# Patient Record
Sex: Male | Born: 1970 | Race: White | Hispanic: No | Smoking: Current every day smoker
Health system: Southern US, Community
[De-identification: ages and names within clinical notes are randomized; demographics above are authoritative.]

## PROBLEM LIST (undated history)

## (undated) DIAGNOSIS — F419 Anxiety disorder, unspecified: Secondary | ICD-10-CM

## (undated) DIAGNOSIS — I2510 Atherosclerotic heart disease of native coronary artery without angina pectoris: Secondary | ICD-10-CM

## (undated) DIAGNOSIS — E785 Hyperlipidemia, unspecified: Secondary | ICD-10-CM

## (undated) DIAGNOSIS — I10 Essential (primary) hypertension: Secondary | ICD-10-CM

## (undated) DIAGNOSIS — K219 Gastro-esophageal reflux disease without esophagitis: Secondary | ICD-10-CM

## (undated) DIAGNOSIS — I219 Acute myocardial infarction, unspecified: Secondary | ICD-10-CM

## (undated) HISTORY — DX: Essential (primary) hypertension: I10

## (undated) HISTORY — DX: Hyperlipidemia, unspecified: E78.5

## (undated) HISTORY — DX: Acute myocardial infarction, unspecified: I21.9

## (undated) HISTORY — DX: Atherosclerotic heart disease of native coronary artery without angina pectoris: I25.10

## (undated) HISTORY — PX: CARDIAC CATHETERIZATION: SHX172

## (undated) HISTORY — PX: HEMORRHOID SURGERY: SHX153

---

## 1997-12-23 ENCOUNTER — Ambulatory Visit (HOSPITAL_COMMUNITY): Admission: RE | Admit: 1997-12-23 | Discharge: 1997-12-23 | Payer: Self-pay | Admitting: Surgery

## 2001-07-21 ENCOUNTER — Ambulatory Visit (HOSPITAL_COMMUNITY): Admission: RE | Admit: 2001-07-21 | Discharge: 2001-07-21 | Payer: Self-pay | Admitting: Family Medicine

## 2004-06-22 ENCOUNTER — Ambulatory Visit: Payer: Self-pay | Admitting: Family Medicine

## 2004-06-23 ENCOUNTER — Ambulatory Visit: Payer: Self-pay | Admitting: Family Medicine

## 2004-11-06 ENCOUNTER — Ambulatory Visit: Payer: Self-pay | Admitting: Family Medicine

## 2004-12-14 ENCOUNTER — Ambulatory Visit: Payer: Self-pay | Admitting: Family Medicine

## 2004-12-29 ENCOUNTER — Ambulatory Visit: Payer: Self-pay

## 2005-04-16 ENCOUNTER — Ambulatory Visit: Payer: Self-pay | Admitting: Internal Medicine

## 2005-05-06 ENCOUNTER — Ambulatory Visit: Payer: Self-pay | Admitting: Family Medicine

## 2005-05-31 ENCOUNTER — Ambulatory Visit: Payer: Self-pay | Admitting: Family Medicine

## 2005-06-07 ENCOUNTER — Ambulatory Visit: Payer: Self-pay | Admitting: Gastroenterology

## 2005-06-28 ENCOUNTER — Ambulatory Visit: Payer: Self-pay | Admitting: Gastroenterology

## 2005-06-28 ENCOUNTER — Ambulatory Visit (HOSPITAL_COMMUNITY): Admission: RE | Admit: 2005-06-28 | Discharge: 2005-06-28 | Payer: Self-pay | Admitting: Gastroenterology

## 2005-08-18 ENCOUNTER — Ambulatory Visit: Payer: Self-pay | Admitting: Gastroenterology

## 2005-10-07 ENCOUNTER — Ambulatory Visit: Payer: Self-pay | Admitting: Family Medicine

## 2005-10-21 ENCOUNTER — Ambulatory Visit: Payer: Self-pay | Admitting: Family Medicine

## 2005-11-11 ENCOUNTER — Ambulatory Visit: Payer: Self-pay | Admitting: Family Medicine

## 2005-12-21 ENCOUNTER — Ambulatory Visit: Payer: Self-pay | Admitting: Family Medicine

## 2006-01-11 ENCOUNTER — Ambulatory Visit: Payer: Self-pay | Admitting: Family Medicine

## 2006-02-01 ENCOUNTER — Ambulatory Visit: Payer: Self-pay | Admitting: Family Medicine

## 2006-05-23 ENCOUNTER — Ambulatory Visit: Payer: Self-pay | Admitting: Family Medicine

## 2006-05-23 LAB — CONVERTED CEMR LAB
Alkaline Phosphatase: 70 units/L (ref 39–117)
BUN: 11 mg/dL (ref 6–23)
CO2: 29 meq/L (ref 19–32)
Calcium: 9.6 mg/dL (ref 8.4–10.5)
Cholesterol: 217 mg/dL (ref 0–200)
Direct LDL: 138.2 mg/dL
GFR calc non Af Amer: 73 mL/min
HDL: 44.1 mg/dL (ref >39.0)
Total Protein: 6.5 g/dL (ref 6.0–8.3)
Triglycerides: 264 mg/dL (ref 0–149)

## 2006-12-05 ENCOUNTER — Encounter: Payer: Self-pay | Admitting: Family Medicine

## 2007-08-29 ENCOUNTER — Telehealth (INDEPENDENT_AMBULATORY_CARE_PROVIDER_SITE_OTHER): Payer: Self-pay | Admitting: *Deleted

## 2008-01-16 ENCOUNTER — Telehealth (INDEPENDENT_AMBULATORY_CARE_PROVIDER_SITE_OTHER): Payer: Self-pay | Admitting: *Deleted

## 2008-01-31 ENCOUNTER — Telehealth (INDEPENDENT_AMBULATORY_CARE_PROVIDER_SITE_OTHER): Payer: Self-pay | Admitting: *Deleted

## 2008-02-13 ENCOUNTER — Encounter: Payer: Self-pay | Admitting: Family Medicine

## 2008-02-14 ENCOUNTER — Telehealth (INDEPENDENT_AMBULATORY_CARE_PROVIDER_SITE_OTHER): Payer: Self-pay | Admitting: *Deleted

## 2009-08-28 ENCOUNTER — Encounter: Payer: Self-pay | Admitting: Gastroenterology

## 2010-06-02 NOTE — Procedures (Signed)
Summary: Endoscopic Ultrasound  Patient: [name] Note: All result statuses are Final unless otherwise noted.  Tests: (1) Endoscopic Ultrasound (EUS)  EUS Endoscopic Ultrasound                             DONE     [facility]     [address] [place], [place]  [address]           ENDOSCOPIC ULTRASOUND PROCEDURE REPORT           PATIENT:  Zachary [name], Zachary [name]  MR#:  [id number]     BIRTHDATE:  07/17/1952  GENDER:  male     ENDOSCOPIST:  Rachael Fee, MD     REFERRED BY:  Juline Patch, M.D.           PROCEDURE DATE:  08/28/2009     PROCEDURE:  Upper EUS     ASA CLASS:  Class II     INDICATIONS:  RUQ pain, dilated CBD on recent US, single small     stone in GB, normal LFTs           MEDICATIONS:   Fentanyl 75 mcg IV, Versed 10 mg IV           DESCRIPTION OF PROCEDURE:   After the risks, benefits, and     alternatives of the procedure were thoroughly explained, informed     consent was obtained.  The  endoscope was introduced through the     mouth  and advanced to the duodenum.     <<PROCEDUREIMAGES>>           Endoscopic findings (with radial echoendoscope and side viewing     duodenoscope):     1. Normal esophagus     2. Normal stomach     3. 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           EUS findings:     1. Dilated but otherwise normal appearing CBD that tapered     smoothly into the head of pancreas.  Maximal dilation 9.58mm on     this exam. There were no stones in bile duct.     2. Normal, non-dilated main pancreatic duct.     3. Pancreatic parenchyma was normal in head, neck, body and tail     without masses or signs of chronic pancreatitis.     4. No peripancreatic or celiac adenopathy.     5. Normal gallbladder.     6. Limited views of liver, spleen, portal and splenic vessels were     all  normal.           Impression:     Dilated, but otherwise normal CBD without stones.  No masses in     pancreas. 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 These could perhaps be causing the     extrahepatic biliary dilation.  Given normal liver tests and lack     of CBD stones, I did not proceed with ERCP.  She does have     intermittent RUQ pains and has a stone in gallbladder on  recent     abdominal ultrasound and so I will arrange for general surgery     evaluation to consider cholecystectomy.           ______________________________     Rachael Fee, MD           n.     eSIGNED:   Rachael Fee at 08/28/2009 11:55 AM           Bud Face, [id number]  Note: An exclamation mark (!) indicates a result that was not dispersed into the flowsheet. Document Creation Date: 08/28/2009 2:58 PM _______________________________________________________________________  (1) Order result status: Final Collection or observation date-time: 08/28/2009 11:43 Requested date-time:  Receipt date-time:  Reported date-time:  Referring Physician:   Ordering Physician: Rob Bunting [phone]) Specimen Source:  Source: Launa Grill Order Number: [id number] Lab site:

## 2013-04-27 ENCOUNTER — Emergency Department: Payer: Self-pay | Admitting: Emergency Medicine

## 2014-09-06 ENCOUNTER — Other Ambulatory Visit: Payer: Self-pay

## 2014-09-06 ENCOUNTER — Encounter: Payer: Self-pay | Admitting: *Deleted

## 2014-09-09 ENCOUNTER — Encounter: Payer: Self-pay | Admitting: *Deleted

## 2014-09-09 DIAGNOSIS — Z79899 Other long term (current) drug therapy: Secondary | ICD-10-CM | POA: Diagnosis not present

## 2014-09-09 DIAGNOSIS — K219 Gastro-esophageal reflux disease without esophagitis: Secondary | ICD-10-CM | POA: Diagnosis not present

## 2014-09-09 DIAGNOSIS — K6289 Other specified diseases of anus and rectum: Secondary | ICD-10-CM | POA: Diagnosis not present

## 2014-09-09 DIAGNOSIS — K644 Residual hemorrhoidal skin tags: Secondary | ICD-10-CM | POA: Diagnosis present

## 2014-09-09 DIAGNOSIS — K648 Other hemorrhoids: Secondary | ICD-10-CM | POA: Diagnosis present

## 2014-09-09 DIAGNOSIS — F329 Major depressive disorder, single episode, unspecified: Secondary | ICD-10-CM | POA: Diagnosis not present

## 2014-09-09 DIAGNOSIS — K625 Hemorrhage of anus and rectum: Secondary | ICD-10-CM | POA: Diagnosis not present

## 2014-09-10 ENCOUNTER — Encounter
Admission: RE | Admit: 2014-09-10 | Discharge: 2014-09-10 | Disposition: A | Discharge status: Home / Self Care | Payer: BLUE CROSS/BLUE SHIELD | Source: Ambulatory Visit | Attending: Surgery | Admitting: Surgery

## 2014-09-10 DIAGNOSIS — K644 Residual hemorrhoidal skin tags: Secondary | ICD-10-CM | POA: Diagnosis not present

## 2014-09-10 LAB — DIFFERENTIAL
Basophils Absolute: 0.1 10*3/uL (ref 0–0.1)
Basophils Relative: 1 %
EOS ABS: 0.1 10*3/uL (ref 0–0.7)
Eosinophils Relative: 1 %
LYMPHS ABS: 2.2 10*3/uL (ref 1.0–3.6)
LYMPHS PCT: 27 %
MONOS PCT: 8 %
Monocytes Absolute: 0.6 10*3/uL (ref 0.2–1.0)
NEUTROS ABS: 5.3 10*3/uL (ref 1.4–6.5)
NEUTROS PCT: 63 %

## 2014-09-10 LAB — BASIC METABOLIC PANEL
ANION GAP: 8 (ref 5–15)
BUN: 16 mg/dL (ref 6–20)
CHLORIDE: 105 mmol/L (ref 101–111)
CO2: 28 mmol/L (ref 22–32)
CREATININE: 1.12 mg/dL (ref 0.61–1.24)
Calcium: 9.5 mg/dL (ref 8.9–10.3)
GFR calc Af Amer: >60 mL/min (ref >60)
GFR calc non Af Amer: >60 mL/min (ref >60)
GLUCOSE: 109 mg/dL — AB (ref 65–99)
Potassium: 4.2 mmol/L (ref 3.5–5.1)
Sodium: 141 mmol/L (ref 135–145)

## 2014-09-10 LAB — CBC
HEMATOCRIT: 39.8 % — AB (ref 40.0–52.0)
Hemoglobin: 12.9 g/dL — ABNORMAL LOW (ref 13.0–18.0)
MCH: 26.7 pg (ref 26.0–34.0)
MCHC: 32.5 g/dL (ref 32.0–36.0)
MCV: 82 fL (ref 80.0–100.0)
Platelets: 278 10*3/uL (ref 150–440)
RBC: 4.85 MIL/uL (ref 4.40–5.90)
RDW: 16.1 % — ABNORMAL HIGH (ref 11.5–14.5)
WBC: 8.3 10*3/uL (ref 3.8–10.6)

## 2014-09-13 ENCOUNTER — Ambulatory Visit: Payer: BLUE CROSS/BLUE SHIELD | Admitting: Anesthesiology

## 2014-09-13 ENCOUNTER — Encounter
Admission: RE | Disposition: A | Discharge status: Home / Self Care | Payer: Self-pay | Source: Ambulatory Visit | Attending: Surgery

## 2014-09-13 ENCOUNTER — Ambulatory Visit
Admission: RE | Admit: 2014-09-13 | Discharge: 2014-09-13 | Disposition: A | Discharge status: Home / Self Care | Payer: BLUE CROSS/BLUE SHIELD | Source: Ambulatory Visit | Attending: Surgery | Admitting: Surgery

## 2014-09-13 DIAGNOSIS — K644 Residual hemorrhoidal skin tags: Secondary | ICD-10-CM | POA: Diagnosis not present

## 2014-09-13 DIAGNOSIS — K219 Gastro-esophageal reflux disease without esophagitis: Secondary | ICD-10-CM | POA: Insufficient documentation

## 2014-09-13 DIAGNOSIS — K6289 Other specified diseases of anus and rectum: Secondary | ICD-10-CM | POA: Insufficient documentation

## 2014-09-13 DIAGNOSIS — K625 Hemorrhage of anus and rectum: Secondary | ICD-10-CM | POA: Insufficient documentation

## 2014-09-13 DIAGNOSIS — Z79899 Other long term (current) drug therapy: Secondary | ICD-10-CM | POA: Insufficient documentation

## 2014-09-13 DIAGNOSIS — F329 Major depressive disorder, single episode, unspecified: Secondary | ICD-10-CM | POA: Insufficient documentation

## 2014-09-13 DIAGNOSIS — K648 Other hemorrhoids: Secondary | ICD-10-CM | POA: Insufficient documentation

## 2014-09-13 HISTORY — PX: HEMORRHOID SURGERY: SHX153

## 2014-09-13 HISTORY — DX: Gastro-esophageal reflux disease without esophagitis: K21.9

## 2014-09-13 HISTORY — DX: Anxiety disorder, unspecified: F41.9

## 2014-09-13 SURGERY — HEMORRHOIDECTOMY
Anesthesia: General | Wound class: Contaminated

## 2014-09-13 MED ORDER — FENTANYL CITRATE (PF) 100 MCG/2ML IJ SOLN
INTRAMUSCULAR | Status: AC
  Administered 2014-09-13: 25 ug via INTRAVENOUS
  Filled 2014-09-13: qty 2

## 2014-09-13 MED ORDER — BUPIVACAINE-EPINEPHRINE (PF) 0.5% -1:200000 IJ SOLN
INTRAMUSCULAR | Status: DC | PRN
  Administered 2014-09-13: 30 mL

## 2014-09-13 MED ORDER — PROPOFOL 10 MG/ML IV BOLUS
INTRAVENOUS | Status: DC | PRN
  Administered 2014-09-13: 200 mg via INTRAVENOUS

## 2014-09-13 MED ORDER — FENTANYL CITRATE (PF) 100 MCG/2ML IJ SOLN
25.0000 ug | INTRAMUSCULAR | Status: AC | PRN
  Administered 2014-09-13 (×4): 25 ug via INTRAVENOUS

## 2014-09-13 MED ORDER — SODIUM CHLORIDE 0.9 % IJ SOLN
INTRAMUSCULAR | Status: AC
  Filled 2014-09-13: qty 10

## 2014-09-13 MED ORDER — HYDROMORPHONE HCL 1 MG/ML IJ SOLN
INTRAMUSCULAR | Status: AC
  Administered 2014-09-13: 0.5 mg via INTRAVENOUS
  Filled 2014-09-13: qty 1

## 2014-09-13 MED ORDER — BUPIVACAINE LIPOSOME 1.3 % IJ SUSP
INTRAMUSCULAR | Status: DC | PRN
  Administered 2014-09-13: 20 mL

## 2014-09-13 MED ORDER — LACTATED RINGERS IV SOLN
INTRAVENOUS | Status: DC
  Administered 2014-09-13 (×2): via INTRAVENOUS

## 2014-09-13 MED ORDER — PROMETHAZINE HCL 25 MG/ML IJ SOLN
INTRAMUSCULAR | Status: AC
Start: 2014-09-13 — End: 2014-09-13
  Administered 2014-09-13: 6.25 mg via INTRAVENOUS
  Filled 2014-09-13: qty 1

## 2014-09-13 MED ORDER — HYDROMORPHONE HCL 1 MG/ML IJ SOLN
0.2500 mg | INTRAMUSCULAR | Status: DC | PRN
  Administered 2014-09-13 (×4): 0.5 mg via INTRAVENOUS

## 2014-09-13 MED ORDER — ONDANSETRON HCL 4 MG/2ML IJ SOLN: 4.0000 mg | Freq: Once | INTRAMUSCULAR | Status: DC | PRN

## 2014-09-13 MED ORDER — PROMETHAZINE HCL 25 MG/ML IJ SOLN
INTRAMUSCULAR | Status: AC
  Filled 2014-09-13: qty 1

## 2014-09-13 MED ORDER — PROMETHAZINE HCL 25 MG/ML IJ SOLN
6.2500 mg | Freq: Once | INTRAMUSCULAR | Status: AC
  Administered 2014-09-13 (×2): 6.25 mg via INTRAVENOUS

## 2014-09-13 MED ORDER — GLYCOPYRROLATE 0.2 MG/ML IJ SOLN
INTRAMUSCULAR | Status: DC | PRN
  Administered 2014-09-13: 0.2 mg via INTRAVENOUS

## 2014-09-13 MED ORDER — LIDOCAINE HCL (CARDIAC) 20 MG/ML IV SOLN
INTRAVENOUS | Status: DC | PRN
  Administered 2014-09-13: 60 mg via INTRAVENOUS

## 2014-09-13 MED ORDER — OXYCODONE-ACETAMINOPHEN 5-325 MG PO TABS: 1.0000 | ORAL_TABLET | ORAL | Status: DC | PRN

## 2014-09-13 MED ORDER — ONDANSETRON HCL 4 MG/2ML IJ SOLN
INTRAMUSCULAR | Status: DC | PRN
  Administered 2014-09-13: 4 mg via INTRAVENOUS

## 2014-09-13 MED ORDER — ONDANSETRON HCL 4 MG/2ML IJ SOLN
INTRAMUSCULAR | Status: AC
  Filled 2014-09-13: qty 2

## 2014-09-13 MED ORDER — FENTANYL CITRATE (PF) 100 MCG/2ML IJ SOLN
INTRAMUSCULAR | Status: DC | PRN
  Administered 2014-09-13: 50 ug via INTRAVENOUS
  Administered 2014-09-13 (×2): 25 ug via INTRAVENOUS

## 2014-09-13 MED ORDER — BUPIVACAINE LIPOSOME 1.3 % IJ SUSP
INTRAMUSCULAR | Status: AC
  Filled 2014-09-13: qty 20

## 2014-09-13 MED ORDER — BUPIVACAINE-EPINEPHRINE (PF) 0.5% -1:200000 IJ SOLN
INTRAMUSCULAR | Status: AC
  Filled 2014-09-13: qty 30

## 2014-09-13 MED ORDER — MIDAZOLAM HCL 2 MG/2ML IJ SOLN
INTRAMUSCULAR | Status: DC | PRN
Start: 2014-09-13 — End: 2014-09-13
  Administered 2014-09-13: 2 mg via INTRAVENOUS

## 2014-09-13 SURGICAL SUPPLY — 30 items
BLADE SURG 15 STRL LF DISP TIS (BLADE) ×1 IMPLANT
BLADE SURG 15 STRL SS (BLADE) ×2
CANISTER SUCT 1200ML W/VALVE (MISCELLANEOUS) ×3 IMPLANT
CNTNR SPEC 2.5X3XGRAD LEK (MISCELLANEOUS) ×1
CONT SPEC 4OZ STER OR WHT (MISCELLANEOUS) ×2
CONTAINER SPEC 2.5X3XGRAD LEK (MISCELLANEOUS) ×1 IMPLANT
DRAPE LAPAROTOMY 100X77 ABD (DRAPES) ×3 IMPLANT
DRAPE LEGGINS SURG 28X43 STRL (DRAPES) ×3 IMPLANT
DRAPE UNDER BUTTOCK W/FLU (DRAPES) ×3 IMPLANT
GAUZE SPONGE 4X4 12PLY STRL (GAUZE/BANDAGES/DRESSINGS) ×3 IMPLANT
GLOVE BIO SURGEON STRL SZ7.5 (GLOVE) ×9 IMPLANT
GOWN STRL REUS W/ TWL LRG LVL3 (GOWN DISPOSABLE) ×2 IMPLANT
GOWN STRL REUS W/TWL LRG LVL3 (GOWN DISPOSABLE) ×4
HARMONIC SCALPEL FOCUS (MISCELLANEOUS) ×3 IMPLANT
JELLY LUB 2OZ STRL (MISCELLANEOUS) ×2
JELLY LUBE 2OZ STRL (MISCELLANEOUS) ×1 IMPLANT
LABEL OR SOLS (LABEL) ×3 IMPLANT
NDL SAFETY 25GX1.5 (NEEDLE) ×3 IMPLANT
NS IRRIG 500ML POUR BTL (IV SOLUTION) ×3 IMPLANT
PACK BASIN MINOR ARMC (MISCELLANEOUS) ×3 IMPLANT
PAD GROUND ADULT SPLIT (MISCELLANEOUS) ×3 IMPLANT
PAD PREP 24X41 OB/GYN DISP (PERSONAL CARE ITEMS) ×3 IMPLANT
SOL PREP PVP 2OZ (MISCELLANEOUS) ×3
SOLUTION PREP PVP 2OZ (MISCELLANEOUS) ×1 IMPLANT
STAPLER PROXIMATE HCS (STAPLE) IMPLANT
STRAP SAFETY BODY (MISCELLANEOUS) ×3 IMPLANT
SUT CHROMIC 2 0 SH (SUTURE) ×9 IMPLANT
SUT CHROMIC 3 0 SH 27 (SUTURE) ×3 IMPLANT
SYRINGE 10CC LL (SYRINGE) ×3 IMPLANT
TAPE CLOTH SOFT 2X10 (GAUZE/BANDAGES/DRESSINGS) ×3 IMPLANT

## 2014-09-13 NOTE — Anesthesia Preprocedure Evaluation (Signed)
Anesthesia Evaluation  Patient identified by MRN, date of birth, ID band Patient awake    Reviewed: Allergy & Precautions, H&P , NPO status   Airway Mallampati: III  TM Distance: >3 FB     Dental  (+) Chipped   Pulmonary          Cardiovascular     Neuro/Psych    GI/Hepatic GERD-  ,  Endo/Other  Morbid obesity  Renal/GU      Musculoskeletal   Abdominal   Peds  Hematology   Anesthesia Other Findings   Reproductive/Obstetrics                             Anesthesia Physical Anesthesia Plan  ASA: III  Anesthesia Plan: General   Post-op Pain Management:    Induction: Intravenous  Airway Management Planned: LMA  Additional Equipment:   Intra-op Plan:   Post-operative Plan:   Informed Consent: I have reviewed the patients History and Physical, chart, labs and discussed the procedure including the risks, benefits and alternatives for the proposed anesthesia with the patient or authorized representative who has indicated his/her understanding and acceptance.     Plan Discussed with: CRNA  Anesthesia Plan Comments:         Anesthesia Quick Evaluation

## 2014-09-13 NOTE — Transfer of Care (Signed)
Immediate Anesthesia Transfer of Care Note  Patient: Zachary [name]  Procedure(s) Performed: Procedure(s): HEMORRHOIDECTOMY (N/A)  Patient Location: PACU  Anesthesia Type:General  Level of Consciousness: awake, alert  and oriented  Airway & Oxygen Therapy: Patient Spontanous Breathing  Post-op Assessment: Report given to RN  Post vital signs: stable  Last Vitals:  Filed Vitals:   09/13/14 1146  BP: 121/86  Pulse: 85  Temp: 36.4 C  Resp: 11    Complications: No apparent anesthesia complications

## 2014-09-13 NOTE — Anesthesia Procedure Notes (Signed)
Procedure Name: LMA Insertion Performed by: Breckyn Troyer Pre-anesthesia Checklist: Patient identified Oxygen Delivery Method: Circle system utilized Preoxygenation: Pre-oxygenation with 100% oxygen Intubation Type: IV induction Ventilation: Mask ventilation without difficulty LMA Size: 4.5 Number of attempts: 1 Placement Confirmation: positive ETCO2 and breath sounds checked- equal and bilateral Tube secured with: Tape

## 2014-09-13 NOTE — Op Note (Signed)
Operative report  Preoperative diagnosis internal and external hemorrhoids  Postoperative diagnosis internal and external hemorrhoids  Procedure internal and external hemorrhoidectomy  Anesthesia General  Surgeon Renda RollsWilton Kayra Crowell M.D.  Indications: This 44 year old male has a history of anal pain and rectal bleeding. Large internal and external hemorrhoids were demonstrated on physical exam and surgery was recommended for definitive treatment  The patient was placed on the operating table in the supine position under general anesthesia. The legs were elevated into the lithotomy position using ankle straps. The scrotum was retracted with paper tape. Anal area was prepared with Betadine solution and draped in a sterile manner  Large external hemorrhoids were identified at the 4:00 and 8:00 and 11:00 positions. The anoderm was infiltrated with half percent Sensorcaine with epinephrine. Deeper tissues surrounding the sphincter were also infiltrated. The anal canal was dilated large enough to admit 4 fingers. The bivalve anal retractor was introduced. There was also findings of very large internal hemorrhoids at the 4:00 and 8:00 and 11:00 positions.  First at the 4:00 position a high ligation of the internal component was done with a 2-0 chromic suture ligature. A V-shaped incision was made externally and dissection was begun with electrocautery. Further dissection was carried out with the Harmonic scalpel dissecting up over the internal anal sphincter up to the previously placed suture ligature. The hemorrhoid was further ligated with the same suture ligature and excised. The wound was inspected and several small bleeding points cauterized. Wound closure was carried out with a running 2-0 chromic locked and tied  stitch leaving a small opening externally for drainage.  Next a similar procedure was carried out at the 8:00 position with a similar 2-0 chromic suture ligature a similar excision with the  scalpel cautery and Harmonic scalpel. The hemorrhoid was further ligated and excised. The wound was closed in a similar manner leaving a small opening externally for drainage.  A similar procedure was carried out at the 11:00 position with a similar dissection and ligation and closure.  Following this it appeared that hemostasis was intact. The anal retractor was removed and the anal area was prepared with Betadine solution. The skin and subcutaneous tissues were infiltrated with 20 cc of Exparel. Deeper tissues surrounding the sphincter were also infiltrated.Dressings were applied with paper tape  The patient appeared to be in satisfactory condition and was prepared for transfer to the recovery room    Renda RollsWilton Kaydee Magel M.D.

## 2014-09-13 NOTE — Discharge Instructions (Addendum)
Tylenol 325 mg 1-2 each 4 hours as needed for minor pain.  Take Percocet 1-2 each 4 hours as needed for more pain.  Remove dressing tomorrow. May then shower and/or sitting in warm water.  Tuck gauze or pad in underwear as needed for drainage  Gradually increase activities. Should not drive when taking Percocet.  Take MiraLAX daily

## 2014-09-13 NOTE — Transfer of Care (Signed)
Immediate Anesthesia Transfer of Care Note  Patient: Zachary [name]  Procedure(s) Performed: Procedure(s): HEMORRHOIDECTOMY (N/A)  Patient Location: PACU  Anesthesia Type:General  Level of Consciousness: awake, alert  and oriented  Airway & Oxygen Therapy: Patient Spontanous Breathing  Post-op Assessment: Report given to RN  Post vital signs: stable  Last Vitals:  Filed Vitals:   09/13/14 0918  BP: 147/75  Pulse: 110  Temp: 36.4 C  Resp:     Complications: No apparent anesthesia complications

## 2014-09-13 NOTE — H&P (Signed)
  09/13/2014  Since the office visit he has taken lisinopril. He did develop a cough and switched to another blood pressure medicine 2 days ago.  His blood pressure today is 147/75.  He reports no other changes in condition since the office visit  Plan is for hemorrhoidectomy today.

## 2014-09-13 NOTE — Anesthesia Postprocedure Evaluation (Signed)
  Anesthesia Post-op Note  Patient: Zachary [name]  Procedure(s) Performed: Procedure(s): HEMORRHOIDECTOMY (N/A)  Anesthesia type:General  Patient location: PACU  Post pain: Pain level controlled  Post assessment: Post-op Vital signs reviewed, Patient's Cardiovascular Status Stable, Respiratory Function Stable, Patent Airway and No signs of Nausea or vomiting  Post vital signs: Reviewed and stable  Last Vitals:  Filed Vitals:   09/13/14 1248  BP: 132/87  Pulse: 61  Temp: 36.7 C  Resp: 13    Level of consciousness: awake, alert  and patient cooperative  Complications: No apparent anesthesia complications

## 2014-09-15 ENCOUNTER — Encounter: Payer: Self-pay | Admitting: Surgery

## 2014-09-16 LAB — SURGICAL PATHOLOGY

## 2015-04-20 IMAGING — CR RIGHT FOOT COMPLETE - 3+ VIEW
1 series · 3 of 3 positions shown · non-contrast
Comparison: None.

CLINICAL DATA: No known injury.  Right foot pain.

EXAM:
RIGHT FOOT COMPLETE - 3+ VIEW

[Series 1: x foot ap right · 0.14mm/px · 3 of 3 slices shown]
[im 1/3]
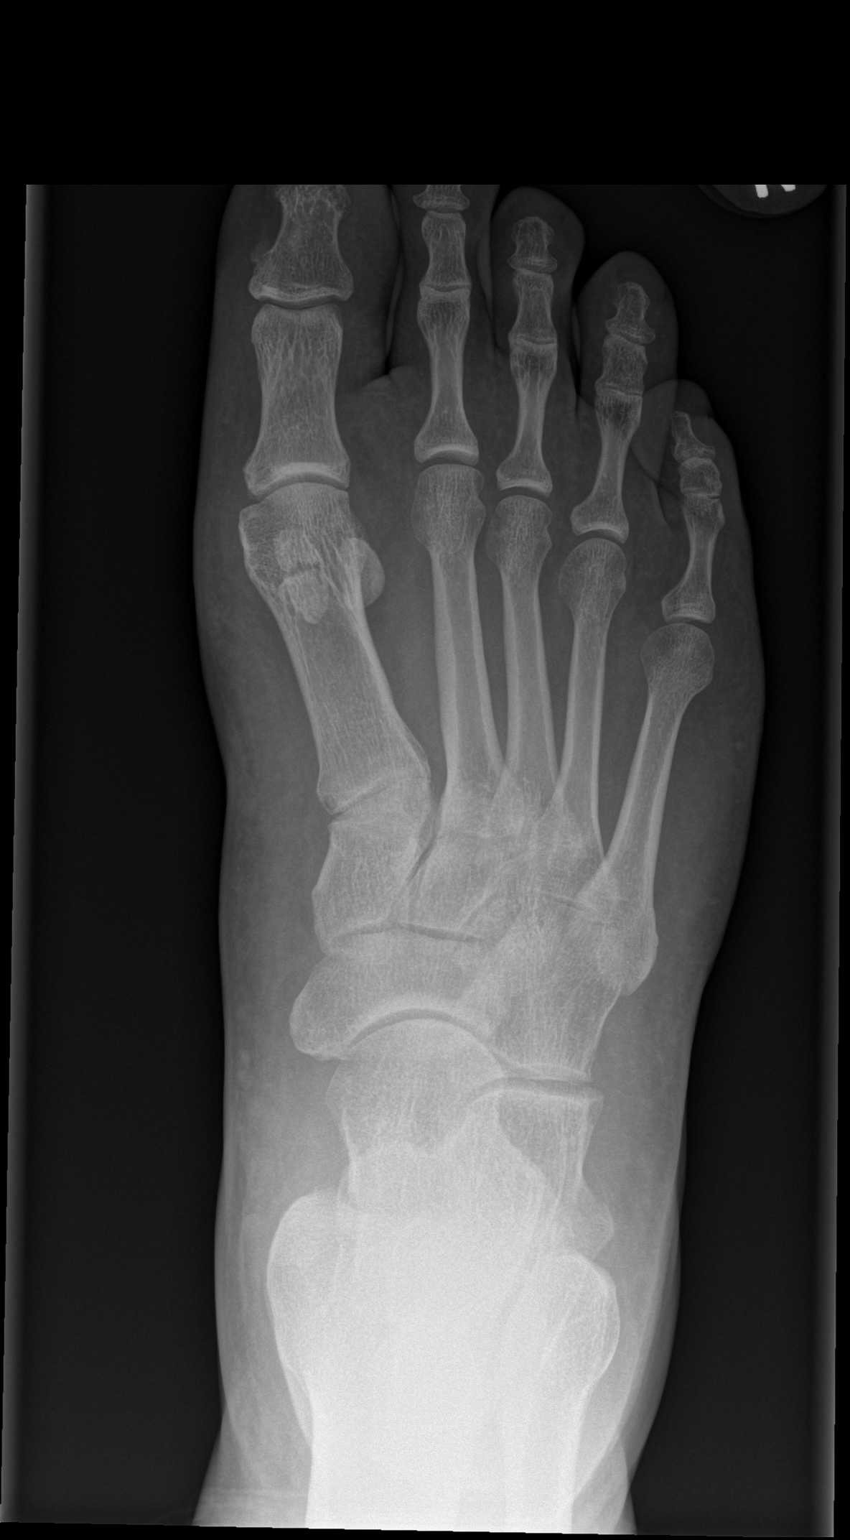
[im 2/3]
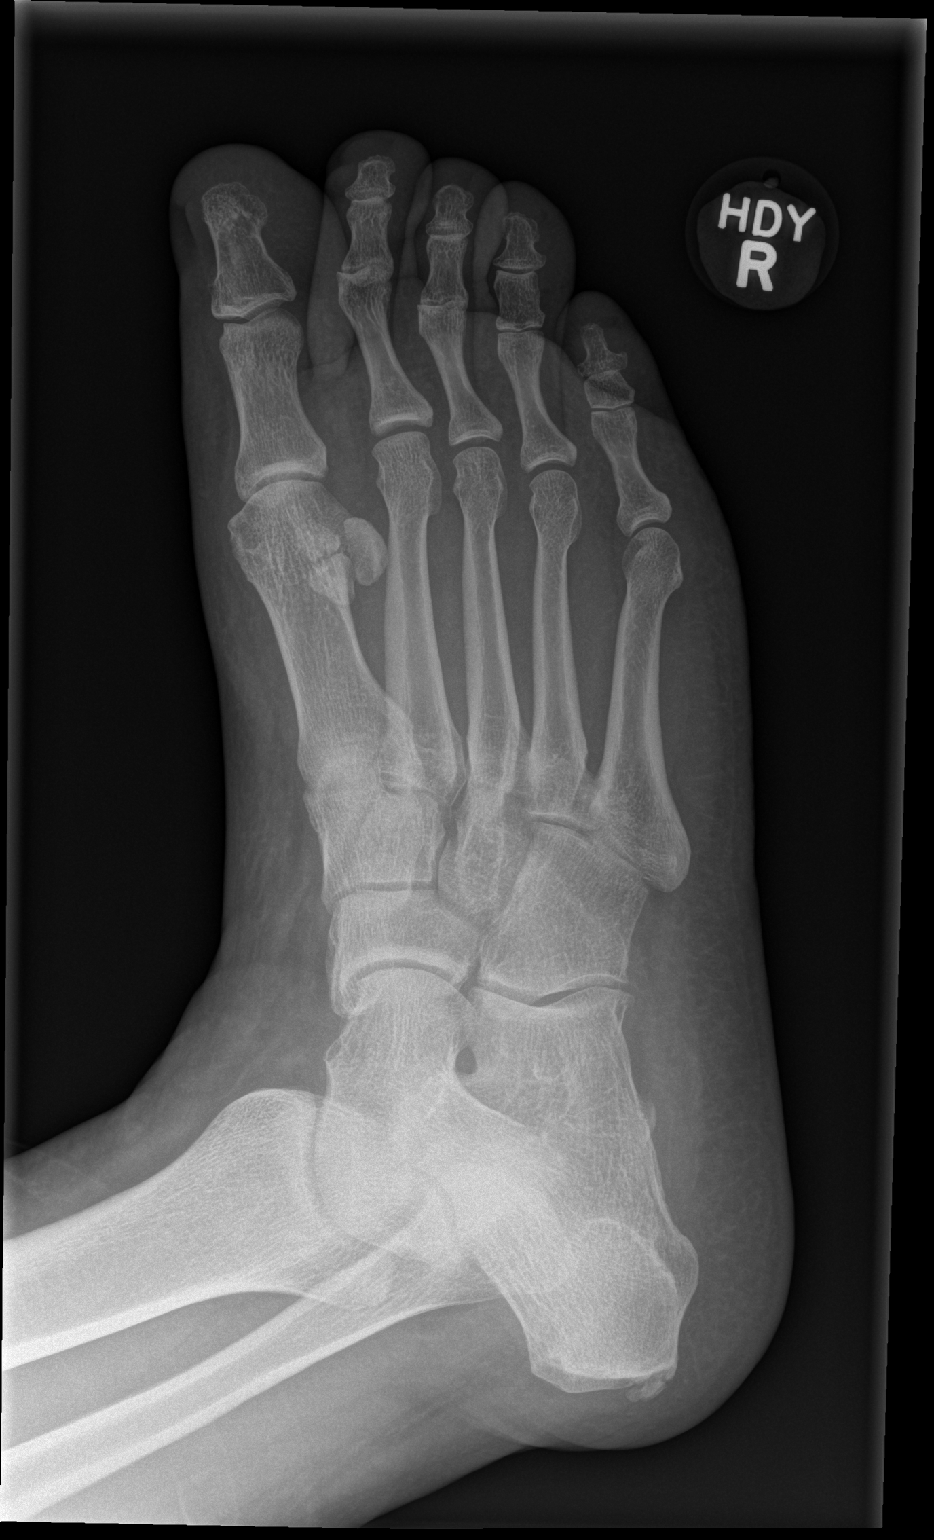
[im 3/3]
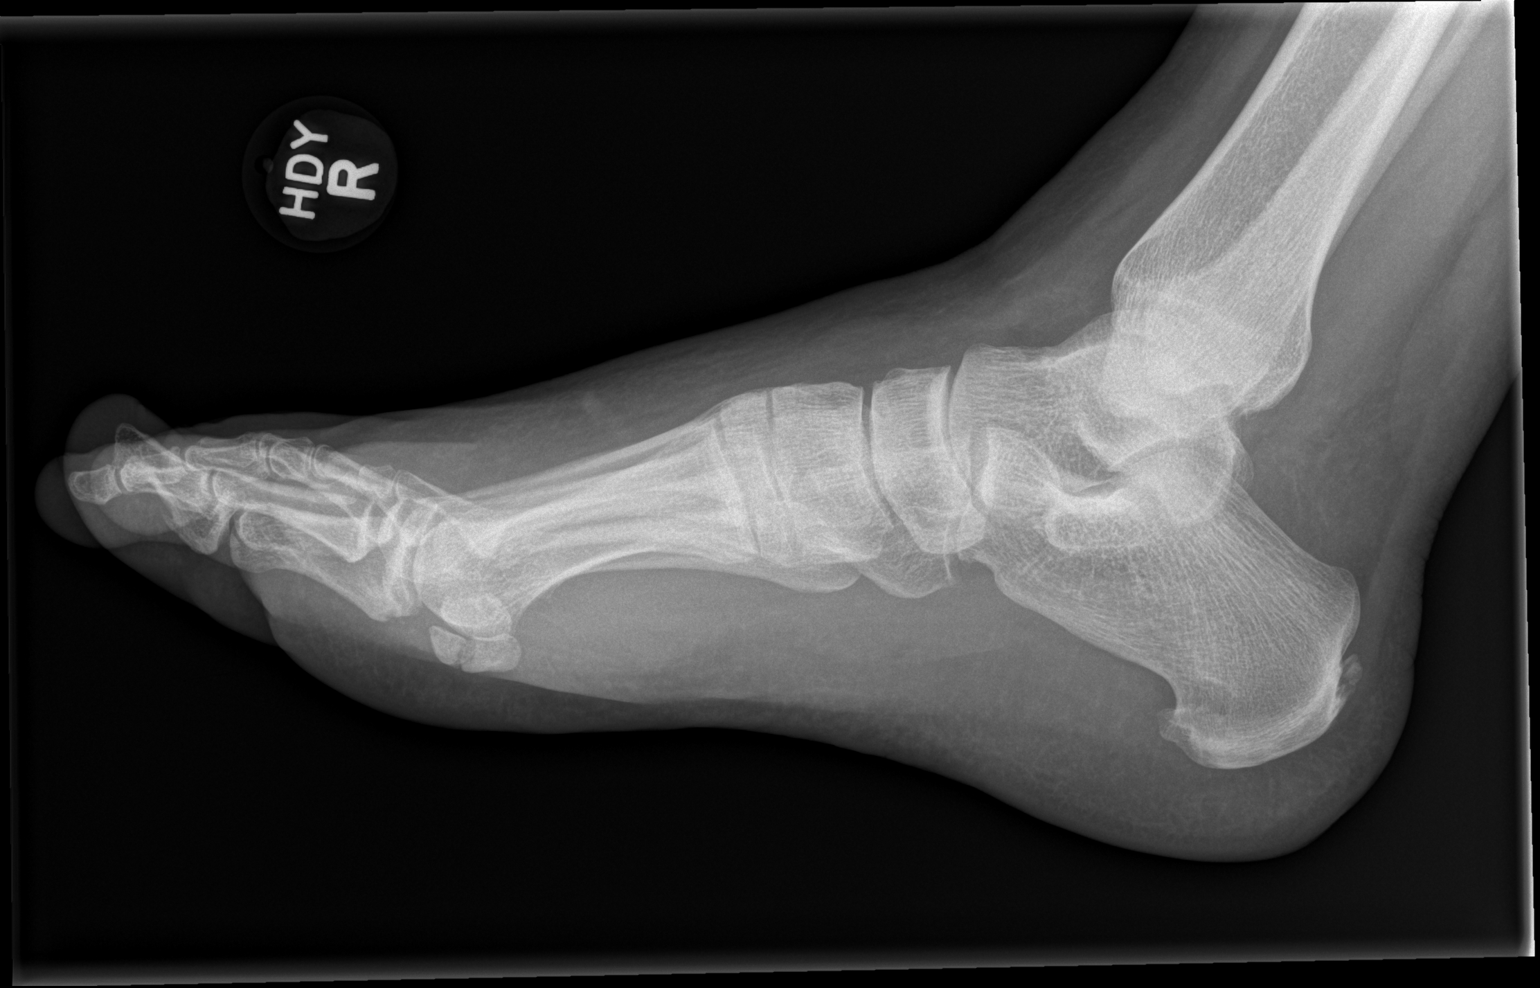

[3 of 3 positions shown; findings below may reference images not displayed]

FINDINGS: Mild dorsal soft tissue swelling also along the 1st MTP joint. No
worrisome osseous lesion. No fracture or dislocation. No erosive
arthropathy. No calcifications. No foreign body.
IMPRESSION: Soft tissue swelling.  No fracture or erosive lesion is identified.

## 2018-09-15 ENCOUNTER — Encounter: Payer: Self-pay | Admitting: Podiatry

## 2018-09-15 ENCOUNTER — Other Ambulatory Visit: Payer: Self-pay

## 2018-09-15 ENCOUNTER — Ambulatory Visit: Payer: BLUE CROSS/BLUE SHIELD | Admitting: Podiatry

## 2018-09-15 VITALS — BP 146/88 | HR 74 | Temp 97.5°F

## 2018-09-15 DIAGNOSIS — M79676 Pain in unspecified toe(s): Secondary | ICD-10-CM

## 2018-09-15 DIAGNOSIS — K649 Unspecified hemorrhoids: Secondary | ICD-10-CM | POA: Insufficient documentation

## 2018-09-15 DIAGNOSIS — L600 Ingrowing nail: Secondary | ICD-10-CM | POA: Diagnosis not present

## 2018-09-15 DIAGNOSIS — E669 Obesity, unspecified: Secondary | ICD-10-CM | POA: Insufficient documentation

## 2018-09-15 DIAGNOSIS — K219 Gastro-esophageal reflux disease without esophagitis: Secondary | ICD-10-CM | POA: Insufficient documentation

## 2018-09-15 MED ORDER — NEOMYCIN-POLYMYXIN-HC 3.5-10000-1 OT SOLN: OTIC | 0 refills | Status: DC

## 2018-09-15 NOTE — Progress Notes (Signed)
  Subjective:  Patient ID: Zachary [name], male    DOB: Feb 10, 1971,  MRN: [id number]  Chief Complaint  Patient presents with  . Nail Problem    Right 1st lateral border ingrown nail. Pt states 1wk duration, some pus drainage. Pt denies fever/nausea/vomiting/chills.   48 y.o. male presents with the above complaint. Hx confirmed with patient.  Review of Systems: Negative except as noted in the HPI. Denies N/V/F/Ch.  Past Medical History:  Diagnosis Date  . Anxiety   . GERD (gastroesophageal reflux disease)     Current Outpatient Medications:  .  colchicine 0.6 MG tablet, , Disp: , Rfl:  .  escitalopram (LEXAPRO) 20 MG tablet, Take 20 mg by mouth at bedtime., Disp: , Rfl:  .  esomeprazole (NEXIUM) 20 MG capsule, Take 20 mg by mouth 2 (two) times daily., Disp: , Rfl:  .  indomethacin (INDOCIN) 25 MG capsule, , Disp: , Rfl:  .  lisinopril (ZESTRIL) 10 MG tablet, , Disp: , Rfl:  .  losartan (COZAAR) 50 MG tablet, Take 50 mg by mouth daily., Disp: , Rfl:  .  Melatonin 5 MG CAPS, Take 1 capsule by mouth at bedtime., Disp: , Rfl:  .  predniSONE (DELTASONE) 10 MG tablet, , Disp: , Rfl:   Social History   Tobacco Use  Smoking Status Never Smoker    No Known Allergies Objective:   Vitals:   09/15/18 1304  BP: (!) 146/88   There is no height or weight on file to calculate BMI. Constitutional Well developed. Well nourished.  Vascular Dorsalis pedis pulses palpable bilaterally. Posterior tibial pulses palpable bilaterally. Capillary refill normal to all digits.  No cyanosis or clubbing noted. Pedal hair growth normal.  Neurologic Normal speech. Oriented to person, place, and time. Epicritic sensation to light touch grossly present bilaterally.  Dermatologic Painful ingrowing nail at lateral nail borders of the hallux nail right. No other open wounds. No skin lesions.  Orthopedic: Normal joint ROM without pain or crepitus bilaterally. No visible deformities. No bony  tenderness.   Radiographs: None Assessment:   1. Ingrown nail   2. Pain around toenail    Plan:  Patient was evaluated and treated and all questions answered.  Ingrown Nail, right -Patient elects to proceed with minor surgery to remove ingrown toenail removal today. Consent reviewed and signed by patient. -Ingrown nail excised. See procedure note. -Educated on post-procedure care including soaking. Written instructions provided and reviewed. -Patient to follow up in 2 weeks for nail check.  Procedure: Excision of Ingrown Toenail Location: Right 1st lateral nail borders. Anesthesia: Lidocaine 1% plain; 1.5 mL and Marcaine 0.5% plain; 1.5 mL, digital block. Skin Prep: Betadine. Dressing: Silvadene; telfa; dry, sterile, compression dressing. Technique: Following skin prep, the toe was exsanguinated and a tourniquet was secured at the base of the toe. The affected nail border was freed, split with a nail splitter, and excised. Chemical matrixectomy was then performed with phenol and irrigated out with alcohol. The tourniquet was then removed and sterile dressing applied. Disposition: Patient tolerated procedure well. Patient to return in 2 weeks for follow-up.   No follow-ups on file.

## 2018-09-15 NOTE — Patient Instructions (Signed)

## 2018-09-28 ENCOUNTER — Telehealth (INDEPENDENT_AMBULATORY_CARE_PROVIDER_SITE_OTHER): Payer: BLUE CROSS/BLUE SHIELD | Admitting: Podiatry

## 2018-09-28 DIAGNOSIS — L600 Ingrowing nail: Secondary | ICD-10-CM

## 2018-09-28 NOTE — Progress Notes (Signed)
Virtual Visit via Video Note  I connected with Zachary [name] on 09/28/18 at 11:45 AM EDT by a video enabled telemedicine application and verified that I am speaking with the correct person using two identifiers.  Location: Patient: Home Provider: [facility]    I discussed the limitations of evaluation and management by telemedicine and the availability of in person appointments. The patient expressed understanding and agreed to proceed.  History of Present Illness: States the right great toe is doing well denies soreness pus drainage.  Not still present.  Pain rated 0-10 today   Observations/Objective: Right great toe healing well without drainage.  Slight redness but patient states it does not feel warm to touch.  Assessment and Plan: Right ingrown toenail site healing well.  Follow Up Instructions: Follow-up as needed.  Advised to call if redness does not resolve within 3 weeks.  Advised to call if drainage occurs   I discussed the assessment and treatment plan with the patient. The patient was provided an opportunity to ask questions and all were answered. The patient agreed with the plan and demonstrated an understanding of the instructions.   The patient was advised to call back or seek an in-person evaluation if the symptoms worsen or if the condition fails to improve as anticipated.  I provided 5 minutes of non-face-to-face time during this encounter.   Park Liter, DPM

## 2019-07-20 ENCOUNTER — Encounter (HOSPITAL_BASED_OUTPATIENT_CLINIC_OR_DEPARTMENT_OTHER): Payer: Self-pay | Admitting: *Deleted

## 2019-07-20 ENCOUNTER — Other Ambulatory Visit: Payer: Self-pay

## 2019-07-20 ENCOUNTER — Emergency Department (HOSPITAL_BASED_OUTPATIENT_CLINIC_OR_DEPARTMENT_OTHER)
Admission: EM | Admit: 2019-07-20 | Discharge: 2019-07-21 | Disposition: A | Discharge status: Home / Self Care | Payer: BC Managed Care – PPO | Attending: Emergency Medicine | Admitting: Emergency Medicine

## 2019-07-20 ENCOUNTER — Ambulatory Visit: Payer: Self-pay

## 2019-07-20 ENCOUNTER — Ambulatory Visit: Payer: BC Managed Care – PPO | Attending: Internal Medicine

## 2019-07-20 DIAGNOSIS — Z79899 Other long term (current) drug therapy: Secondary | ICD-10-CM | POA: Insufficient documentation

## 2019-07-20 DIAGNOSIS — Z20822 Contact with and (suspected) exposure to covid-19: Secondary | ICD-10-CM | POA: Diagnosis not present

## 2019-07-20 DIAGNOSIS — R1013 Epigastric pain: Secondary | ICD-10-CM | POA: Diagnosis not present

## 2019-07-20 LAB — CBC WITH DIFFERENTIAL/PLATELET
Abs Immature Granulocytes: 0.11 10*3/uL — ABNORMAL HIGH (ref 0.00–0.07)
Basophils Absolute: 0.1 10*3/uL (ref 0.0–0.1)
Basophils Relative: 0 %
Eosinophils Absolute: 0.1 10*3/uL (ref 0.0–0.5)
Eosinophils Relative: 1 %
HCT: 50.3 % (ref 39.0–52.0)
Hemoglobin: 16.9 g/dL (ref 13.0–17.0)
Immature Granulocytes: 1 %
Lymphocytes Relative: 18 %
Lymphs Abs: 2.8 10*3/uL (ref 0.7–4.0)
MCH: 30 pg (ref 26.0–34.0)
MCHC: 33.6 g/dL (ref 30.0–36.0)
MCV: 89.3 fL (ref 80.0–100.0)
Monocytes Absolute: 1 10*3/uL (ref 0.1–1.0)
Monocytes Relative: 6 %
Neutro Abs: 11.7 10*3/uL — ABNORMAL HIGH (ref 1.7–7.7)
Neutrophils Relative %: 74 %
Platelets: 342 10*3/uL (ref 150–400)
RBC: 5.63 MIL/uL (ref 4.22–5.81)
RDW: 13.4 % (ref 11.5–15.5)
WBC: 15.8 10*3/uL — ABNORMAL HIGH (ref 4.0–10.5)
nRBC: 0 % (ref 0.0–0.2)

## 2019-07-20 LAB — COMPREHENSIVE METABOLIC PANEL
ALT: 30 U/L (ref 0–44)
AST: 27 U/L (ref 15–41)
Albumin: 4.3 g/dL (ref 3.5–5.0)
Alkaline Phosphatase: 88 U/L (ref 38–126)
Anion gap: 10 (ref 5–15)
BUN: 16 mg/dL (ref 6–20)
CO2: 27 mmol/L (ref 22–32)
Calcium: 11.1 mg/dL — ABNORMAL HIGH (ref 8.9–10.3)
Chloride: 99 mmol/L (ref 98–111)
Creatinine, Ser: 1.03 mg/dL (ref 0.61–1.24)
GFR calc Af Amer: >60 mL/min (ref >60)
GFR calc non Af Amer: >60 mL/min (ref >60)
Glucose, Bld: 109 mg/dL — ABNORMAL HIGH (ref 70–99)
Potassium: 4.1 mmol/L (ref 3.5–5.1)
Sodium: 136 mmol/L (ref 135–145)
Total Bilirubin: 0.7 mg/dL (ref 0.3–1.2)
Total Protein: 7.7 g/dL (ref 6.5–8.1)

## 2019-07-20 LAB — LIPASE, BLOOD: Lipase: 31 U/L (ref 11–51)

## 2019-07-20 MED ORDER — ALUM & MAG HYDROXIDE-SIMETH 200-200-20 MG/5ML PO SUSP
30.0000 mL | Freq: Once | ORAL | Status: AC
  Administered 2019-07-20: 30 mL via ORAL
  Filled 2019-07-20: qty 30

## 2019-07-20 MED ORDER — LIDOCAINE VISCOUS HCL 2 % MT SOLN
15.0000 mL | Freq: Once | OROMUCOSAL | Status: AC
  Administered 2019-07-20: 15 mL via ORAL
  Filled 2019-07-20: qty 15

## 2019-07-20 MED ORDER — SODIUM CHLORIDE 0.9 % IV BOLUS
1000.0000 mL | Freq: Once | INTRAVENOUS | Status: AC
  Administered 2019-07-20: 1000 mL via INTRAVENOUS

## 2019-07-20 NOTE — ED Triage Notes (Signed)
3 days of GERD. Abdominal pain today. He has been taking Nexium, Omeprazole with no improvement. Worse after eating.

## 2019-07-20 NOTE — ED Provider Notes (Signed)
[facility] EMERGENCY DEPARTMENT Provider Note  CSN: [id number] Arrival date & time: 07/20/19 2006  Chief Complaint(s) Abdominal Pain  HPI Zachary [name] is a 49 y.o. male with a history of GERD on Nexium who presents to the emergency department with recurrent epigastric abdominal discomfort for the past 4 days.  This typically occurs 30 minutes postprandial.  He endorses some mild nausea without emesis.  However he did have one bout of emesis just prior to my evaluation.  This was nonbloody nonbilious.  He denies any recent fevers or infections.  No chest pain or shortness of breath.  No diarrhea.  Pain is currently improved following emesis.  No other alleviating or aggravating factors. No known sick contacts.  No suspicious food intake HPI  Past Medical History Past Medical History:  Diagnosis Date  . Anxiety   . GERD (gastroesophageal reflux disease)    Patient Active Problem List   Diagnosis Date Noted  . GERD (gastroesophageal reflux disease) 09/15/2018  . Hemorrhoids 09/15/2018  . Obesity 09/15/2018   Home Medication(s) Prior to Admission medications   Medication Sig Start Date End Date Taking? Authorizing Provider  escitalopram (LEXAPRO) 20 MG tablet Take 20 mg by mouth at bedtime.   Yes [provider]  esomeprazole (NEXIUM) 20 MG capsule Take 20 mg by mouth 2 (two) times daily.   Yes [provider]  colchicine 0.6 MG tablet  08/18/18   [provider]  indomethacin (INDOCIN) 25 MG capsule  08/18/18   [provider]  lisinopril (ZESTRIL) 10 MG tablet  05/18/18   [provider]  losartan (COZAAR) 50 MG tablet Take 50 mg by mouth daily.    [provider]  Melatonin 5 MG CAPS Take 1 capsule by mouth at bedtime.    [provider]  neomycin-polymyxin-hydrocortisone (CORTISPORIN) OTIC solution Apply 2 drops to the ingrown toenail site twice daily. Cover with band-aid. 09/15/18   Park Liter, DPM    predniSONE (DELTASONE) 10 MG tablet  08/21/18   [provider]  sucralfate (CARAFATE) 1 GM/10ML suspension Take 10 mLs (1 g total) by mouth 4 (four) times daily -  with meals and at bedtime. 07/21/19   Nira Conn, MD                                                                                                                                    Past Surgical History Past Surgical History:  Procedure Laterality Date  . HEMORRHOID SURGERY    . HEMORRHOID SURGERY N/A 09/13/2014   Procedure: HEMORRHOIDECTOMY;  Surgeon: Renda Rolls, MD;  Location: [facility] ORS;  Service: General;  Laterality: N/A;   Family History No family history on file.  Social History Social History   Tobacco Use  . Smoking status: Never Smoker  . Smokeless tobacco: Never Used  Substance Use Topics  . Alcohol use: No  . Drug use: No  Allergies Patient has no known allergies.  Review of Systems Review of Systems All other systems are reviewed and are negative for acute change except as noted in the HPI  Physical Exam Vital Signs  I have reviewed the triage vital signs BP (!) 143/97 (BP Location: Right Arm)   Pulse 80   Temp 97.6 F (36.4 C) (Oral)   Resp (!) 22   Ht 5\' 9"  (1.753 m)   Wt 131.5 kg   SpO2 98%   BMI 42.83 kg/m   Physical Exam Vitals reviewed.  Constitutional:      General: He is not in acute distress.    Appearance: He is well-developed. He is obese. He is not diaphoretic.  HENT:     Head: Normocephalic and atraumatic.     Jaw: No trismus.     Right Ear: External ear normal.     Left Ear: External ear normal.     Nose: Nose normal.  Eyes:     General: No scleral icterus.    Conjunctiva/sclera: Conjunctivae normal.  Neck:     Trachea: Phonation normal.  Cardiovascular:     Rate and Rhythm: Normal rate and regular rhythm.  Pulmonary:     Effort: Pulmonary effort is normal. No respiratory distress.     Breath sounds: No stridor.  Abdominal:     General:  There is no distension.  Musculoskeletal:        General: Normal range of motion.     Cervical back: Normal range of motion.  Neurological:     Mental Status: He is alert and oriented to person, place, and time.  Psychiatric:        Behavior: Behavior normal.     ED Results and Treatments Labs (all labs ordered are listed, but only abnormal results are displayed) Labs Reviewed  CBC WITH DIFFERENTIAL/PLATELET - Abnormal; Notable for the following components:      Result Value   WBC 15.8 (*)    Neutro Abs 11.7 (*)    Abs Immature Granulocytes 0.11 (*)    All other components within normal limits  COMPREHENSIVE METABOLIC PANEL - Abnormal; Notable for the following components:   Glucose, Bld 109 (*)    Calcium 11.1 (*)    All other components within normal limits  LIPASE, BLOOD                                                                                                                         EKG  EKG Interpretation  Date/Time:  Friday July 20 2019 20:18:24 EDT Ventricular Rate:  78 PR Interval:  174 QRS Duration: 110 QT Interval:  374 QTC Calculation: 426 R Axis:   9 Text Interpretation: Normal sinus rhythm Incomplete right bundle branch block Cannot rule out Anterior infarct , age undetermined Abnormal ECG No old tracing to compare Confirmed by [id number] [phone]) on 07/20/2019 8:32:18 PM      Radiology No results found.  Pertinent labs & imaging results  that were available during my care of the patient were reviewed by me and considered in my medical decision making (see chart for details).  Medications Ordered in ED Medications  sodium chloride 0.9 % bolus 1,000 mL (0 mLs Intravenous Stopped 07/21/19 0101)  alum & mag hydroxide-simeth (MAALOX/MYLANTA) 200-200-20 MG/5ML suspension 30 mL (30 mLs Oral Given 07/20/19 2333)    And  lidocaine (XYLOCAINE) 2 % viscous mouth solution 15 mL (15 mLs Oral Given 07/20/19 2333)                                                                                                                                     Procedures Ultrasound ED Abd  Date/Time: 07/21/2019 4:43 AM Performed by: Nira Conn, MD Authorized by: Nira Conn, MD   Procedure details:    Indications: abdominal pain     Assessment for:  Gallstones   Hepatobiliary:  Visualized   Images: archived    Hepatobiliary findings:    Common bile duct:  Normal   Gallbladder wall:  Normal   Gallbladder stones: not identified     Intra-abdominal fluid: not identified     Sonographic Murphy's sign: negative      (including critical care time)  Medical Decision Making / ED Course I have reviewed the nursing notes for this encounter and the patient's prior records (if available in EHR or on provided paperwork).   FARZAD TIBBETTS was evaluated in Emergency Department on 07/21/2019 for the symptoms described in the history of present illness. He was evaluated in the context of the global COVID-19 pandemic, which necessitated consideration that the patient might be at risk for infection with the SARS-CoV-2 virus that causes COVID-19. Institutional protocols and algorithms that pertain to the evaluation of patients at risk for COVID-19 are in a state of rapid change based on information released by regulatory bodies including the CDC and federal and state organizations. These policies and algorithms were followed during the patient's care in the ED.  Several days of postprandial abdominal pain with one episode of nonbloody nonbilious emesis here.  Labs notable for leukocytosis.  Otherwise reassuring without electrolyte derangements or renal insufficiency.  Bedside ultrasound without evidence of acute cholecystitis or cholelithiasis.  No evidence of biliary obstruction or pancreatitis.  Treated symptomatically with IV fluids and GI cocktail.  Able to tolerate oral intake.  Abdomen remains benign.  Low suspicion for serious intra-abdominal  inflammatory/infectious process requiring additional imaging at this time.      Final Clinical Impression(s) / ED Diagnoses Final diagnoses:  Epigastric pain   The patient appears reasonably screened and/or stabilized for discharge and I doubt any other medical condition or other [facility] requiring further screening, evaluation, or treatment in the ED at this time prior to discharge. Safe for discharge with strict return precautions.  Disposition: Discharge  Condition: Good  I have discussed the results, Dx and Tx plan with the  patient/family who expressed understanding and agree(s) with the plan. Discharge instructions discussed at length. The patient/family was given strict return precautions who verbalized understanding of the instructions. No further questions at time of discharge.    ED Discharge Orders         Ordered    sucralfate (CARAFATE) 1 GM/10ML suspension  3 times daily with meals & bedtime     07/21/19 0058            Follow Up: Primary care provider  Schedule an appointment as soon as possible for a visit  If you do not have a primary care physician, contact [facility] at [phone] for referral      This chart was dictated using voice recognition software.  Despite best efforts to proofread,  errors can occur which can change the documentation meaning.   Fatima Blank, MD 07/21/19 [phone]

## 2019-07-21 LAB — NOVEL CORONAVIRUS, NAA: SARS-CoV-2, NAA: NOT DETECTED

## 2019-07-21 MED ORDER — SUCRALFATE 1 GM/10ML PO SUSP: 1.0000 g | Freq: Three times a day (TID) | ORAL | 0 refills | Status: DC

## 2019-08-06 DIAGNOSIS — R1013 Epigastric pain: Secondary | ICD-10-CM | POA: Diagnosis not present

## 2019-11-13 DIAGNOSIS — K219 Gastro-esophageal reflux disease without esophagitis: Secondary | ICD-10-CM | POA: Diagnosis not present

## 2019-11-27 DIAGNOSIS — Z23 Encounter for immunization: Secondary | ICD-10-CM | POA: Diagnosis not present

## 2019-12-26 DIAGNOSIS — Z23 Encounter for immunization: Secondary | ICD-10-CM | POA: Diagnosis not present

## 2020-02-11 DIAGNOSIS — Z0189 Encounter for other specified special examinations: Secondary | ICD-10-CM | POA: Diagnosis not present

## 2020-02-11 DIAGNOSIS — Z23 Encounter for immunization: Secondary | ICD-10-CM | POA: Diagnosis not present

## 2020-02-13 DIAGNOSIS — Z043 Encounter for examination and observation following other accident: Secondary | ICD-10-CM | POA: Diagnosis not present

## 2020-02-13 DIAGNOSIS — I10 Essential (primary) hypertension: Secondary | ICD-10-CM | POA: Diagnosis not present

## 2020-02-13 DIAGNOSIS — Z713 Dietary counseling and surveillance: Secondary | ICD-10-CM | POA: Diagnosis not present

## 2020-04-08 DIAGNOSIS — D23122 Other benign neoplasm of skin of left lower eyelid, including canthus: Secondary | ICD-10-CM | POA: Diagnosis not present

## 2020-04-08 DIAGNOSIS — D485 Neoplasm of uncertain behavior of skin: Secondary | ICD-10-CM | POA: Diagnosis not present

## 2020-04-08 DIAGNOSIS — D2339 Other benign neoplasm of skin of other parts of face: Secondary | ICD-10-CM | POA: Diagnosis not present

## 2020-04-23 DIAGNOSIS — Z23 Encounter for immunization: Secondary | ICD-10-CM | POA: Diagnosis not present

## 2020-05-08 DIAGNOSIS — I10 Essential (primary) hypertension: Secondary | ICD-10-CM | POA: Diagnosis not present

## 2020-05-08 DIAGNOSIS — M2550 Pain in unspecified joint: Secondary | ICD-10-CM | POA: Diagnosis not present

## 2020-05-13 DIAGNOSIS — M2550 Pain in unspecified joint: Secondary | ICD-10-CM | POA: Diagnosis not present

## 2020-06-11 DIAGNOSIS — D239 Other benign neoplasm of skin, unspecified: Secondary | ICD-10-CM | POA: Diagnosis not present

## 2020-06-11 DIAGNOSIS — D23112 Other benign neoplasm of skin of right lower eyelid, including canthus: Secondary | ICD-10-CM | POA: Diagnosis not present

## 2020-08-21 DIAGNOSIS — E669 Obesity, unspecified: Secondary | ICD-10-CM | POA: Diagnosis not present

## 2020-08-21 DIAGNOSIS — I10 Essential (primary) hypertension: Secondary | ICD-10-CM | POA: Diagnosis not present

## 2021-01-23 DIAGNOSIS — Z20822 Contact with and (suspected) exposure to covid-19: Secondary | ICD-10-CM | POA: Diagnosis not present

## 2021-02-13 DIAGNOSIS — E349 Endocrine disorder, unspecified: Secondary | ICD-10-CM | POA: Diagnosis not present

## 2021-02-13 DIAGNOSIS — N5202 Corporo-venous occlusive erectile dysfunction: Secondary | ICD-10-CM | POA: Diagnosis not present

## 2021-02-16 DIAGNOSIS — I10 Essential (primary) hypertension: Secondary | ICD-10-CM | POA: Diagnosis not present

## 2021-02-16 DIAGNOSIS — Z23 Encounter for immunization: Secondary | ICD-10-CM | POA: Diagnosis not present

## 2021-03-02 ENCOUNTER — Ambulatory Visit: Payer: Self-pay

## 2021-03-19 DIAGNOSIS — Z0189 Encounter for other specified special examinations: Secondary | ICD-10-CM | POA: Diagnosis not present

## 2021-03-19 LAB — BASIC METABOLIC PANEL
BUN: 16 (ref 4–21)
Chloride: 101 (ref 99–108)
Creatinine: 0.9 (ref <=1.3)
Glucose: 74
Potassium: 5.3 (ref 3.4–5.3)
Sodium: 140 (ref 137–147)

## 2021-03-19 LAB — COMPREHENSIVE METABOLIC PANEL
Albumin: 4.1 (ref 3.5–5.0)
Calcium: 9.1 (ref 8.7–10.7)
Globulin: 2.5

## 2021-03-19 LAB — LIPID PANEL
Cholesterol: 216 — AB (ref 0–200)
HDL: 45 (ref 35–70)
LDL Cholesterol: 137
LDl/HDL Ratio: 4.8
Triglycerides: 192 — AB (ref 40–160)

## 2021-03-19 LAB — CBC AND DIFFERENTIAL
HCT: 44 (ref 41–53)
Hemoglobin: 14.6 (ref 13.5–17.5)
Platelets: 328 (ref 150–399)
WBC: 7.1

## 2021-03-19 LAB — HEPATIC FUNCTION PANEL
ALT: 17 (ref 10–40)
AST: 18 (ref 14–40)
Alkaline Phosphatase: 96 (ref 25–125)
Bilirubin, Total: 0.4

## 2021-03-19 LAB — TSH: TSH: 0.81 (ref <=5.90)

## 2021-03-19 LAB — IRON,TIBC AND FERRITIN PANEL: Iron: 76

## 2021-03-19 LAB — CBC: RBC: 4.95 (ref 3.87–5.11)

## 2021-03-19 LAB — VITAMIN D 25 HYDROXY (VIT D DEFICIENCY, FRACTURES): Vit D, 25-Hydroxy: 36.4

## 2021-03-24 DIAGNOSIS — Z043 Encounter for examination and observation following other accident: Secondary | ICD-10-CM | POA: Diagnosis not present

## 2021-03-24 DIAGNOSIS — I10 Essential (primary) hypertension: Secondary | ICD-10-CM | POA: Diagnosis not present

## 2021-03-24 DIAGNOSIS — Z713 Dietary counseling and surveillance: Secondary | ICD-10-CM | POA: Diagnosis not present

## 2021-04-13 DIAGNOSIS — M109 Gout, unspecified: Secondary | ICD-10-CM | POA: Diagnosis not present

## 2021-04-17 DIAGNOSIS — F419 Anxiety disorder, unspecified: Secondary | ICD-10-CM | POA: Diagnosis not present

## 2021-05-30 DIAGNOSIS — R609 Edema, unspecified: Secondary | ICD-10-CM | POA: Diagnosis not present

## 2021-06-12 ENCOUNTER — Encounter: Payer: Self-pay | Admitting: Nurse Practitioner

## 2021-06-12 ENCOUNTER — Other Ambulatory Visit: Payer: Self-pay

## 2021-06-12 ENCOUNTER — Ambulatory Visit: Payer: BC Managed Care – PPO | Admitting: Nurse Practitioner

## 2021-06-12 VITALS — BP 120/84 | HR 78 | Temp 97.6°F | Ht 68.5 in | Wt 295.2 lb

## 2021-06-12 DIAGNOSIS — Z6841 Body Mass Index (BMI) 40.0 and over, adult: Secondary | ICD-10-CM

## 2021-06-12 DIAGNOSIS — I10 Essential (primary) hypertension: Secondary | ICD-10-CM

## 2021-06-12 DIAGNOSIS — F325 Major depressive disorder, single episode, in full remission: Secondary | ICD-10-CM | POA: Diagnosis not present

## 2021-06-12 DIAGNOSIS — K219 Gastro-esophageal reflux disease without esophagitis: Secondary | ICD-10-CM

## 2021-06-12 DIAGNOSIS — F332 Major depressive disorder, recurrent severe without psychotic features: Secondary | ICD-10-CM | POA: Insufficient documentation

## 2021-06-12 DIAGNOSIS — R600 Localized edema: Secondary | ICD-10-CM | POA: Diagnosis not present

## 2021-06-12 MED ORDER — ESOMEPRAZOLE MAGNESIUM 20 MG PO CPDR: 20.0000 mg | DELAYED_RELEASE_CAPSULE | Freq: Two times a day (BID) | ORAL | 0 refills | Status: DC

## 2021-06-12 MED ORDER — ESCITALOPRAM OXALATE 20 MG PO TABS: 20.0000 mg | ORAL_TABLET | Freq: Every day | ORAL | 1 refills | Status: DC

## 2021-06-12 MED ORDER — AMLODIPINE BESYLATE 5 MG PO TABS: 5.0000 mg | ORAL_TABLET | Freq: Every day | ORAL | 1 refills | Status: DC

## 2021-06-12 MED ORDER — HYDROCHLOROTHIAZIDE 12.5 MG PO TABS: 12.5000 mg | ORAL_TABLET | Freq: Every day | ORAL | 0 refills | Status: DC

## 2021-06-12 NOTE — Assessment & Plan Note (Addendum)
BP at goal with amlodipine and HCTZ reports labs were completed within the last 38months by nurse at worksite. No hx of OSA, no tobacco use, no ETOH abuse.  BP Readings from Last 3 Encounters:  06/12/21 120/84  07/21/19 (!) 159/110  09/15/18 (!) 146/88   Maintain med dose Requested lab results F/up in 81months

## 2021-06-12 NOTE — Progress Notes (Signed)
Subjective:  Patient ID: Zachary [name], male    DOB: Jun 03, 1970  Age: 51 y.o. MRN: [id number]  CC: Establish Care (New patient/Pt c/o swollen ankles. )  HPI  Primary hypertension BP at goal with amlodipine and HCTZ reports labs were completed within the last 4months by nurse at worksite. No hx of OSA, no tobacco use, no ETOH abuse.  BP Readings from Last 3 Encounters:  06/12/21 120/84  07/21/19 (!) 159/110  09/15/18 (!) 146/88   Maintain med dose Requested lab results F/up in 81months  Bilateral leg edema Chronic, onset 35yrs ago, waxing and waning. Improves with elevation and use of HCTZ. Swelling did not worsen with use of amlodipine (started this med 46months ago) Reports he maintains a low sodium diet. No exercise regimen. No leg pain, no redness.  Advised to maintain DASH diet, start regular exercise and use compressions stocking.  Obesity He is interested in use of Mounjaro injection. Previous weight loss attempts: Weight watcher diet x 1year, no weight loss noted Minimal exercise due to chronic bilateral knee pain. No FHx of Obesity or DM or premature CAD No personal hx of OSA or DM or CAD  We discussed the importance of incorporating regular exercise. We talked about modified exercise like water aerobic, recumbent bicycle and chair exercises. We talked about the pros and cons of saxenda vs Mounjaro vs phentermine. Decided to review review labs completed by Nurse at worksite prior to sending any rx at this time. Record requested F/up in 16months  Depression, major, single episode, complete remission (HCC) Stable mood with lexapro  GERD (gastroesophageal reflux disease) Stable with nexium Refill sent Reviewed past Medical, Social and Family history today.  Outpatient Medications Prior to Visit  Medication Sig Dispense Refill   caffeine 200 MG TABS tablet Take 200 mg by mouth every 4 (four) hours as needed.     DHEA 25 MG CAPS Take by mouth.     Melatonin  5 MG CAPS Take 1 capsule by mouth at bedtime.     tadalafil (CIALIS) 20 MG tablet Take 20 mg by mouth daily as needed for erectile dysfunction.     Zinc 30 MG CAPS Take by mouth.     escitalopram (LEXAPRO) 20 MG tablet Take 20 mg by mouth at bedtime.     esomeprazole (NEXIUM) 20 MG capsule Take 20 mg by mouth 2 (two) times daily.     hydrochlorothiazide (HYDRODIURIL) 12.5 MG tablet Take 12.5 mg by mouth daily.     amLODipine (NORVASC) 5 MG tablet Take 5 mg by mouth daily.     colchicine 0.6 MG tablet  (Patient not taking: Reported on 06/12/2021)     indomethacin (INDOCIN) 25 MG capsule  (Patient not taking: Reported on 06/12/2021)     lisinopril (ZESTRIL) 10 MG tablet  (Patient not taking: Reported on 06/12/2021)     losartan (COZAAR) 50 MG tablet Take 50 mg by mouth daily. (Patient not taking: Reported on 06/12/2021)     neomycin-polymyxin-hydrocortisone (CORTISPORIN) OTIC solution Apply 2 drops to the ingrown toenail site twice daily. Cover with band-aid. (Patient not taking: Reported on 06/12/2021) 10 mL 0   predniSONE (DELTASONE) 10 MG tablet  (Patient not taking: Reported on 06/12/2021)     sucralfate (CARAFATE) 1 GM/10ML suspension Take 10 mLs (1 g total) by mouth 4 (four) times daily -  with meals and at bedtime. (Patient not taking: Reported on 06/12/2021) 420 mL 0   No facility-administered medications prior to visit.  ROS See HPI  Objective:  BP 120/84 (BP Location: Left Arm, Patient Position: Sitting, Cuff Size: Large)    Pulse 78    Temp 97.6 F (36.4 C) (Temporal)    Ht 5' 8.5" (1.74 m)    Wt 295 lb 3.2 oz (133.9 kg)    SpO2 97%    BMI 44.23 kg/m   Physical Exam Vitals reviewed.  Cardiovascular:     Rate and Rhythm: Normal rate and regular rhythm.     Pulses: Normal pulses.     Heart sounds: Normal heart sounds.  Pulmonary:     Effort: Pulmonary effort is normal.     Breath sounds: Normal breath sounds.  Musculoskeletal:     Right lower leg: Edema present.     Left lower  leg: Edema present.  Skin:    General: Skin is warm and dry.     Findings: No erythema.  Neurological:     Mental Status: He is alert and oriented to person, place, and time.  Psychiatric:        Mood and Affect: Mood normal.        Behavior: Behavior normal.        Thought Content: Thought content normal.    Assessment & Plan:  This visit occurred during the SARS-CoV-2 public health emergency.  Safety protocols were in place, including screening questions prior to the visit, additional usage of staff PPE, and extensive cleaning of exam room while observing appropriate contact time as indicated for disinfecting solutions.   Zachary [name] was seen today for establish care.  Diagnoses and all orders for this visit:  Primary hypertension -     amLODipine (NORVASC) 5 MG tablet; Take 1 tablet (5 mg total) by mouth daily. -     hydrochlorothiazide (HYDRODIURIL) 12.5 MG tablet; Take 1 tablet (12.5 mg total) by mouth daily.  Bilateral leg edema -     hydrochlorothiazide (HYDRODIURIL) 12.5 MG tablet; Take 1 tablet (12.5 mg total) by mouth daily.  Class 3 severe obesity due to excess calories with serious comorbidity and body mass index (BMI) of 40.0 to 44.9 in adult [facility])  Depression, major, single episode, complete remission (HCC) -     escitalopram (LEXAPRO) 20 MG tablet; Take 1 tablet (20 mg total) by mouth at bedtime.  Gastroesophageal reflux disease without esophagitis -     esomeprazole (NEXIUM) 20 MG capsule; Take 1 capsule (20 mg total) by mouth 2 (two) times daily.    Problem List Items Addressed This Visit       Cardiovascular and Mediastinum   Primary hypertension - Primary    BP at goal with amlodipine and HCTZ reports labs were completed within the last 29months by nurse at worksite. No hx of OSA, no tobacco use, no ETOH abuse.  BP Readings from Last 3 Encounters:  06/12/21 120/84  07/21/19 (!) 159/110  09/15/18 (!) 146/88   Maintain med dose Requested lab  results F/up in 11months      Relevant Medications   tadalafil (CIALIS) 20 MG tablet   amLODipine (NORVASC) 5 MG tablet   hydrochlorothiazide (HYDRODIURIL) 12.5 MG tablet     Digestive   GERD (gastroesophageal reflux disease)    Stable with nexium Refill sent      Relevant Medications   esomeprazole (NEXIUM) 20 MG capsule     Other   Bilateral leg edema    Chronic, onset 60yrs ago, waxing and waning. Improves with elevation and use of HCTZ. Swelling did not worsen with  use of amlodipine (started this med 54months ago) Reports he maintains a low sodium diet. No exercise regimen. No leg pain, no redness.  Advised to maintain DASH diet, start regular exercise and use compressions stocking.      Relevant Medications   hydrochlorothiazide (HYDRODIURIL) 12.5 MG tablet   Depression, major, single episode, complete remission (HCC)    Stable mood with lexapro      Relevant Medications   escitalopram (LEXAPRO) 20 MG tablet   Obesity    He is interested in use of Mounjaro injection. Previous weight loss attempts: Weight watcher diet x 1year, no weight loss noted Minimal exercise due to chronic bilateral knee pain. No FHx of Obesity or DM or premature CAD No personal hx of OSA or DM or CAD  We discussed the importance of incorporating regular exercise. We talked about modified exercise like water aerobic, recumbent bicycle and chair exercises. We talked about the pros and cons of saxenda vs Mounjaro vs phentermine. Decided to review review labs completed by Nurse at worksite prior to sending any rx at this time. Record requested F/up in 71months      Relevant Medications   caffeine 200 MG TABS tablet    I have spent with this patient regarding history taking, documentation, review of medications, medical history, formulating plan and discussing treatment options with patient.  Follow-up: Return in about 3 months (around 09/09/2021) for CPE (fasting).  Alysia Penna,  NP

## 2021-06-12 NOTE — Assessment & Plan Note (Signed)
He is interested in use of Mounjaro injection. Previous weight loss attempts: Weight watcher diet x 1year, no weight loss noted Minimal exercise due to chronic bilateral knee pain. No FHx of Obesity or DM or premature CAD No personal hx of OSA or DM or CAD  We discussed the importance of incorporating regular exercise. We talked about modified exercise like water aerobic, recumbent bicycle and chair exercises. We talked about the pros and cons of saxenda vs Mounjaro vs phentermine. Decided to review review labs completed by Nurse at worksite prior to sending any rx at this time. Record requested F/up in 69months

## 2021-06-12 NOTE — Assessment & Plan Note (Signed)
Stable with nexium Refill sent

## 2021-06-12 NOTE — Patient Instructions (Addendum)
Thank you for choosing [facility]  Please fax copy of recent lab results. If these were not done, we will need to schedule lab appt CMP, lipid panel, TSH, HgbA1c.  Saxenda vs Mounjaro vs Phentermine.  Use compression stocking daily and off at night to help with edema Maintaining a low sodium diet and regular exercise to help with edema.  Consider water aerobics or recumbent bicycle or chair exercises.  How to Increase Your Level of Physical Activity Getting regular physical activity is important for your overall health and well-being. Most people do not get enough exercise. There are easy ways to increase your level of physical activity, even if you have not been very active in the past or if you are just starting out. What are the benefits of physical activity? Physical activity has many short-term and long-term benefits. Being active on a regular basis can improve your physical and mental health as well as provide other benefits. Physical health benefits Helping you lose weight or maintain a healthy weight. Strengthening your muscles and bones. Reducing your risk of certain long-term (chronic) diseases, including heart disease, cancer, and diabetes. Being able to move around more easily and for longer periods of time without getting tired (increased endurance or stamina). Improving your ability to fight off illness (enhanced immunity). Being able to sleep better. Helping you stay healthy as you get older, including: Helping you stay mobile, or capable of walking and moving around. Preventing accidents, such as falls. Increasing life expectancy. Mental health benefits Boosting your mood and improving your self-esteem. Lowering your chance of having mental health problems, such as depression or anxiety. Helping you feel good about your body. Other benefits Finding new sources of fun and enjoyment. Meeting new people who share a common interest. Before you begin If you  have a chronic illness or have not been active for a while, check with your health care provider about how to get started. Ask your health care provider what activities are safe for you. Start out slowly. Walking or doing some simple chair exercises is a good place to start, especially if you have not been active before or for a long time. Set goals that you can work toward. Ask your health care provider how much exercise is best for you. In general, most adults should: Do moderate-intensity exercise for at least 150 minutes each week (30 minutes on most days of the week) or vigorous exercise for at least 75 minutes each week, or a combination of these. Moderate-intensity exercise can include walking at a quick pace, biking, yoga, water aerobics, or gardening. Vigorous exercise involves activities that take more effort, such as jogging or running, playing sports, swimming laps, or jumping rope. Do strength exercises on at least 2 days each week. This can include weight lifting, body weight exercises, and resistance-band exercises. How to be more physically active Make a plan  Try to find activities that you enjoy. You are more likely to commit to an exercise routine if it does not feel like a chore. If you have bone or joint problems, choose low-impact exercises, like walking or swimming. Use these tips for being successful with an exercise plan: Find a workout partner for accountability. Join a group or class, such as an aerobics class, cycling class, or sports team. Make family time active. Go for a walk, bike, or swim. Include a variety of exercises each week. Consider using a fitness tracker, such as a mobile phone app or a device worn like  a watch, that will count the number of steps you take each day. Many people strive to reach 10,000 steps a day. Find ways to be active in your daily routines Besides your formal exercise plans, you can find ways to do physical activity during your daily  routines, such as: Walking or biking to work or to the store. Taking the stairs instead of the elevator. Parking farther away from the door at work or at the store. Planning walking meetings. Walking around while you are on the phone. Where to find more information Centers for Disease Control and Prevention: [employer].pt [employer]'s Council on Fitness, Sports & Nutrition: www.fitness.gov ChooseMyPlate: http://www.harvey.com/ Contact a health care provider if: You have headaches, muscle aches, or joint pain that is concerning. You feel dizzy or light-headed while exercising. You faint. You feel your heart skipping, racing, or fluttering. You have chest pain while exercising. Summary Exercise benefits your mind and body at any age, even if you are just starting out. If you have a chronic illness or have not been active for a while, check with your health care provider before increasing your physical activity. Choose activities that are safe and enjoyable for you. Ask your health care provider what activities are safe for you. Start slowly. Tell your health care provider if you have problems as you start to increase your activity level. This information is not intended to replace advice given to you by your health care provider. Make sure you discuss any questions you have with your health care provider. Document Revised: 08/15/2020 Document Reviewed: 08/15/2020 Elsevier Patient Education  2022 [employer].

## 2021-06-12 NOTE — Assessment & Plan Note (Signed)
Stable mood with lexapro 

## 2021-06-12 NOTE — Assessment & Plan Note (Signed)
>>  ASSESSMENT AND PLAN FOR DEPRESSION, MAJOR, SINGLE EPISODE, COMPLETE REMISSION (HCC) WRITTEN ON 06/12/2021  3:07 PM BY Erva Koke LUM, NP  Stable mood with lexapro

## 2021-06-12 NOTE — Assessment & Plan Note (Signed)
Chronic, onset 79yrs ago, waxing and waning. Improves with elevation and use of HCTZ. Swelling did not worsen with use of amlodipine (started this med 66months ago) Reports he maintains a low sodium diet. No exercise regimen. No leg pain, no redness.  Advised to maintain DASH diet, start regular exercise and use compressions stocking.

## 2021-06-29 DIAGNOSIS — M109 Gout, unspecified: Secondary | ICD-10-CM | POA: Diagnosis not present

## 2021-08-12 DIAGNOSIS — M2550 Pain in unspecified joint: Secondary | ICD-10-CM | POA: Diagnosis not present

## 2021-08-19 DIAGNOSIS — M25512 Pain in left shoulder: Secondary | ICD-10-CM | POA: Diagnosis not present

## 2021-08-21 ENCOUNTER — Ambulatory Visit: Payer: BC Managed Care – PPO | Admitting: Nurse Practitioner

## 2021-09-04 DIAGNOSIS — M542 Cervicalgia: Secondary | ICD-10-CM | POA: Diagnosis not present

## 2021-09-04 DIAGNOSIS — M25512 Pain in left shoulder: Secondary | ICD-10-CM | POA: Diagnosis not present

## 2021-09-07 DIAGNOSIS — M109 Gout, unspecified: Secondary | ICD-10-CM | POA: Diagnosis not present

## 2021-09-07 DIAGNOSIS — R252 Cramp and spasm: Secondary | ICD-10-CM | POA: Diagnosis not present

## 2021-09-09 DIAGNOSIS — M2550 Pain in unspecified joint: Secondary | ICD-10-CM | POA: Diagnosis not present

## 2021-09-11 ENCOUNTER — Encounter: Payer: BC Managed Care – PPO | Admitting: Nurse Practitioner

## 2021-10-08 DIAGNOSIS — M542 Cervicalgia: Secondary | ICD-10-CM | POA: Diagnosis not present

## 2021-10-08 DIAGNOSIS — M25512 Pain in left shoulder: Secondary | ICD-10-CM | POA: Diagnosis not present

## 2021-11-24 DIAGNOSIS — Z0189 Encounter for other specified special examinations: Secondary | ICD-10-CM | POA: Diagnosis not present

## 2021-11-25 DIAGNOSIS — M109 Gout, unspecified: Secondary | ICD-10-CM | POA: Diagnosis not present

## 2021-12-08 DIAGNOSIS — E785 Hyperlipidemia, unspecified: Secondary | ICD-10-CM | POA: Diagnosis not present

## 2021-12-08 DIAGNOSIS — E669 Obesity, unspecified: Secondary | ICD-10-CM | POA: Diagnosis not present

## 2021-12-08 DIAGNOSIS — I10 Essential (primary) hypertension: Secondary | ICD-10-CM | POA: Diagnosis not present

## 2022-01-28 DIAGNOSIS — M109 Gout, unspecified: Secondary | ICD-10-CM | POA: Diagnosis not present

## 2022-03-18 DIAGNOSIS — F339 Major depressive disorder, recurrent, unspecified: Secondary | ICD-10-CM | POA: Diagnosis not present

## 2022-03-18 DIAGNOSIS — F419 Anxiety disorder, unspecified: Secondary | ICD-10-CM | POA: Diagnosis not present

## 2022-03-27 ENCOUNTER — Other Ambulatory Visit: Payer: Self-pay | Admitting: Nurse Practitioner

## 2022-03-27 DIAGNOSIS — F325 Major depressive disorder, single episode, in full remission: Secondary | ICD-10-CM

## 2022-03-29 NOTE — Telephone Encounter (Signed)
Chart supports Rx Last OV: 06/2021 Next OV: not scheduled, 30 day supply sent, pt needs f/u for further refills

## 2022-05-07 ENCOUNTER — Other Ambulatory Visit: Payer: Self-pay | Admitting: Nurse Practitioner

## 2022-05-07 DIAGNOSIS — F325 Major depressive disorder, single episode, in full remission: Secondary | ICD-10-CM

## 2022-09-06 DIAGNOSIS — L300 Nummular dermatitis: Secondary | ICD-10-CM | POA: Diagnosis not present

## 2022-10-07 ENCOUNTER — Ambulatory Visit: Payer: BC Managed Care – PPO | Admitting: Nurse Practitioner

## 2022-10-07 ENCOUNTER — Encounter: Payer: Self-pay | Admitting: Nurse Practitioner

## 2022-10-07 VITALS — BP 146/92 | HR 64 | Temp 97.3°F | Ht 68.5 in | Wt 261.8 lb

## 2022-10-07 DIAGNOSIS — F325 Major depressive disorder, single episode, in full remission: Secondary | ICD-10-CM

## 2022-10-07 DIAGNOSIS — I10 Essential (primary) hypertension: Secondary | ICD-10-CM | POA: Diagnosis not present

## 2022-10-07 DIAGNOSIS — M1A9XX0 Chronic gout, unspecified, without tophus (tophi): Secondary | ICD-10-CM

## 2022-10-07 DIAGNOSIS — M109 Gout, unspecified: Secondary | ICD-10-CM | POA: Insufficient documentation

## 2022-10-07 DIAGNOSIS — Z136 Encounter for screening for cardiovascular disorders: Secondary | ICD-10-CM

## 2022-10-07 DIAGNOSIS — Z13220 Encounter for screening for lipoid disorders: Secondary | ICD-10-CM | POA: Diagnosis not present

## 2022-10-07 DIAGNOSIS — Z6839 Body mass index (BMI) 39.0-39.9, adult: Secondary | ICD-10-CM

## 2022-10-07 MED ORDER — ZEPBOUND 7.5 MG/0.5ML ~~LOC~~ SOAJ
7.5000 mg | SUBCUTANEOUS | 0 refills | Status: DC
Start: 2022-10-07 — End: 2022-11-10

## 2022-10-07 MED ORDER — AMLODIPINE BESYLATE 5 MG PO TABS
5.0000 mg | ORAL_TABLET | Freq: Every day | ORAL | 3 refills | Status: DC
Start: 2022-10-07 — End: 2023-01-14

## 2022-10-07 NOTE — Assessment & Plan Note (Signed)
Stable mood with lexapro 20mg 

## 2022-10-07 NOTE — Patient Instructions (Addendum)
Go to lab Bring medications to next appt Sign medical release to get records from wellness center at [facility] BP at home 3x/week in AM. Bring BP reading to next office visit. Schedule appointment with nutritionist Start daily exercise  How to Increase Your Level of Physical Activity Getting regular physical activity is important for your overall health and well-being. Most people do not get enough exercise. There are easy ways to increase your level of physical activity, even if you have not been very active in the past or if you are just starting out. What are the benefits of physical activity? Physical activity has many short-term and long-term benefits. Being active on a regular basis can improve your physical and mental health as well as provide other benefits. Physical health benefits Helping you lose weight or maintain a healthy weight. Strengthening your muscles and bones. Reducing your risk of certain long-term (chronic) diseases, including heart disease, cancer, and diabetes. Being able to move around more easily and for longer periods of time without getting tired (increased endurance or stamina). Improving your ability to fight off illness (enhanced immunity). Being able to sleep better. Helping you stay healthy as you get older, including: Helping you stay mobile, or capable of walking and moving around. Preventing accidents, such as falls. Increasing life expectancy. Mental health benefits Boosting your mood and improving your self-esteem. Lowering your chance of having mental health problems, such as depression or anxiety. Helping you feel good about your body. Other benefits Finding new sources of fun and enjoyment. Meeting new people who share a common interest. Before you begin If you have a chronic illness or have not been active for a while, check with your health care provider about how to get started. Ask your health care provider what activities are  safe for you. Start out slowly. Walking or doing some simple chair exercises is a good place to start, especially if you have not been active before or for a long time. Set goals that you can work toward. Ask your health care provider how much exercise is best for you. In general, most adults should: Do moderate-intensity exercise for at least 150 minutes each week (30 minutes on most days of the week) or vigorous exercise for at least 75 minutes each week, or a combination of these. Moderate-intensity exercise can include walking at a quick pace, biking, yoga, water aerobics, or gardening. Vigorous exercise involves activities that take more effort, such as jogging or running, playing sports, swimming laps, or jumping rope. Do strength exercises on at least 2 days each week. This can include weight lifting, body weight exercises, and resistance-band exercises. How to be more physically active Make a plan  Try to find activities that you enjoy. You are more likely to commit to an exercise routine if it does not feel like a chore. If you have bone or joint problems, choose low-impact exercises, like walking or swimming. Use these tips for being successful with an exercise plan: Find a workout partner for accountability. Join a group or class, such as an aerobics class, cycling class, or sports team. Make family time active. Go for a walk, bike, or swim. Include a variety of exercises each week. Consider using a fitness tracker, such as a mobile phone app or a device worn like a watch, that will count the number of steps you take each day. Many people strive to reach 10,000 steps a day. Find ways to be active in your daily routines  Besides your formal exercise plans, you can find ways to do physical activity during your daily routines, such as: Walking or biking to work or to the store. Taking the stairs instead of the elevator. Parking farther away from the door at work or at the  store. Planning walking meetings. Walking around while you are on the phone. Where to find more information Centers for Disease Control and Prevention: [employer].pt President's Council on Fitness, Sports & Nutrition: www.fitness.gov ChooseMyPlate: http://www.harvey.com/ Contact a health care provider if: You have headaches, muscle aches, or joint pain that is concerning. You feel dizzy or light-headed while exercising. You faint. You feel your heart skipping, racing, or fluttering. You have chest pain while exercising. Summary Exercise benefits your mind and body at any age, even if you are just starting out. If you have a chronic illness or have not been active for a while, check with your health care provider before increasing your physical activity. Choose activities that are safe and enjoyable for you. Ask your health care provider what activities are safe for you. Start slowly. Tell your health care provider if you have problems as you start to increase your activity level. This information is not intended to replace advice given to you by your health care provider. Make sure you discuss any questions you have with your health care provider. Document Revised: 08/15/2020 Document Reviewed: 08/15/2020 Elsevier Patient Education  2024 [employer].

## 2022-10-07 NOTE — Progress Notes (Signed)
Established Patient Visit  Patient: Zachary [name]   DOB: 1970-06-05   52 y.o. Male  MRN: [id number] Visit Date: 10/07/2022  Subjective:    Chief Complaint  Patient presents with   Medication Consultation   HPI Primary hypertension Medication and diet non compliance Previous BP readings at [facility] clinic: 130-136/80-84 BP Readings from Last 3 Encounters:  10/07/22 (!) 146/92  06/12/21 120/84  07/21/19 (!) 159/110    Advised to resume amlodipine 5mg , importance of diet, exercise and medication compliance, monitor BP at home 2-3x/week in AM F/up in 34month Check CMp  Obesity Has been under the care of Jonita Albee, NP with [facility] since 06/2022. Current use of zepbound 5mg  weekly with no adverse effects. No exercise regimen, no diet modification Starting weight 291lbs, lost 30lbs in last 4months with zepbound Wt Readings from Last 3 Encounters:  10/07/22 261 lb 12.8 oz (118.8 kg)  06/12/21 295 lb 3.2 oz (133.9 kg)  07/20/19 290 lb (131.5 kg)    Increased zepbound dose to 7.5mg  Advised about the importance of diet modification and daily exercise. He agreed to schedule appointment with [facility]. Check CMP, THYROID, and Lipid panel F/up in 34month  Gout Reports recurrent gout exacerbation, so he was started on allopurinol and celebrex by corporate NP 8months ago. Last uric acid level at 5. 01/2022  Maintain allupurinol dose Repeat uric acid and cmp  Depression, major, single episode, complete remission (HCC) Stable mood with lexapro 20mg     Reviewed medical, surgical, and social history today  Medications: Outpatient Medications Prior to Visit  Medication Sig   allopurinol (ZYLOPRIM) 100 MG tablet Take 100 mg by mouth daily.   caffeine 200 MG TABS tablet Take 200 mg by mouth every 4 (four) hours as needed.   celecoxib (CELEBREX) 200 MG capsule Take 200 mg by mouth daily.    escitalopram (LEXAPRO) 20 MG tablet Take 20 mg by mouth daily.   esomeprazole (NEXIUM) 20 MG capsule Take 1 capsule (20 mg total) by mouth 2 (two) times daily.   Melatonin 5 MG CAPS Take 1 capsule by mouth at bedtime.   [DISCONTINUED] amLODipine (NORVASC) 5 MG tablet Take 1 tablet (5 mg total) by mouth daily.   [DISCONTINUED] hydrochlorothiazide (HYDRODIURIL) 12.5 MG tablet Take 1 tablet (12.5 mg total) by mouth daily.   [DISCONTINUED] tadalafil (CIALIS) 20 MG tablet Take 20 mg by mouth daily as needed for erectile dysfunction.   [DISCONTINUED] ZEPBOUND 5 MG/0.5ML Pen Inject 5 mg into the skin once a week.   [DISCONTINUED] DHEA 25 MG CAPS Take by mouth.   [DISCONTINUED] escitalopram (LEXAPRO) 20 MG tablet TAKE 1 TABLET(20 MG) BY MOUTH AT BEDTIME   [DISCONTINUED] Zinc 30 MG CAPS Take by mouth.   No facility-administered medications prior to visit.   Reviewed past medical and social history.   ROS per HPI above      Objective:  BP (!) 146/92   Pulse 64   Temp (!) 97.3 F (36.3 C) (Temporal)   Ht 5' 8.5" (1.74 m)   Wt 261 lb 12.8 oz (118.8 kg)   SpO2 98%   BMI 39.23 kg/m      Physical Exam Cardiovascular:     Rate and Rhythm: Normal rate and regular rhythm.     Pulses: Normal pulses.     Heart sounds: Normal heart sounds.  Pulmonary:  Effort: Pulmonary effort is normal.     Breath sounds: Normal breath sounds.  Neurological:     Mental Status: He is alert and oriented to person, place, and time.     No results found for any visits on 10/07/22.    Assessment & Plan:    Problem List Items Addressed This Visit       Cardiovascular and Mediastinum   Primary hypertension    Medication and diet non compliance Previous BP readings at [facility] clinic: 130-136/80-84 BP Readings from Last 3 Encounters:  10/07/22 (!) 146/92  06/12/21 120/84  07/21/19 (!) 159/110    Advised to resume amlodipine 5mg , importance of diet, exercise and medication compliance,  monitor BP at home 2-3x/week in AM F/up in 38month Check CMp      Relevant Medications   tirzepatide (ZEPBOUND) 7.5 MG/0.5ML Pen   amLODipine (NORVASC) 5 MG tablet   Other Relevant Orders   Comprehensive metabolic panel     Other   Depression, major, single episode, complete remission (HCC)    Stable mood with lexapro 20mg       Relevant Medications   escitalopram (LEXAPRO) 20 MG tablet   Gout    Reports recurrent gout exacerbation, so he was started on allopurinol and celebrex by corporate NP 8months ago. Last uric acid level at 5. 01/2022  Maintain allupurinol dose Repeat uric acid and cmp      Relevant Medications   allopurinol (ZYLOPRIM) 100 MG tablet   celecoxib (CELEBREX) 200 MG capsule   Other Relevant Orders   Uric acid   Obesity - Primary    Has been under the care of Jonita Albee, NP with [facility] since 06/2022. Current use of zepbound 5mg  weekly with no adverse effects. No exercise regimen, no diet modification Starting weight 291lbs, lost 30lbs in last 4months with zepbound Wt Readings from Last 3 Encounters:  10/07/22 261 lb 12.8 oz (118.8 kg)  06/12/21 295 lb 3.2 oz (133.9 kg)  07/20/19 290 lb (131.5 kg)    Increased zepbound dose to 7.5mg  Advised about the importance of diet modification and daily exercise. He agreed to schedule appointment with [facility]ist at [facility]. Check CMP, THYROID, and Lipid panel F/up in 38month      Relevant Medications   tirzepatide (ZEPBOUND) 7.5 MG/0.5ML Pen   Other Relevant Orders   Lipid panel   TSH   Comprehensive metabolic panel   Other Visit Diagnoses     Encounter for lipid screening for cardiovascular disease          Return in about 4 weeks (around 11/04/2022) for HTN, Weight management.     Alysia Penna, NP

## 2022-10-07 NOTE — Assessment & Plan Note (Signed)
>>  ASSESSMENT AND PLAN FOR DEPRESSION, MAJOR, SINGLE EPISODE, COMPLETE REMISSION (HCC) WRITTEN ON 10/07/2022 12:19 PM BY Chondra Boyde LUM, NP  Stable mood with lexapro 20mg

## 2022-10-07 NOTE — Assessment & Plan Note (Signed)
Medication and diet non compliance Previous BP readings at [facility] clinic: 130-136/80-84 BP Readings from Last 3 Encounters:  10/07/22 (!) 146/92  06/12/21 120/84  07/21/19 (!) 159/110    Advised to resume amlodipine 5mg , importance of diet, exercise and medication compliance, monitor BP at home 2-3x/week in AM F/up in 47month Check CMp

## 2022-10-07 NOTE — Assessment & Plan Note (Signed)
Has been under the care of Jonita Albee, NP with [facility] since 06/2022. Current use of zepbound 5mg  weekly with no adverse effects. No exercise regimen, no diet modification Starting weight 291lbs, lost 30lbs in last 4months with zepbound Wt Readings from Last 3 Encounters:  10/07/22 261 lb 12.8 oz (118.8 kg)  06/12/21 295 lb 3.2 oz (133.9 kg)  07/20/19 290 lb (131.5 kg)    Increased zepbound dose to 7.5mg  Advised about the importance of diet modification and daily exercise. He agreed to schedule appointment with [facility]ist at [facility]. Check CMP, THYROID, and Lipid panel F/up in 71month

## 2022-10-07 NOTE — Assessment & Plan Note (Addendum)
Reports recurrent gout exacerbation, so he was started on allopurinol and celebrex by corporate NP 8months ago. Last uric acid level at 5. 01/2022  Maintain allupurinol dose Repeat uric acid and cmp

## 2022-10-13 ENCOUNTER — Encounter: Payer: Self-pay | Admitting: Nurse Practitioner

## 2022-10-14 LAB — LAB REPORT - SCANNED
A1c: 5
EGFR: 91

## 2022-11-10 ENCOUNTER — Other Ambulatory Visit: Payer: Self-pay

## 2022-11-10 MED ORDER — ZEPBOUND 10 MG/0.5ML ~~LOC~~ SOAJ: 10.0000 mg | SUBCUTANEOUS | 0 refills | Status: DC

## 2022-12-06 ENCOUNTER — Other Ambulatory Visit: Payer: Self-pay

## 2022-12-06 MED ORDER — ZEPBOUND 10 MG/0.5ML ~~LOC~~ SOAJ: 10.0000 mg | SUBCUTANEOUS | 0 refills | Status: DC

## 2023-01-03 DIAGNOSIS — I110 Hypertensive heart disease with heart failure: Secondary | ICD-10-CM | POA: Diagnosis not present

## 2023-01-03 DIAGNOSIS — I4510 Unspecified right bundle-branch block: Secondary | ICD-10-CM | POA: Diagnosis not present

## 2023-01-03 DIAGNOSIS — E876 Hypokalemia: Secondary | ICD-10-CM | POA: Diagnosis not present

## 2023-01-03 DIAGNOSIS — F17290 Nicotine dependence, other tobacco product, uncomplicated: Secondary | ICD-10-CM | POA: Diagnosis not present

## 2023-01-03 DIAGNOSIS — R569 Unspecified convulsions: Secondary | ICD-10-CM | POA: Diagnosis not present

## 2023-01-03 DIAGNOSIS — I2102 ST elevation (STEMI) myocardial infarction involving left anterior descending coronary artery: Secondary | ICD-10-CM | POA: Insufficient documentation

## 2023-01-03 DIAGNOSIS — R0989 Other specified symptoms and signs involving the circulatory and respiratory systems: Secondary | ICD-10-CM | POA: Diagnosis not present

## 2023-01-03 DIAGNOSIS — I2510 Atherosclerotic heart disease of native coronary artery without angina pectoris: Secondary | ICD-10-CM | POA: Diagnosis not present

## 2023-01-03 DIAGNOSIS — I2109 ST elevation (STEMI) myocardial infarction involving other coronary artery of anterior wall: Secondary | ICD-10-CM | POA: Diagnosis not present

## 2023-01-03 DIAGNOSIS — R079 Chest pain, unspecified: Secondary | ICD-10-CM | POA: Diagnosis not present

## 2023-01-03 DIAGNOSIS — I472 Ventricular tachycardia, unspecified: Secondary | ICD-10-CM | POA: Diagnosis not present

## 2023-01-03 DIAGNOSIS — I959 Hypotension, unspecified: Secondary | ICD-10-CM | POA: Diagnosis not present

## 2023-01-03 DIAGNOSIS — I213 ST elevation (STEMI) myocardial infarction of unspecified site: Secondary | ICD-10-CM | POA: Diagnosis not present

## 2023-01-03 DIAGNOSIS — I499 Cardiac arrhythmia, unspecified: Secondary | ICD-10-CM | POA: Diagnosis not present

## 2023-01-03 DIAGNOSIS — I517 Cardiomegaly: Secondary | ICD-10-CM | POA: Diagnosis not present

## 2023-01-03 DIAGNOSIS — I214 Non-ST elevation (NSTEMI) myocardial infarction: Secondary | ICD-10-CM | POA: Diagnosis not present

## 2023-01-03 DIAGNOSIS — R0789 Other chest pain: Secondary | ICD-10-CM | POA: Diagnosis not present

## 2023-01-03 DIAGNOSIS — R570 Cardiogenic shock: Secondary | ICD-10-CM | POA: Diagnosis not present

## 2023-01-03 DIAGNOSIS — I5021 Acute systolic (congestive) heart failure: Secondary | ICD-10-CM | POA: Diagnosis not present

## 2023-01-03 DIAGNOSIS — I4901 Ventricular fibrillation: Secondary | ICD-10-CM | POA: Diagnosis not present

## 2023-01-03 DIAGNOSIS — R001 Bradycardia, unspecified: Secondary | ICD-10-CM | POA: Diagnosis not present

## 2023-01-03 DIAGNOSIS — I77810 Thoracic aortic ectasia: Secondary | ICD-10-CM | POA: Diagnosis not present

## 2023-01-05 DIAGNOSIS — I5021 Acute systolic (congestive) heart failure: Secondary | ICD-10-CM | POA: Insufficient documentation

## 2023-01-05 DIAGNOSIS — I4901 Ventricular fibrillation: Secondary | ICD-10-CM | POA: Insufficient documentation

## 2023-01-07 ENCOUNTER — Telehealth: Payer: Self-pay

## 2023-01-07 NOTE — Transitions of Care (Post Inpatient/ED Visit) (Signed)
   01/07/2023  Name: Zachary [name] MRN: [id number] DOB: 1970-08-24  Today's TOC FU Call Status: Today's TOC FU Call Status:: Unsuccessful Call (1st Attempt) Unsuccessful Call (1st Attempt) Date: 01/07/23  Attempted to reach the patient regarding the most recent Inpatient/ED visit.  Follow Up Plan: Additional outreach attempts will be made to reach the patient to complete the Transitions of Care (Post Inpatient/ED visit) call.   Jodelle Gross RN, BSN, CCM [facility] RN Care Coordinator/ Transitions of Care Direct Dial: [phone]  Fax: [phone]

## 2023-01-10 ENCOUNTER — Telehealth: Payer: Self-pay

## 2023-01-10 NOTE — Transitions of Care (Post Inpatient/ED Visit) (Signed)
   01/10/2023  Name: Zachary [name] MRN: [id number] DOB: 1970/05/14  Today's TOC FU Call Status: Today's TOC FU Call Status:: Unsuccessful Call (2nd Attempt) Unsuccessful Call (2nd Attempt) Date: 01/10/23  Attempted to reach the patient regarding the most recent Inpatient/ED visit.  Follow Up Plan: Additional outreach attempts will be made to reach the patient to complete the Transitions of Care (Post Inpatient/ED visit) call.   Jodelle Gross RN, BSN, CCM [facility] RN Care Coordinator/ Transitions of Care Direct Dial: [phone]  Fax: [phone]

## 2023-01-11 ENCOUNTER — Telehealth: Payer: Self-pay

## 2023-01-11 ENCOUNTER — Other Ambulatory Visit: Payer: Self-pay | Admitting: Nurse Practitioner

## 2023-01-11 NOTE — Transitions of Care (Post Inpatient/ED Visit) (Signed)
   01/11/2023  Name: KARAM GOGUEN MRN: [id number] DOB: Jan 28, 1971  Today's TOC FU Call Status: Today's TOC FU Call Status:: Unsuccessful Call (3rd Attempt) Unsuccessful Call (3rd Attempt) Date: 01/11/23  Attempted to reach the patient regarding the most recent Inpatient/ED visit.  Follow Up Plan: No further outreach attempts will be made at this time. We have been unable to contact the patient.  Jodelle Gross RN, BSN, CCM [facility] RN Care Coordinator/ Transitions of Care

## 2023-01-13 NOTE — Telephone Encounter (Signed)
Called patient and informed him of provider comment(s). Patient is scheduled to see Alysia Penna, NP on 01/14/23 at 1:20 PM. Patient thanked me for calling.

## 2023-01-14 ENCOUNTER — Ambulatory Visit: Payer: BC Managed Care – PPO | Admitting: Nurse Practitioner

## 2023-01-14 ENCOUNTER — Encounter: Payer: Self-pay | Admitting: Nurse Practitioner

## 2023-01-14 VITALS — BP 120/70 | HR 60 | Temp 97.2°F | Wt 240.0 lb

## 2023-01-14 DIAGNOSIS — I252 Old myocardial infarction: Secondary | ICD-10-CM | POA: Diagnosis not present

## 2023-01-14 DIAGNOSIS — I10 Essential (primary) hypertension: Secondary | ICD-10-CM | POA: Diagnosis not present

## 2023-01-14 DIAGNOSIS — Z6835 Body mass index (BMI) 35.0-35.9, adult: Secondary | ICD-10-CM

## 2023-01-14 DIAGNOSIS — I2510 Atherosclerotic heart disease of native coronary artery without angina pectoris: Secondary | ICD-10-CM | POA: Insufficient documentation

## 2023-01-14 DIAGNOSIS — F17200 Nicotine dependence, unspecified, uncomplicated: Secondary | ICD-10-CM | POA: Insufficient documentation

## 2023-01-14 DIAGNOSIS — D72829 Elevated white blood cell count, unspecified: Secondary | ICD-10-CM | POA: Diagnosis not present

## 2023-01-14 LAB — RENAL FUNCTION PANEL
Albumin: 3.6 g/dL (ref 3.5–5.2)
BUN: 16 mg/dL (ref 6–23)
CO2: 27 meq/L (ref 19–32)
Calcium: 9.7 mg/dL (ref 8.4–10.5)
Chloride: 107 meq/L (ref 96–112)
Creatinine, Ser: 0.96 mg/dL (ref 0.40–1.50)
GFR: 91.27 mL/min (ref >60.00)
Glucose, Bld: 77 mg/dL (ref 70–99)
Phosphorus: 3.6 mg/dL (ref 2.3–4.6)
Potassium: 4.6 meq/L (ref 3.5–5.1)
Sodium: 140 meq/L (ref 135–145)

## 2023-01-14 LAB — CBC WITH DIFFERENTIAL/PLATELET
Basophils Absolute: 0.1 10*3/uL (ref 0.0–0.1)
Basophils Relative: 0.7 % (ref 0.0–3.0)
Eosinophils Absolute: 0.2 10*3/uL (ref 0.0–0.7)
Eosinophils Relative: 2.5 % (ref 0.0–5.0)
HCT: 42.5 % (ref 39.0–52.0)
Hemoglobin: 14.2 g/dL (ref 13.0–17.0)
Lymphocytes Relative: 16.2 % (ref 12.0–46.0)
Lymphs Abs: 1.3 10*3/uL (ref 0.7–4.0)
MCHC: 33.4 g/dL (ref 30.0–36.0)
MCV: 89.4 fl (ref 78.0–100.0)
Monocytes Absolute: 0.5 10*3/uL (ref 0.1–1.0)
Monocytes Relative: 6.9 % (ref 3.0–12.0)
Neutro Abs: 5.9 10*3/uL (ref 1.4–7.7)
Neutrophils Relative %: 73.7 % (ref 43.0–77.0)
Platelets: 357 10*3/uL (ref 150.0–400.0)
RBC: 4.76 Mil/uL (ref 4.22–5.81)
RDW: 14.3 % (ref 11.5–15.5)
WBC: 8 10*3/uL (ref 4.0–10.5)

## 2023-01-14 MED ORDER — TIRZEPATIDE-WEIGHT MANAGEMENT 12.5 MG/0.5ML ~~LOC~~ SOAJ
12.5000 mg | SUBCUTANEOUS | 0 refills | Status: DC
Start: 2023-01-14 — End: 2023-02-16

## 2023-01-14 NOTE — Assessment & Plan Note (Signed)
3attempts to complete TCM call: unsuccessfull (01/07/23, 01/10/23, and 01/11/23) Hospitalization 09/02-09/04/24 Presented with CP at rest with V-fib x2 and hypotension: transported to ED via EMS, required defibrillation and cardioversion, diagnosed with acute STEMI, had heart catherization completed, DES placed in LAD via femoral access. Radial access attempted. mild concentric left ventricular hypertrophy. LV systolic function is mildly reduced. LV EF at 40%. LV filling pattern is prolonged relaxation. There are regional wall motion abnormalities as specified below. There is hypokinesis of the mid to distal anterior-anteroseptal wall and apex LAD territory . The right ventricle is mild to moderately dilated. The right ventricular systolic function is normal. The right atrium is mildly dilated. There is no significant valvular stenosis or regurgitation. The aortic sinus is normal size. Mildly dilated ascending aorta. IVC size was mildly dilated. There is no pericardial effusion.  HE was discharged with with brillinta, ASA, atorvastatin, hydrochlorothiazide, losartan, and coreg. Today he denies any chest pain, no groin pain/wrist pain or swelling. No GU/GI/nose bleeding. He has f/up appointment with [facility] cardiology: Dr.Muna Timsina (cardiology) 01/24/23.  I reconciled med list with patient, and reviewed lab results. Repeat CBC and BMP today Advised to quit tobacco use and maintain appointment with cardiology.

## 2023-01-14 NOTE — Assessment & Plan Note (Signed)
BP at goal with coreg, losartan and hydrochlorothiazide BP Readings from Last 3 Encounters:  01/14/23 120/70  10/13/22 130/86  10/07/22 (!) 146/92    Maintain med doses Under the care of cardiology

## 2023-01-14 NOTE — Progress Notes (Signed)
Established Patient Visit  Patient: Zachary [name]   DOB: [birth year]   51 y.o. Male  MRN: [id number] Visit Date: 01/14/2023  Subjective:    Chief Complaint  Patient presents with   Follow-up    Follow up on Zepound. He also states he had a heart attack on Labor day but is feeling much better.    HPI  History of MI (myocardial infarction) 3attempts to complete TCM call: unsuccessfull (01/07/23, 01/10/23, and 01/11/23) Hospitalization 09/02-09/04/24 Presented with CP at rest with V-fib x2 and hypotension: transported to ED via EMS, required defibrillation and cardioversion, diagnosed with acute STEMI, had heart catherization completed, DES placed in LAD via femoral access. Radial access attempted. mild concentric left ventricular hypertrophy. LV systolic function is mildly reduced. LV EF at 40%. LV filling pattern is prolonged relaxation. There are regional wall motion abnormalities as specified below. There is hypokinesis of the mid to distal anterior-anteroseptal wall and apex LAD territory . The right ventricle is mild to moderately dilated. The right ventricular systolic function is normal. The right atrium is mildly dilated. There is no significant valvular stenosis or regurgitation. The aortic sinus is normal size. Mildly dilated ascending aorta. IVC size was mildly dilated. There is no pericardial effusion.  HE was discharged with with brillinta, ASA, atorvastatin, hydrochlorothiazide, losartan, and coreg. Today he denies any chest pain, no groin pain/wrist pain or swelling. No GU/GI/nose bleeding. He has f/up appointment with [facility] cardiology: Dr.Muna Timsina (cardiology) 01/24/23.  I reconciled med list with patient, and reviewed lab results. Repeat CBC and BMP today Advised to quit tobacco use and maintain appointment with cardiology.  Primary hypertension BP at goal with coreg, losartan and hydrochlorothiazide BP Readings from Last 3 Encounters:   01/14/23 120/70  10/13/22 130/86  10/07/22 (!) 146/92    Maintain med doses Under the care of cardiology  Obesity Has been under the care of Jonita Albee, NP with [facility] since 06/2022. Current use of zepbound 10mg  weekly with no adverse effects. No exercise regimen Starts he has implement diet modification with help of nutritionist at [facility] center: low fat and high protein Starting weight 291lbs Lost 55lbs in last 3months Wt Readings from Last 3 Encounters:  01/14/23 240 lb (108.9 kg)  10/07/22 261 lb 12.8 oz (118.8 kg)  06/12/21 295 lb 3.2 oz (133.9 kg)    Increased zepbound dose to 12.5mg  Stop caffeine supplement Start exercise when instructed by cardiology. Check BMP today  F/up in 30month  Tobacco use disorder Smokes 5 cigars per week. Has no interest in quitting despite recent MI. Advised about correlation between nicotine and CVD, PAD, MI/CVA, cancer and COPD. Strongly advised to quit.  Wt Readings from Last 3 Encounters:  01/14/23 240 lb (108.9 kg)  10/07/22 261 lb 12.8 oz (118.8 kg)  06/12/21 295 lb 3.2 oz (133.9 kg)    Reviewed medical, surgical, and social history today  Medications: Outpatient Medications Prior to Visit  Medication Sig   allopurinol (ZYLOPRIM) 100 MG tablet Take 100 mg by mouth daily.   aspirin 81 MG chewable tablet Chew by mouth.   atorvastatin (LIPITOR) 80 MG tablet Take 1 tablet by mouth at bedtime.   carvedilol (COREG) 6.25 MG tablet Take by mouth.   escitalopram (LEXAPRO) 20 MG tablet Take 20 mg by mouth daily.   esomeprazole (NEXIUM) 20 MG capsule Take 1 capsule (20 mg total) by mouth  2 (two) times daily.   hydrochlorothiazide (HYDRODIURIL) 12.5 MG tablet Take 12.5 mg by mouth daily.   losartan (COZAAR) 25 MG tablet Take 0.5 tablets by mouth daily.   Melatonin 5 MG CAPS Take 1 capsule by mouth at bedtime.   ticagrelor (BRILINTA) 90 MG TABS tablet Take by mouth.   [DISCONTINUED] caffeine 200 MG TABS  tablet Take 200 mg by mouth every 4 (four) hours as needed.   [DISCONTINUED] celecoxib (CELEBREX) 200 MG capsule Take 200 mg by mouth daily.   [DISCONTINUED] tirzepatide (ZEPBOUND) 10 MG/0.5ML Pen Inject 10 mg into the skin once a week.   [DISCONTINUED] amLODipine (NORVASC) 5 MG tablet Take 1 tablet (5 mg total) by mouth daily. (Patient not taking: Reported on 01/14/2023)   No facility-administered medications prior to visit.   Reviewed past medical and social history.   ROS per HPI above      Objective:  BP 120/70   Pulse 60   Temp (!) 97.2 F (36.2 C) (Temporal)   Wt 240 lb (108.9 kg)   SpO2 98%   BMI 35.96 kg/m      Physical Exam  No results found for any visits on 01/14/23.    Assessment & Plan:    Problem List Items Addressed This Visit     CAD (coronary atherosclerotic disease)   Relevant Medications   aspirin 81 MG chewable tablet   atorvastatin (LIPITOR) 80 MG tablet   carvedilol (COREG) 6.25 MG tablet   losartan (COZAAR) 25 MG tablet   hydrochlorothiazide (HYDRODIURIL) 12.5 MG tablet   tirzepatide (ZEPBOUND) 12.5 MG/0.5ML Pen   History of MI (myocardial infarction)    3attempts to complete TCM call: unsuccessfull (01/07/23, 01/10/23, and 01/11/23) Hospitalization 09/02-09/04/24 Presented with CP at rest with V-fib x2 and hypotension: transported to ED via EMS, required defibrillation and cardioversion, diagnosed with acute STEMI, had heart catherization completed, DES placed in LAD via femoral access. Radial access attempted. mild concentric left ventricular hypertrophy. LV systolic function is mildly reduced. LV EF at 40%. LV filling pattern is prolonged relaxation. There are regional wall motion abnormalities as specified below. There is hypokinesis of the mid to distal anterior-anteroseptal wall and apex LAD territory . The right ventricle is mild to moderately dilated. The right ventricular systolic function is normal. The right atrium is mildly dilated. There  is no significant valvular stenosis or regurgitation. The aortic sinus is normal size. Mildly dilated ascending aorta. IVC size was mildly dilated. There is no pericardial effusion.  HE was discharged with with brillinta, ASA, atorvastatin, hydrochlorothiazide, losartan, and coreg. Today he denies any chest pain, no groin pain/wrist pain or swelling. No GU/GI/nose bleeding. He has f/up appointment with [facility] cardiology: Dr.Muna Timsina (cardiology) 01/24/23.  I reconciled med list with patient, and reviewed lab results. Repeat CBC and BMP today Advised to quit tobacco use and maintain appointment with cardiology.      Obesity - Primary    Has been under the care of Jonita Albee, NP with [facility] since 06/2022. Current use of zepbound 10mg  weekly with no adverse effects. No exercise regimen Starts he has implement diet modification with help of nutritionist at [facility] center: low fat and high protein Starting weight 291lbs Lost 55lbs in last 3months Wt Readings from Last 3 Encounters:  01/14/23 240 lb (108.9 kg)  10/07/22 261 lb 12.8 oz (118.8 kg)  06/12/21 295 lb 3.2 oz (133.9 kg)    Increased zepbound dose to 12.5mg  Stop caffeine supplement  Start exercise when instructed by cardiology. Check BMP today  F/up in 46month      Relevant Medications   tirzepatide (ZEPBOUND) 12.5 MG/0.5ML Pen   Primary hypertension    BP at goal with coreg, losartan and hydrochlorothiazide BP Readings from Last 3 Encounters:  01/14/23 120/70  10/13/22 130/86  10/07/22 (!) 146/92    Maintain med doses Under the care of cardiology      Relevant Medications   aspirin 81 MG chewable tablet   atorvastatin (LIPITOR) 80 MG tablet   carvedilol (COREG) 6.25 MG tablet   losartan (COZAAR) 25 MG tablet   hydrochlorothiazide (HYDRODIURIL) 12.5 MG tablet   tirzepatide (ZEPBOUND) 12.5 MG/0.5ML Pen   Other Relevant Orders   Renal Function Panel   Tobacco use disorder     Smokes 5 cigars per week. Has no interest in quitting despite recent MI. Advised about correlation between nicotine and CVD, PAD, MI/CVA, cancer and COPD. Strongly advised to quit.      Other Visit Diagnoses     Leukocytosis, unspecified type       Relevant Orders   CBC with Differential/Platelet      Return in about 4 weeks (around 02/11/2023) for Weight management.     Alysia Penna, NP

## 2023-01-14 NOTE — Assessment & Plan Note (Signed)
Smokes 5 cigars per week. Has no interest in quitting despite recent MI. Advised about correlation between nicotine and CVD, PAD, MI/CVA, cancer and COPD. Strongly advised to quit.

## 2023-01-14 NOTE — Patient Instructions (Signed)
Go to lab Stop use of tobacco

## 2023-01-14 NOTE — Assessment & Plan Note (Signed)
Has been under the care of Zachary Albee, NP [name] since 06/2022. Current use of zepbound 10mg  weekly with no adverse effects. No exercise regimen Starts he has implement diet modification with help of nutritionist at [facility] center: low fat and high protein Starting weight 291lbs Lost 55lbs in last 3months Wt Readings from Last 3 Encounters:  01/14/23 240 lb (108.9 kg)  10/07/22 261 lb 12.8 oz (118.8 kg)  06/12/21 295 lb 3.2 oz (133.9 kg)    Increased zepbound dose to 12.5mg  Stop caffeine supplement Start exercise when instructed by cardiology. Check BMP today  F/up in 97month

## 2023-01-17 NOTE — Progress Notes (Signed)
Stable Follow instructions as discussed during office visit.

## 2023-01-24 DIAGNOSIS — I4901 Ventricular fibrillation: Secondary | ICD-10-CM | POA: Diagnosis not present

## 2023-01-24 DIAGNOSIS — I2102 ST elevation (STEMI) myocardial infarction involving left anterior descending coronary artery: Secondary | ICD-10-CM | POA: Diagnosis not present

## 2023-01-24 DIAGNOSIS — I5021 Acute systolic (congestive) heart failure: Secondary | ICD-10-CM | POA: Diagnosis not present

## 2023-01-25 DIAGNOSIS — I4510 Unspecified right bundle-branch block: Secondary | ICD-10-CM | POA: Diagnosis not present

## 2023-01-25 DIAGNOSIS — R001 Bradycardia, unspecified: Secondary | ICD-10-CM | POA: Diagnosis not present

## 2023-02-01 ENCOUNTER — Encounter: Payer: Self-pay | Admitting: Nurse Practitioner

## 2023-02-08 DIAGNOSIS — I2102 ST elevation (STEMI) myocardial infarction involving left anterior descending coronary artery: Secondary | ICD-10-CM | POA: Diagnosis not present

## 2023-02-16 ENCOUNTER — Other Ambulatory Visit: Payer: Self-pay | Admitting: Nurse Practitioner

## 2023-02-16 DIAGNOSIS — I2510 Atherosclerotic heart disease of native coronary artery without angina pectoris: Secondary | ICD-10-CM

## 2023-02-16 DIAGNOSIS — E66812 Obesity, class 2: Secondary | ICD-10-CM

## 2023-02-16 DIAGNOSIS — I2102 ST elevation (STEMI) myocardial infarction involving left anterior descending coronary artery: Secondary | ICD-10-CM | POA: Diagnosis not present

## 2023-02-16 DIAGNOSIS — I10 Essential (primary) hypertension: Secondary | ICD-10-CM

## 2023-02-16 MED ORDER — TIRZEPATIDE-WEIGHT MANAGEMENT 12.5 MG/0.5ML ~~LOC~~ SOAJ: 12.5000 mg | SUBCUTANEOUS | 0 refills | Status: DC

## 2023-02-17 DIAGNOSIS — I2102 ST elevation (STEMI) myocardial infarction involving left anterior descending coronary artery: Secondary | ICD-10-CM | POA: Diagnosis not present

## 2023-02-21 DIAGNOSIS — I2102 ST elevation (STEMI) myocardial infarction involving left anterior descending coronary artery: Secondary | ICD-10-CM | POA: Diagnosis not present

## 2023-02-23 DIAGNOSIS — I2102 ST elevation (STEMI) myocardial infarction involving left anterior descending coronary artery: Secondary | ICD-10-CM | POA: Diagnosis not present

## 2023-02-24 DIAGNOSIS — I2102 ST elevation (STEMI) myocardial infarction involving left anterior descending coronary artery: Secondary | ICD-10-CM | POA: Diagnosis not present

## 2023-03-02 ENCOUNTER — Other Ambulatory Visit (HOSPITAL_COMMUNITY): Payer: Self-pay

## 2023-03-03 ENCOUNTER — Other Ambulatory Visit: Payer: Self-pay | Admitting: Nurse Practitioner

## 2023-03-03 ENCOUNTER — Encounter: Payer: Self-pay | Admitting: Nurse Practitioner

## 2023-03-03 DIAGNOSIS — I2510 Atherosclerotic heart disease of native coronary artery without angina pectoris: Secondary | ICD-10-CM

## 2023-03-03 DIAGNOSIS — E66812 Obesity, class 2: Secondary | ICD-10-CM

## 2023-03-03 DIAGNOSIS — I10 Essential (primary) hypertension: Secondary | ICD-10-CM

## 2023-03-04 ENCOUNTER — Telehealth: Payer: Self-pay

## 2023-03-04 NOTE — Telephone Encounter (Signed)
Called patient and left a brief VM to schedule follow up appointment for RX refill request; message was also sent through patient portal.

## 2023-03-04 NOTE — Telephone Encounter (Signed)
Left brief VM for call back to schedule follow up appointment for RX request.

## 2023-03-07 DIAGNOSIS — I2102 ST elevation (STEMI) myocardial infarction involving left anterior descending coronary artery: Secondary | ICD-10-CM | POA: Diagnosis not present

## 2023-03-09 DIAGNOSIS — I2102 ST elevation (STEMI) myocardial infarction involving left anterior descending coronary artery: Secondary | ICD-10-CM | POA: Diagnosis not present

## 2023-03-09 LAB — LAB REPORT - SCANNED
A1c: 5
EGFR: 96

## 2023-03-10 DIAGNOSIS — I2102 ST elevation (STEMI) myocardial infarction involving left anterior descending coronary artery: Secondary | ICD-10-CM | POA: Diagnosis not present

## 2023-03-11 ENCOUNTER — Other Ambulatory Visit (HOSPITAL_COMMUNITY): Payer: Self-pay

## 2023-03-16 DIAGNOSIS — I2102 ST elevation (STEMI) myocardial infarction involving left anterior descending coronary artery: Secondary | ICD-10-CM | POA: Diagnosis not present

## 2023-03-17 DIAGNOSIS — I2102 ST elevation (STEMI) myocardial infarction involving left anterior descending coronary artery: Secondary | ICD-10-CM | POA: Diagnosis not present

## 2023-03-23 ENCOUNTER — Other Ambulatory Visit: Payer: Self-pay | Admitting: Nurse Practitioner

## 2023-03-23 ENCOUNTER — Telehealth (INDEPENDENT_AMBULATORY_CARE_PROVIDER_SITE_OTHER): Payer: BC Managed Care – PPO | Admitting: Nurse Practitioner

## 2023-03-23 ENCOUNTER — Other Ambulatory Visit (HOSPITAL_COMMUNITY): Payer: Self-pay

## 2023-03-23 ENCOUNTER — Telehealth: Payer: Self-pay | Admitting: Nurse Practitioner

## 2023-03-23 ENCOUNTER — Encounter: Payer: Self-pay | Admitting: Nurse Practitioner

## 2023-03-23 VITALS — Ht 69.0 in | Wt 222.0 lb

## 2023-03-23 DIAGNOSIS — E66811 Obesity, class 1: Secondary | ICD-10-CM

## 2023-03-23 DIAGNOSIS — M1A9XX0 Chronic gout, unspecified, without tophus (tophi): Secondary | ICD-10-CM

## 2023-03-23 DIAGNOSIS — Z6832 Body mass index (BMI) 32.0-32.9, adult: Secondary | ICD-10-CM | POA: Diagnosis not present

## 2023-03-23 DIAGNOSIS — I2510 Atherosclerotic heart disease of native coronary artery without angina pectoris: Secondary | ICD-10-CM

## 2023-03-23 DIAGNOSIS — E6609 Other obesity due to excess calories: Secondary | ICD-10-CM | POA: Diagnosis not present

## 2023-03-23 MED ORDER — ALLOPURINOL 100 MG PO TABS
100.0000 mg | ORAL_TABLET | Freq: Every day | ORAL | 1 refills | Status: DC
  Filled 2023-03-23: qty 90, 90d supply, fill #0

## 2023-03-23 MED ORDER — ZEPBOUND 15 MG/0.5ML ~~LOC~~ SOAJ
15.0000 mg | SUBCUTANEOUS | 1 refills | Status: DC
Start: 2023-03-23 — End: 2023-05-26

## 2023-03-23 NOTE — Assessment & Plan Note (Addendum)
Lost 22lbs in 2months with zepbound 12.5mg  Total weight loss in 6months: 66lbs Denies any adverse effects. Reports decreased effects of 12.5mg  dose. Exercise: cardiac rehab 3x/week Diet: daily, continues to struggle to adequate protein intake. Use of protein shake for meal replacement. Wt Readings from Last 3 Encounters:  03/23/23 222 lb (100.7 kg)  01/14/23 240 lb (108.9 kg)  10/07/22 261 lb 12.8 oz (118.8 kg)   Increased zepbound dose to 15mg  Requested lab results from wellness clinic F/up in 2months in office

## 2023-03-23 NOTE — Telephone Encounter (Signed)
LR hx provider LOV  03/22/23 FOV  none scheduled.   Please review and advise.  Thanks. Dm/cma

## 2023-03-23 NOTE — Telephone Encounter (Signed)
Pt called and said he was never seen on his mychart video and he logged on 15 minutes earlier and he was at work and no one ever showed up . Pt also said he do not want [facility] as his PCP that he want to see a different provider

## 2023-03-23 NOTE — Progress Notes (Signed)
Error in chart patient did not have EKG performed and was not seen in clinic today. ERROR.

## 2023-03-23 NOTE — Progress Notes (Signed)
Virtual Visit via Video Note  I connected withNAME@ on 03/23/23 at  8:40 AM EST by a video enabled telemedicine application and verified that I am speaking with the correct person using two identifiers.  Location: Patient:Home Provider: Office Participants: patient and provider  I discussed the limitations of evaluation and management by telemedicine and the availability of in person appointments. I also discussed with the patient that there may be a patient responsible charge related to this service. The patient expressed understanding and agreed to proceed.  RU:[id number] management. PT is requesting medication increase for Zepbound 15mg    History of Present Illness:  Obesity Lost 22lbs in 2months with zepbound 12.5mg  Total weight loss in 6months: 66lbs Denies any adverse effects. Reports decreased effects of 12.5mg  dose. Exercise: cardiac rehab 3x/week Diet: daily, continues to struggle to adequate protein intake. Use of protein shake for meal replacement. Wt Readings from Last 3 Encounters:  03/23/23 222 lb (100.7 kg)  01/14/23 240 lb (108.9 kg)  10/07/22 261 lb 12.8 oz (118.8 kg)   Increased zepbound dose to 15mg  Requested lab results from wellness clinic F/up in 2months in office  Wt Readings from Last 3 Encounters:  03/23/23 222 lb (100.7 kg)  01/14/23 240 lb (108.9 kg)  10/07/22 261 lb 12.8 oz (118.8 kg)    Observations/Objective: Alert and oriented Assessment and Plan: Zachary [name] was seen today for video visit .  Diagnoses and all orders for this visit:  Atherosclerosis of native coronary artery of native heart without angina pectoris -     EKG 12-Lead   Follow Up Instructions: See instructions above   I discussed the assessment and treatment plan with the patient. The patient was provided an opportunity to ask questions and all were answered. The patient agreed with the plan and demonstrated an understanding of the instructions.   The patient was  advised to call back or seek an in-person evaluation if the symptoms worsen or if the condition fails to improve as anticipated.  Alysia Penna, NP

## 2023-03-25 NOTE — Telephone Encounter (Signed)
LVM for pt to return my call. Verifying if he is still interested in transferring his care to another provider.

## 2023-04-04 DIAGNOSIS — I2102 ST elevation (STEMI) myocardial infarction involving left anterior descending coronary artery: Secondary | ICD-10-CM | POA: Diagnosis not present

## 2023-04-06 ENCOUNTER — Other Ambulatory Visit (HOSPITAL_COMMUNITY): Payer: Self-pay

## 2023-04-08 ENCOUNTER — Telehealth: Payer: Self-pay

## 2023-04-08 ENCOUNTER — Other Ambulatory Visit (HOSPITAL_COMMUNITY): Payer: Self-pay

## 2023-04-08 NOTE — Telephone Encounter (Signed)
Pharmacy Patient Advocate Encounter   Received notification from Physician's Office that prior authorization for Zepbound 15mg /0.67ml is required/requested.   Insurance verification completed.   The patient is insured through [employer] .   Per test claim: PA required; PA submitted to above mentioned insurance via Prompt PA Key/confirmation #/[id number] Status is pending

## 2023-04-11 ENCOUNTER — Other Ambulatory Visit (HOSPITAL_COMMUNITY): Payer: Self-pay

## 2023-04-11 NOTE — Telephone Encounter (Signed)
Pharmacy Patient Advocate Encounter  Received notification from [employer] [facility] that Prior Authorization for Zepbound 15mg /ml has been APPROVED from 04/08/2023 to 04/06/2024. Ran test claim, Copay is $4.00. This test claim was processed through [facility]- copay amounts may vary at other pharmacies due to pharmacy/plan contracts, or as the patient moves through the different stages of their insurance plan.   PA #/Case ID/Reference #: [id number]

## 2023-04-13 ENCOUNTER — Ambulatory Visit (HOSPITAL_COMMUNITY)
Admission: EM | Admit: 2023-04-13 | Discharge: 2023-04-13 | Disposition: A | Discharge status: Home / Self Care | Payer: BC Managed Care – PPO

## 2023-04-13 NOTE — ED Provider Notes (Signed)
Behavioral Health Urgent Care Medical Screening Exam  Patient Name: Zachary [name] MRN: [id number] Date of Evaluation: 04/13/23 Chief Complaint:   Diagnosis: MDD, recurrent severe without psychotic features  CC: "Worsening depression."  History of Present illness: Zachary [name] is a 52 y.o. Caucasian male with prior psychiatric history significant for major depressive disorder, and PMH of STEMI who presents voluntarily to [facility] accompanied by his parents for worsening depression x 2 weeks, with plans to shoot himself with a gun.  Patient reports a long history of depression since 57 when he was discharged from the [facility].  He reports a history of suicide attempts 30 years ago while in the [employer] he overdosed with Tylenol and was hospitalized.  He reports being hospitalized at [facility] for depression in 1994 or 1995.  He reports being prescribed Lexapro 20 years ago and he has remained compliant with with this Lexapro.  H he had present hospitalization for MI he experienced in September 2024 with stent placement.  Reports increasing depression from this time with worsening symptoms during this holidays.  Report depressive symptoms to include hopelessness, worthlessness, decreased appetite, insomnia, decreased motivation, sad mood, and anhedonia.  Patient reports stressors to include his divorce 10 years ago, housing insecurity, however, currently has his own personal bankruptcy.  He denies being registered with a therapist or a psychiatrist currently.  Last seen by a therapist was 1 year ago and before then when he had 1 visit.  Last seen by a psychiatrist was 10 years ago.  However, he has been receiving Lexapro for his depression management in the past 20 years.  Patient denies drug use, marijuana use, and reports drinking alcohol very rarely.  Patient currently lives by himself and is working at [employer]. Reports he loves his job.  He reports his parents are still alive.  Has family  history of MI with his maternal grandfather.  He reports family psychiatric history mom diagnosed with depression and alcoholism.  However unsure of suicidal attempts, substance abuse or drug use by his family members.  Patient able to contract for safety with this provider and reports that his gun is removed from the home and secured by his friend.  Able to provide this friend name and phone number as Janan Ridge at [phone].  Janan Ridge patient's friend, called and confirm that this set with appointment was removed from the home and secured by him.  Patient is requesting follow-up outpatient resources for his depression. [facility] called an appointment secured for patient to follow up virtually our first visit on 04/21/2023 at 11:00 AM, and to see a psychiatrist for medication management and PHP program enrollment.  Objective: Patient is alert, calm and oriented the patient, time, place, and situation.  Speech is clear and coherent with normal volume and pattern.  Able to participate effectively in answering assessment questions adequately.  He presents with sad and depressed mood.  Thought content and thought process coherent and relevant.  Obviously not responding to internal or external stimuli.  He currently denies SI, HI, or AVH.  Vital signs without critical values   Flowsheet Row ED from 04/13/2023 in [facility]  C-SSRS RISK CATEGORY High Risk      Psychiatric Specialty Exam  Presentation  General Appearance:Appropriate for Environment; Casual; Fairly Groomed  Eye Contact:Good  Speech:Clear and Coherent  Speech Volume:Normal  Handedness:Right  Mood and Affect  Mood: Anxious; Depressed  Affect: Congruent  Thought Process  Thought Processes:  Coherent  Descriptions of Associations:Intact  Orientation:Full (Time, Place and Person)  Thought Content:Logical; WDL    Hallucinations:None  Ideas of  Reference:None  Suicidal Thoughts:No  Homicidal Thoughts:No  Sensorium  Memory: Immediate Fair  Judgment: Fair  Insight: Fair  Art therapist  Concentration: Fair  Attention Span: Fair  Recall: [employer] of Knowledge: Fair  Language: Fair  Psychomotor Activity  Psychomotor Activity: Normal  Assets  Assets: Physical Health; Resilience; Social Support  Sleep  Sleep: Poor  Number of hours: 3.0 hours  Physical Exam: Physical Exam Vitals and nursing note reviewed.  HENT:     Head: Normocephalic.     Nose: Nose normal.     Mouth/Throat:     Mouth: Mucous membranes are moist.  Eyes:     Extraocular Movements: Extraocular movements intact.  Cardiovascular:     Rate and Rhythm: Bradycardia present.  Pulmonary:     Effort: Pulmonary effort is normal.  Abdominal:     Comments: Deferred  Genitourinary:    Comments: Deferred Musculoskeletal:        General: Normal range of motion.     Cervical back: Normal range of motion.  Skin:    General: Skin is warm.  Neurological:     General: No focal deficit present.     Mental Status: He is alert and oriented to person, place, and time.  Psychiatric:        Mood and Affect: Mood normal.        Behavior: Behavior normal.        Thought Content: Thought content normal.    Review of Systems  Constitutional:  Negative for chills and fever.  HENT:  Negative for sore throat.   Eyes:  Negative for blurred vision.  Respiratory:  Negative for cough, sputum production, shortness of breath and wheezing.   Cardiovascular:  Negative for chest pain and palpitations.  Gastrointestinal:  Negative for abdominal pain, heartburn, nausea and vomiting.  Genitourinary:  Negative for dysuria, frequency and urgency.  Musculoskeletal:  Negative for falls.  Skin:  Negative for itching and rash.  Neurological:  Negative for dizziness, tingling, tremors and headaches.  Endo/Heme/Allergies:        See allergy listing   Psychiatric/Behavioral:  Positive for depression. The patient is nervous/anxious and has insomnia.    Blood pressure 115/77, pulse (!) 53, temperature 98.3 F (36.8 C), temperature source Oral, resp. rate 17, SpO2 99%. There is no height or weight on file to calculate BMI.  Musculoskeletal: Strength & Muscle Tone: within normal limits Gait & Station: normal Patient leans: N/A  [facility] MSE Discharge Disposition for Follow up and Recommendations: Based on my evaluation the patient does not appear to have an emergency medical condition and can be discharged with resources and follow up care in outpatient services for Medication Management, Partial Hospitalization Program, Individual Therapy, and Group Therapy   Cecilie Lowers, FNP 04/13/2023, 2:57 PM

## 2023-04-13 NOTE — Discharge Instructions (Addendum)
Based on the information that you have provided and the presenting issues outpatient services and resources for have been recommended.  It is imperative that you follow through with treatment recommendations within 5-7 days from the of discharge to mitigate further risk to your safety and mental well-being. A list of referrals has been provided below to get you started.  You are not limited to the list provided and this list does not reflect all of the providers that accept [employer] and [employer].  As always, you first consult your insurance provider with Valinda Hoar and [employer] due to changes in insurance plans to ensure that the providers that you choose accepts your specific [employer] plan. .  In case of an urgent crisis, you may contact the Mobile Crisis Unit with [facility] at [phone].   Patient has been referred to the [facility] Program  You are scheduled for an assessment for the Partial Hospitalization Program on Thursday, 04/21/23 @ 11a. PHP is a virtual group therapy that meets Mon-Fri from 9am-1pm for approximately 2-3 weeks. This assessment appointment will last approximately 1-1.5 hours and will be virtual via [employer]. Please download the [employer] Teams app prior to the appointment if you will use a tablet or phone for the appointment. If you need to cancel or reschedule, please call [phone].
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
## 2023-04-13 NOTE — ED Notes (Signed)
Patient d/c in stable condition with parents to home. Patient denies Si, HI, AVS, and self-harm thoughts.

## 2023-04-15 ENCOUNTER — Telehealth (HOSPITAL_COMMUNITY): Payer: Self-pay | Admitting: Licensed Clinical Social Worker

## 2023-04-21 ENCOUNTER — Other Ambulatory Visit (HOSPITAL_COMMUNITY): Payer: BC Managed Care – PPO | Attending: Psychiatry | Admitting: Professional

## 2023-04-21 DIAGNOSIS — F329 Major depressive disorder, single episode, unspecified: Secondary | ICD-10-CM | POA: Insufficient documentation

## 2023-04-21 DIAGNOSIS — F4310 Post-traumatic stress disorder, unspecified: Secondary | ICD-10-CM | POA: Insufficient documentation

## 2023-04-21 DIAGNOSIS — F332 Major depressive disorder, recurrent severe without psychotic features: Secondary | ICD-10-CM | POA: Insufficient documentation

## 2023-04-21 NOTE — Progress Notes (Signed)
Order(s) created erroneously. Erroneous order ID: [id number]  Order canceled by: CHART CORRECTION ANALYST, FIFTEEN  Order cancel date/time: 04/21/2023 3:00 PM

## 2023-04-22 ENCOUNTER — Other Ambulatory Visit (HOSPITAL_COMMUNITY): Payer: BC Managed Care – PPO | Attending: Psychiatry | Admitting: Licensed Clinical Social Worker

## 2023-04-22 ENCOUNTER — Other Ambulatory Visit (HOSPITAL_COMMUNITY): Payer: BC Managed Care – PPO | Attending: Psychiatry

## 2023-04-22 DIAGNOSIS — Z5986 Financial insecurity: Secondary | ICD-10-CM | POA: Diagnosis not present

## 2023-04-22 DIAGNOSIS — I252 Old myocardial infarction: Secondary | ICD-10-CM | POA: Insufficient documentation

## 2023-04-22 DIAGNOSIS — Z9151 Personal history of suicidal behavior: Secondary | ICD-10-CM | POA: Insufficient documentation

## 2023-04-22 DIAGNOSIS — Z658 Other specified problems related to psychosocial circumstances: Secondary | ICD-10-CM | POA: Diagnosis not present

## 2023-04-22 DIAGNOSIS — G4700 Insomnia, unspecified: Secondary | ICD-10-CM | POA: Insufficient documentation

## 2023-04-22 DIAGNOSIS — F17290 Nicotine dependence, other tobacco product, uncomplicated: Secondary | ICD-10-CM | POA: Insufficient documentation

## 2023-04-22 DIAGNOSIS — F332 Major depressive disorder, recurrent severe without psychotic features: Secondary | ICD-10-CM | POA: Insufficient documentation

## 2023-04-22 DIAGNOSIS — Z602 Problems related to living alone: Secondary | ICD-10-CM | POA: Insufficient documentation

## 2023-04-22 DIAGNOSIS — F4310 Post-traumatic stress disorder, unspecified: Secondary | ICD-10-CM | POA: Diagnosis not present

## 2023-04-22 DIAGNOSIS — Z79899 Other long term (current) drug therapy: Secondary | ICD-10-CM | POA: Insufficient documentation

## 2023-04-22 DIAGNOSIS — F411 Generalized anxiety disorder: Secondary | ICD-10-CM | POA: Diagnosis not present

## 2023-04-22 DIAGNOSIS — R4589 Other symptoms and signs involving emotional state: Secondary | ICD-10-CM | POA: Insufficient documentation
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
## 2023-04-22 NOTE — Psych (Signed)
Active     OP Depression     LTG: Reduce frequency, intensity, and duration of depression symptoms so that daily functioning is improved (Initial)     Start:  04/22/23    Expected End:  07/21/23         LTG: Increase coping skills to manage depression and improve ability to perform daily activities (Initial)     Start:  04/22/23    Expected End:  07/21/23         STG: Zachary [name] will attend at least 80% of scheduled PHP sessions    (Initial)     Start:  04/22/23    Expected End:  07/21/23         STG: Zachary [name] will attend at least 80% of scheduled group psychotherapy sessions  (Initial)     Start:  04/22/23    Expected End:  07/21/23         STG: Zachary [name] will participate in at least 80% of scheduled individual psychotherapy sessions  (Initial)     Start:  04/22/23    Expected End:  07/21/23         STG: Zachary [name] will attend at least 80% of scheduled family sessions  (Initial)     Start:  04/22/23    Expected End:  07/21/23         STG: Zachary [name] will complete at least 80% of assigned homework  (Initial)     Start:  04/22/23    Expected End:  07/21/23         STG: Zachary [name] will identify cognitive patterns and beliefs that support depression (Initial)     Start:  04/22/23    Expected End:  07/21/23         Encourage Zachary [name] to participate in recovery peer support activities weekly      Start:  04/22/23         Work with Zachary [name] to identify the major components of a recent episode of depression: physical symptoms, major thoughts and images, and major behaviors they experienced     Start:  04/22/23         Therapist will educate patient on cognitive distortions and the rationale for treatment of depression     Start:  04/22/23         [name] will identify AT LEAST 3 cognitive distortions they are currently using and write reframing statements to replace them     Start:  04/22/23         Therapist will review PLEASE Skills (Treat Physical Illness, Balance Eating,  Avoid Mood-Altering Substances, Balance Sleep and Get Exercise) with patient     Start:  04/22/23           [name] verbally agrees to treatment plan.

## 2023-04-25 ENCOUNTER — Other Ambulatory Visit (HOSPITAL_COMMUNITY): Payer: BC Managed Care – PPO | Admitting: Licensed Clinical Social Worker

## 2023-04-25 ENCOUNTER — Other Ambulatory Visit (HOSPITAL_COMMUNITY): Payer: BC Managed Care – PPO

## 2023-04-25 DIAGNOSIS — Z9151 Personal history of suicidal behavior: Secondary | ICD-10-CM | POA: Diagnosis not present

## 2023-04-25 DIAGNOSIS — F17290 Nicotine dependence, other tobacco product, uncomplicated: Secondary | ICD-10-CM | POA: Diagnosis not present

## 2023-04-25 DIAGNOSIS — F332 Major depressive disorder, recurrent severe without psychotic features: Secondary | ICD-10-CM | POA: Diagnosis not present

## 2023-04-25 DIAGNOSIS — I252 Old myocardial infarction: Secondary | ICD-10-CM | POA: Diagnosis not present

## 2023-04-25 DIAGNOSIS — F4310 Post-traumatic stress disorder, unspecified: Secondary | ICD-10-CM | POA: Diagnosis not present

## 2023-04-25 DIAGNOSIS — F411 Generalized anxiety disorder: Secondary | ICD-10-CM | POA: Diagnosis not present

## 2023-04-25 DIAGNOSIS — Z658 Other specified problems related to psychosocial circumstances: Secondary | ICD-10-CM | POA: Diagnosis not present

## 2023-04-25 DIAGNOSIS — Z602 Problems related to living alone: Secondary | ICD-10-CM | POA: Diagnosis not present

## 2023-04-25 DIAGNOSIS — Z79899 Other long term (current) drug therapy: Secondary | ICD-10-CM | POA: Diagnosis not present

## 2023-04-25 DIAGNOSIS — G4700 Insomnia, unspecified: Secondary | ICD-10-CM | POA: Diagnosis not present

## 2023-04-25 DIAGNOSIS — R4589 Other symptoms and signs involving emotional state: Secondary | ICD-10-CM

## 2023-04-25 DIAGNOSIS — Z5986 Financial insecurity: Secondary | ICD-10-CM | POA: Diagnosis not present

## 2023-04-25 MED ORDER — TRAZODONE HCL 50 MG PO TABS: 50.0000 mg | ORAL_TABLET | Freq: Every day | ORAL | 0 refills | Status: DC

## 2023-04-25 MED ORDER — ARIPIPRAZOLE 5 MG PO TABS: 5.0000 mg | ORAL_TABLET | Freq: Every day | ORAL | 2 refills | Status: DC
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
## 2023-04-26 ENCOUNTER — Encounter (HOSPITAL_COMMUNITY): Payer: Self-pay

## 2023-04-26 ENCOUNTER — Other Ambulatory Visit (HOSPITAL_COMMUNITY): Payer: BC Managed Care – PPO

## 2023-04-26 ENCOUNTER — Other Ambulatory Visit (HOSPITAL_COMMUNITY): Payer: BC Managed Care – PPO | Admitting: Licensed Clinical Social Worker

## 2023-04-26 DIAGNOSIS — F4310 Post-traumatic stress disorder, unspecified: Secondary | ICD-10-CM | POA: Diagnosis not present

## 2023-04-26 DIAGNOSIS — I252 Old myocardial infarction: Secondary | ICD-10-CM | POA: Diagnosis not present

## 2023-04-26 DIAGNOSIS — F332 Major depressive disorder, recurrent severe without psychotic features: Secondary | ICD-10-CM | POA: Diagnosis not present

## 2023-04-26 DIAGNOSIS — Z5986 Financial insecurity: Secondary | ICD-10-CM | POA: Diagnosis not present

## 2023-04-26 DIAGNOSIS — F411 Generalized anxiety disorder: Secondary | ICD-10-CM | POA: Diagnosis not present

## 2023-04-26 DIAGNOSIS — Z9151 Personal history of suicidal behavior: Secondary | ICD-10-CM | POA: Diagnosis not present

## 2023-04-26 DIAGNOSIS — Z658 Other specified problems related to psychosocial circumstances: Secondary | ICD-10-CM | POA: Diagnosis not present

## 2023-04-26 DIAGNOSIS — F17290 Nicotine dependence, other tobacco product, uncomplicated: Secondary | ICD-10-CM | POA: Diagnosis not present

## 2023-04-26 DIAGNOSIS — Z79899 Other long term (current) drug therapy: Secondary | ICD-10-CM | POA: Diagnosis not present

## 2023-04-26 DIAGNOSIS — Z602 Problems related to living alone: Secondary | ICD-10-CM | POA: Diagnosis not present

## 2023-04-26 DIAGNOSIS — G4700 Insomnia, unspecified: Secondary | ICD-10-CM | POA: Diagnosis not present

## 2023-04-26 NOTE — Therapy (Signed)
[facility] PARTIAL HOSPITALIZATION PROGRAM [address] [place], [place], [address] Phone: [phone]   Fax:  [phone]  Occupational Therapy Treatment Virtual Visit via Video Note  I connected with Zachary [name] on 04/26/23 at  8:00 AM EST by a video enabled telemedicine application and verified that I am speaking with the correct person using two identifiers.  Location: Patient: home Provider: office   I discussed the limitations of evaluation and management by telemedicine and the availability of in person appointments. The patient expressed understanding and agreed to proceed.    The patient was advised to call back or seek an in-person evaluation if the symptoms worsen or if the condition fails to improve as anticipated.  I provided 55 minutes of non-face-to-face time during this encounter.   Patient Details  Name: Zachary [name] MRN: [id number] Date of Birth: January 17, 1971 No data recorded  Encounter Date: 04/25/2023   OT End of Session - 04/26/23 1655     Visit Number 2    Number of Visits 20    Date for OT Re-Evaluation 05/26/23    OT Start Time 1200    OT Stop Time 1255    OT Time Calculation (min) 55 min             Past Medical History:  Diagnosis Date   Anxiety    GERD (gastroesophageal reflux disease)    Hyperlipidemia    Hypertension    Myocardial infarction [facility])     Past Surgical History:  Procedure Laterality Date   CARDIAC CATHETERIZATION     HEMORRHOID SURGERY     HEMORRHOID SURGERY N/A 09/13/2014   Procedure: HEMORRHOIDECTOMY;  Surgeon: Renda Rolls, MD;  Location: [facility] ORS;  Service: General;  Laterality: N/A;    There were no vitals filed for this visit.   Subjective Assessment - 04/26/23 1655     Currently in Pain? No/denies    Pain Score 0-No pain               Group Session:  S: I'm doing oajy today.   O: During today's OT group session, the patient participated in an educational  segment about the importance of goal-setting and the application of the SMART framework to enhance daily life, particularly focusing on ADLs and iADLs. The session began with five open-ended pre-session questions that facilitated group discussion and introspection about their current relationship with goals. Following the introduction and educational segment, participants engaged in brainstorming and group discussions to devise hypothetical SMART goals. The session concluded with five post-session questions to reinforce understanding and facilitate reflection. Throughout the session, there was a range of engagement levels noted among the participants.   A:  Patient demonstrated a high level of engagement throughout the session. They actively participated in discussions, sharing personal experiences related to goal setting and challenges faced. Patient was able to clearly articulate an understanding of the SMART framework and proposed personal SMART goals related to their own ADLs with minimal assistance. They expressed enthusiasm about applying what they learned to their daily routine and appeared motivated to make changes.    P: Continue to attend PHP OT group sessions 5x week for 4 weeks to promote daily structure, social engagement, and opportunities to develop and utilize adaptive strategies to maximize functional performance in preparation for safe transition and integration back into school, work, and the community. Plan to address topic of tbd in next OT group session.  OT Education - 04/26/23 1655     Education Details Goal Setting              OT Short Term Goals - 04/26/23 1638       OT SHORT TERM GOAL #1   Title Patient will be educated on strategies to improve psychosocial skills needed to participate fully in all daily, work, and leisure activities.    Time 4    Period Weeks    Status On-going    Target Date 05/26/23      OT SHORT TERM  GOAL #2   Title Pt will apply psychosocial skills and coping mechanisms to daily activities in order to function independently and reintegrate into community dwelling    Status On-going      OT SHORT TERM GOAL #3   Title Pt will choose and/or engage in 1-3 socially engaging leisure activities to improve social participation skills upon reintegrating into community    Status On-going      OT SHORT TERM GOAL #4   Title Patient will be educated on strategies to improve psychosocial skills needed to participate fully in all daily, work, and leisure activities.    Status On-going                      Plan - 04/26/23 1656     Occupational performance deficits (Please refer to evaluation for details): Rest and Sleep;Leisure;Social Participation    Psychosocial Skills Coping Strategies;Interpersonal Interaction;Habits             Patient will benefit from skilled therapeutic intervention in order to improve the following deficits and impairments:       Psychosocial Skills: Coping Strategies, Interpersonal Interaction, Habits   Visit Diagnosis: Difficulty coping    Problem List Patient Active Problem List   Diagnosis Date Noted   MDD (major depressive disorder), recurrent episode, severe (HCC) 04/21/2023   PTSD (post-traumatic stress disorder) 04/21/2023   CAD (coronary atherosclerotic disease) 01/14/2023   History of MI (myocardial infarction) 01/14/2023   Tobacco use disorder 01/14/2023   STEMI involving left anterior descending coronary artery (HCC) 01/03/2023   Gout 10/07/2022   Primary hypertension 06/12/2021   Bilateral leg edema 06/12/2021   Depression, major, single episode, complete remission (HCC) 06/12/2021   GERD (gastroesophageal reflux disease) 09/15/2018   Hemorrhoids 09/15/2018   Obesity 09/15/2018    Zachary [name], OT 04/26/2023, 4:57 PM  Kerrin Champagne, OT   [facility] HOSPITALIZATION PROGRAM [address]  [address] [place], [place], [address] Phone: [phone]   Fax:  [phone]  Name: Zachary [name] MRN: [id number] Date of Birth: 20-May-1970

## 2023-04-26 NOTE — Therapy (Signed)
[facility] PARTIAL HOSPITALIZATION PROGRAM [address] [place], [place], [address] Phone: [phone]   Fax:  [phone]  Occupational Therapy Evaluation Virtual Visit via Video Note  I connected with Zachary [name] on 04/26/23 at  8:00 AM EST by a video enabled telemedicine application and verified that I am speaking with the correct person using two identifiers.  Location: Patient: home Provider: office   I discussed the limitations of evaluation and management by telemedicine and the availability of in person appointments. The patient expressed understanding and agreed to proceed.    The patient was advised to call back or seek an in-person evaluation if the symptoms worsen or if the condition fails to improve as anticipated.  I provided 85 minutes of non-face-to-face time during this encounter.   Patient Details  Name: Zachary [name] MRN: [id number] Date of Birth: 1970/08/20 No data recorded  Encounter Date: 04/22/2023   OT End of Session - 04/26/23 1636     Visit Number 1    Number of Visits 20    Date for OT Re-Evaluation 05/26/23    OT Start Time 0930    OT Stop Time 1255   eval: 30; Tx: 55   OT Time Calculation (min) 205 min    Activity Tolerance Patient tolerated treatment well    Behavior During Therapy WFL for tasks assessed/performed             Past Medical History:  Diagnosis Date   Anxiety    GERD (gastroesophageal reflux disease)    Hyperlipidemia    Hypertension    Myocardial infarction [facility])     Past Surgical History:  Procedure Laterality Date   CARDIAC CATHETERIZATION     HEMORRHOID SURGERY     HEMORRHOID SURGERY N/A 09/13/2014   Procedure: HEMORRHOIDECTOMY;  Surgeon: Zachary Rolls, MD;  Location: [facility] ORS;  Service: General;  Laterality: N/A;    There were no vitals filed for this visit.   Subjective Assessment - 04/26/23 1633     Subjective  I am interested in learning new coping skills while I  am here. Looking for "purpose" or "passion" perhaps. Huel Cote how to enjoy things again.    Pertinent History MDD, PTSD    Currently in Pain? No/denies    Pain Score 0-No pain                OT Assessment   OCAIRS Mental Health Interview Summary of Client Scores:  Facilitates participation in occupation Allows participation in occupation Inhibits participation in occupation Restricts participation in occupation Comments:  Roles   X    Habits   X    Personal Causation    X   Values   X    Interests   X    Skills   X    Short-Term Goals   X    Long-term Goals   X    Interpretation of Past Experiences  X     Physical Environment  X     Social Environment  X     Readiness for Change  X       Need for Occupational Therapy:  4 Shows positive occupational participation, no need for OT.   3 Need for minimal intervention/consultative participation   2 Need for OT intervention indicated to restore/improve participation   1 Need for extensive OT intervention indicated to improve participation.  Referral for follow up services also recommended.   Assessment:  Patient demonstrates behavior that  INHIBITS participation in occupation.  Patient will benefit from occupational therapy intervention in order to improve time management, financial management, stress management, job readiness skills, social skills, and health management skills in preparation to return to full time community living and to be a productive community member.    Plan:  Patient will participate in skilled occupational therapy sessions individually or in a group setting to improve coping skills, psychosocial skills, and emotional skills required to return to prior level of function. Treatment will be 4-5 times per week for 4 weeks.      Group Session:  O: The objective of the telehealth group therapy session was to discuss the significance of routines in promoting mental health and wellbeing. The OT aimed to explore the  power of routines in providing structure, stability, and predictability in individuals' lives, as well as their role in establishing healthy habits, reducing stress and anxiety, managing time effectively, achieving goals, and fostering a sense of community and social connectedness.   Participants were guided to identify areas where routines could improve their mental health and were provided with tips for establishing and maintaining healthy routines. The session concluded by emphasizing the potential of routines to enhance overall quality of life.    A: During the telehealth group therapy session, the patient actively participated in the discussion on the importance of routines in promoting mental health and wellbeing. The patient demonstrated a good understanding of the power of routines in providing structure, stability, and predictability in their life. They were able to identify areas in their life where routines could contribute to improving their overall mental health.  The patient showed motivation and willingness to reflect on their current habits and behaviors, recognizing the need for positive changes. They actively engaged in the session, sharing personal experiences and challenges related to establishing and maintaining healthy routines. The patient expressed a desire to reduce stress and anxiety, manage their time more effectively, and achieve their goals through the implementation of routines.                  OT Education - 04/26/23 1635     Education Details OCAIRS / Tx: Rtouines 4/4    Person(s) Educated Patient    Methods Handout;Explanation    Comprehension Verbalized understanding              OT Short Term Goals - 04/26/23 1638       OT SHORT TERM GOAL #1   Title Patient will be educated on strategies to improve psychosocial skills needed to participate fully in all daily, work, and leisure activities.    Time 4    Period Weeks    Status On-going     Target Date 05/26/23      OT SHORT TERM GOAL #2   Title Pt will apply psychosocial skills and coping mechanisms to daily activities in order to function independently and reintegrate into community dwelling    Status On-going      OT SHORT TERM GOAL #3   Title Pt will choose and/or engage in 1-3 socially engaging leisure activities to improve social participation skills upon reintegrating into community    Status On-going      OT SHORT TERM GOAL #4   Title Patient will be educated on strategies to improve psychosocial skills needed to participate fully in all daily, work, and leisure activities.    Status On-going  Plan - 04/26/23 1636     Clinical Impression Statement Pt presents w/ deficits across multiple psychosocial domains that direclty inhibit participation in various daily activites and overall occupational performance.    OT Occupational Profile and History Problem Focused Assessment - Including review of records relating to presenting problem    Occupational performance deficits (Please refer to evaluation for details): Rest and Sleep;Leisure;Social Participation    Psychosocial Skills Coping Strategies;Interpersonal Interaction;Habits    Rehab Potential Good    Clinical Decision Making Limited treatment options, no task modification necessary    Comorbidities Affecting Occupational Performance: None    Modification or Assistance to Complete Evaluation  No modification of tasks or assist necessary to complete eval    OT Frequency 5x / week    OT Duration 4 weeks    OT Treatment/Interventions Psychosocial skills training;Coping strategies training    Consulted and Agree with Plan of Care Patient             Patient will benefit from skilled therapeutic intervention in order to improve the following deficits and impairments:       Psychosocial Skills: Coping Strategies, Interpersonal Interaction, Habits   Visit Diagnosis: Difficulty  coping  Severe episode of recurrent major depressive disorder, without psychotic features (HCC)  PTSD (post-traumatic stress disorder)    Problem List Patient Active Problem List   Diagnosis Date Noted   MDD (major depressive disorder), recurrent episode, severe (HCC) 04/21/2023   PTSD (post-traumatic stress disorder) 04/21/2023   CAD (coronary atherosclerotic disease) 01/14/2023   History of MI (myocardial infarction) 01/14/2023   Tobacco use disorder 01/14/2023   STEMI involving left anterior descending coronary artery (HCC) 01/03/2023   Gout 10/07/2022   Primary hypertension 06/12/2021   Bilateral leg edema 06/12/2021   Depression, major, single episode, complete remission (HCC) 06/12/2021   GERD (gastroesophageal reflux disease) 09/15/2018   Hemorrhoids 09/15/2018   Obesity 09/15/2018    Ted Mcalpine, OT 04/26/2023, 4:40 PM  Kerrin Champagne, OT   [facility] HOSPITALIZATION PROGRAM [address] [place], [place], [address] Phone: [phone]   Fax:  [phone]  Name: AIDAN LINEWEAVER MRN: [id number] Date of Birth: 06/23/1970

## 2023-04-27 ENCOUNTER — Other Ambulatory Visit (HOSPITAL_COMMUNITY): Payer: BC Managed Care – PPO

## 2023-04-28 ENCOUNTER — Other Ambulatory Visit (HOSPITAL_COMMUNITY): Payer: BC Managed Care – PPO | Admitting: Licensed Clinical Social Worker

## 2023-04-28 ENCOUNTER — Other Ambulatory Visit (HOSPITAL_COMMUNITY): Payer: BC Managed Care – PPO

## 2023-04-28 DIAGNOSIS — F4310 Post-traumatic stress disorder, unspecified: Secondary | ICD-10-CM | POA: Diagnosis not present

## 2023-04-28 DIAGNOSIS — G4700 Insomnia, unspecified: Secondary | ICD-10-CM | POA: Diagnosis not present

## 2023-04-28 DIAGNOSIS — F332 Major depressive disorder, recurrent severe without psychotic features: Secondary | ICD-10-CM | POA: Diagnosis not present

## 2023-04-28 DIAGNOSIS — Z79899 Other long term (current) drug therapy: Secondary | ICD-10-CM | POA: Diagnosis not present

## 2023-04-28 DIAGNOSIS — F17290 Nicotine dependence, other tobacco product, uncomplicated: Secondary | ICD-10-CM | POA: Diagnosis not present

## 2023-04-28 DIAGNOSIS — Z5986 Financial insecurity: Secondary | ICD-10-CM | POA: Diagnosis not present

## 2023-04-28 DIAGNOSIS — Z9151 Personal history of suicidal behavior: Secondary | ICD-10-CM | POA: Diagnosis not present

## 2023-04-28 DIAGNOSIS — Z602 Problems related to living alone: Secondary | ICD-10-CM | POA: Diagnosis not present

## 2023-04-28 DIAGNOSIS — F411 Generalized anxiety disorder: Secondary | ICD-10-CM | POA: Diagnosis not present

## 2023-04-28 DIAGNOSIS — I252 Old myocardial infarction: Secondary | ICD-10-CM | POA: Diagnosis not present

## 2023-04-28 DIAGNOSIS — Z658 Other specified problems related to psychosocial circumstances: Secondary | ICD-10-CM | POA: Diagnosis not present

## 2023-04-29 ENCOUNTER — Other Ambulatory Visit (HOSPITAL_BASED_OUTPATIENT_CLINIC_OR_DEPARTMENT_OTHER): Payer: BC Managed Care – PPO | Admitting: Professional

## 2023-04-29 ENCOUNTER — Telehealth (HOSPITAL_COMMUNITY): Payer: Self-pay | Admitting: Licensed Clinical Social Worker

## 2023-04-29 ENCOUNTER — Other Ambulatory Visit (HOSPITAL_COMMUNITY): Payer: BC Managed Care – PPO

## 2023-04-29 DIAGNOSIS — F332 Major depressive disorder, recurrent severe without psychotic features: Secondary | ICD-10-CM

## 2023-04-29 DIAGNOSIS — F4310 Post-traumatic stress disorder, unspecified: Secondary | ICD-10-CM

## 2023-04-29 NOTE — Progress Notes (Signed)
Virtual Visit via Video Note  I connected with Zachary [name] on 04/29/23 at  9:00 AM EST by a video enabled telemedicine application and verified that I am speaking with the correct person using two identifiers.  Location: Patient: Home Provider: Office   I discussed the limitations of evaluation and management by telemedicine and the availability of in person appointments. The patient expressed understanding and agreed to proceed.   I discussed the assessment and treatment plan with the patient. The patient was provided an opportunity to ask questions and all were answered. The patient agreed with the plan and demonstrated an understanding of the instructions.   The patient was advised to call back or seek an in-person evaluation if the symptoms worsen or if the condition fails to improve as anticipated.  I provided 15 minutes of non-face-to-face time during this encounter.   [name], NP   BH MD/PA/NP OP Progress Note  04/29/2023 10:10 AM Zachary [name]  MRN:  [id number]  Chief Complaint: Zachary [name] states " I am doing okay I guess."   HPI: Zachary [name] 52 year old was seen and evaluated via video assessment.  Patient carries a diagnosis with major depressive disorder, generalized anxiety disorder and anger management issues.  He presents flat, guarded slightly irritable.  He reports multiple complaints related to back pain and GERD flareups.  Zachary [name] states he recently started Abilify 5 mg 1 day ago patient is not attributing his GERD symptoms to medication.  education provided with monitoring symptoms.  No concerns related to suicidal or homicidal ideations.  Rating his depression and anxiety 8 out of 10 with 10 being the worst.   Zachary [name] reports a fair appetite.  States he is resting okay throughout the night. Zachary [name] is sitting on the couch with is dog. he is alert/oriented x 4; calm/cooperative; and mood is flat and depressed.    Patient is speaking in a  clear tone at moderate volume, and normal pace; with good eye contact. His thought process is coherent and relevant; There is no indication that he is currently responding to internal/external stimuli or experiencing delusional thought content.  Zachary [name] denies suicidal/self-harm/homicidal ideation, psychosis, and paranoia.  Patient has remained calm throughout assessment and has answered questions appropriately.    Visit Diagnosis:    ICD-10-CM   1. Severe episode of recurrent major depressive disorder, without psychotic features (HCC)  F33.2       Past Psychiatric History:   Past Medical History:  Past Medical History:  Diagnosis Date   Anxiety    GERD (gastroesophageal reflux disease)    Hyperlipidemia    Hypertension    Myocardial infarction [facility])     Past Surgical History:  Procedure Laterality Date   CARDIAC CATHETERIZATION     HEMORRHOID SURGERY     HEMORRHOID SURGERY N/A 09/13/2014   Procedure: HEMORRHOIDECTOMY;  Surgeon: Renda Rolls, MD;  Location: [facility] ORS;  Service: General;  Laterality: N/A;    Family Psychiatric History:   Family History:  Family History  Problem Relation Age of Onset   Heart disease Maternal Grandfather 67    Social History:  Social History   Socioeconomic History   Marital status: Single    Spouse name: Not on file   Number of children: Not on file   Years of education: Not on file   Highest education level: Not on file  Occupational History   Not on file  Tobacco Use   Smoking status: Every Day    Types:  Cigars   Smokeless tobacco: Never   Tobacco comments:    5cigars weekly  Substance and Sexual Activity   Alcohol use: Yes    Comment: occassionally   Drug use: No   Sexual activity: Yes  Other Topics Concern   Not on file  Social History Narrative   Not on file   Social Drivers of Health   Financial Resource Strain: Not on file  Food Insecurity: Low Risk  (01/03/2023)   Received from [facility]   Hunger Vital Sign     Worried About Running Out of Food in the Last Year: Never true    Ran Out of Food in the Last Year: Never true  Transportation Needs: No Transportation Needs (01/03/2023)   Received from [facility]    In the past 12 months, has lack of reliable transportation kept you from medical appointments, meetings, work or from getting things needed for daily living? : No  Physical Activity: Not on file  Stress: Not on file  Social Connections: Not on file    Allergies: No Known Allergies  Metabolic Disorder Labs: No results found for: "HGBA1C", "MPG" No results found for: "PROLACTIN" Lab Results  Component Value Date   CHOL 216 (A) 03/19/2021   TRIG 192 (A) 03/19/2021   HDL 45 03/19/2021   CHOLHDL 4.9 CALC 05/23/2006   VLDL 53 (H) 05/23/2006   LDLCALC 137 03/19/2021   Lab Results  Component Value Date   TSH 0.81 03/19/2021    Therapeutic Level Labs: No results found for: "LITHIUM" No results found for: "VALPROATE" No results found for: "CBMZ"  Current Medications: Current Outpatient Medications  Medication Sig Dispense Refill   allopurinol (ZYLOPRIM) 100 MG tablet Take 1 tablet (100 mg total) by mouth daily. 90 tablet 1   ARIPiprazole (ABILIFY) 5 MG tablet Take 1 tablet (5 mg total) by mouth daily. 30 tablet 2   aspirin 81 MG chewable tablet Chew by mouth.     atorvastatin (LIPITOR) 80 MG tablet Take 1 tablet by mouth at bedtime.     escitalopram (LEXAPRO) 20 MG tablet Take 20 mg by mouth daily.     esomeprazole (NEXIUM) 20 MG capsule Take 1 capsule (20 mg total) by mouth 2 (two) times daily. 180 capsule 0   furosemide (LASIX) 20 MG tablet Take 1 tablet by mouth daily.     hydrochlorothiazide (HYDRODIURIL) 12.5 MG tablet Take 12.5 mg by mouth daily.     isosorbide mononitrate (IMDUR) 30 MG 24 hr tablet Take 1 tablet by mouth daily.     losartan (COZAAR) 25 MG tablet Take 0.5 tablets by mouth daily.     Melatonin 5 MG CAPS Take 1 capsule by mouth at bedtime.      ticagrelor (BRILINTA) 90 MG TABS tablet Take by mouth.     tirzepatide (ZEPBOUND) 15 MG/0.5ML Pen Inject 15 mg into the skin once a week. 2 mL 1   traZODone (DESYREL) 50 MG tablet Take 1 tablet (50 mg total) by mouth at bedtime. 30 tablet 0   No current facility-administered medications for this visit.     Musculoskeletal:  Psychiatric Specialty Exam: Review of Systems  There were no vitals taken for this visit.There is no height or weight on file to calculate BMI.  General Appearance: Casual  Eye Contact:  Good  Speech:  Clear and Coherent  Volume:  Normal  Mood:  Anxious and Depressed  Affect:  Congruent  Thought Process:  Coherent  Orientation:  Full (Time, Place, and Person)  Thought Content: Logical   Suicidal Thoughts:  No  Homicidal Thoughts:  No  Memory:  Immediate;   Good  Judgement:  Good  Insight:  Good  Psychomotor Activity:  Normal  Concentration:  Concentration: Good  Recall:  Good  Fund of Knowledge: Good  Language: Good  Akathisia:  No  Handed:  Right  AIMS (if indicated): not done  Assets:  Communication Skills Desire for Improvement  ADL's:  Intact  Cognition: WNL  Sleep:  Good   Screenings: GAD-7    Flowsheet Row Office Visit from 01/14/2023 in [facility] [employer]  Total GAD-7 Score 5      PHQ2-9    Flowsheet Row Counselor from 04/21/2023 in [facility] PART[facility] HOSPITALIZATION PROGRAM Video Visit from 03/23/2023 in [facility] [employer] Visit from 01/14/2023 in [facility] [employer] Visit from 10/07/2022 in [facility] [employer] Visit from 06/12/2021 in [facility] [employer]  PHQ-2 Total Score 3 0 2 1 0  PHQ-9 Total Score 19 -- 7 4 --      Flowsheet Row Counselor from 04/21/2023 in BEHAVIORAL HEALTH PARTIAL HOSPITALIZATION PROGRAM ED from 04/13/2023 in [facility]  C-SSRS RISK CATEGORY High Risk High Risk        Assessment and Plan:  Continue Partial Hospitalization -recently started Abilify 5 mg daily-reports started medication last night,1 dose -Continue Lexapro 40 mg  Collaboration of Care: Collaboration of Care: Medication Management AEB Continue Abilify 5 mg daily   Patient/Guardian was advised Release of Information must be obtained prior to any record release in order to collaborate their care with an outside provider. Patient/Guardian was advised if they have not already done so to contact the registration department to sign all necessary forms in order for [place] to release information regarding their care.   Consent: Patient/Guardian gives verbal consent for treatment and assignment of benefits for services provided during this visit. Patient/Guardian expressed understanding and agreed to proceed.    [name], NP 04/29/2023, 10:10 AM

## 2023-05-02 ENCOUNTER — Other Ambulatory Visit (HOSPITAL_BASED_OUTPATIENT_CLINIC_OR_DEPARTMENT_OTHER): Payer: BC Managed Care – PPO | Admitting: Professional

## 2023-05-02 ENCOUNTER — Other Ambulatory Visit (HOSPITAL_COMMUNITY): Payer: BC Managed Care – PPO

## 2023-05-02 DIAGNOSIS — F4310 Post-traumatic stress disorder, unspecified: Secondary | ICD-10-CM

## 2023-05-02 DIAGNOSIS — F332 Major depressive disorder, recurrent severe without psychotic features: Secondary | ICD-10-CM

## 2023-05-02 NOTE — Progress Notes (Cosign Needed)
BH MD/PA/NP OP Progress Note  05/02/2023 11:01 AM [name]  MRN:  [id number]  Chief Complaint: Zachary [name] stated that " I discontinued my Abilify and I am not sleeping."  HPI: Zachary [name] seen and evaluated via video assessment.  He presents with a flat and blunted affect.  States he had discontinued Abilify 5 mg that was recently started. Stated he hasn't take his Abilify in the past 2 days due to dizziness and headaches.   Reports reporting decreased blood pressure when taking medications.  Aquarius reports his symptoms improved since discontinuing medications.  Does report concerns related to sleep disturbance.  He is currently prescribed trazodone 50 mg nightly.  States medication is not helping stay asleep.  Discussed titrating trazodone to 100 mg nightly.  He was receptive to plan.  Will follow-up 05-22-2022.   Zachary [name] is alert/oriented x 4; calm/cooperative; and presented with a flat and blunted  affect. Patient is speaking in a clear tone at moderate volume, and normal pace; with good eye contact.  His thought process is coherent and relevant; There is no indication that he is currently responding to internal/external stimuli or experiencing delusional thought content.  Patient denies suicidal/self-harm/homicidal ideation, psychosis, and paranoia.  Patient has remained calm throughout assessment and has answered questions appropriately.   Visit Diagnosis:    ICD-10-CM   1. Severe episode of recurrent major depressive disorder, without psychotic features (HCC)  F33.2     2. PTSD (post-traumatic stress disorder)  F43.10       Past Psychiatric History: see chart  Past Medical History:  Past Medical History:  Diagnosis Date   Anxiety    GERD (gastroesophageal reflux disease)    Hyperlipidemia    Hypertension    Myocardial infarction [facility])     Past Surgical History:  Procedure Laterality Date   CARDIAC CATHETERIZATION     HEMORRHOID SURGERY     HEMORRHOID SURGERY N/A  09/13/2014   Procedure: HEMORRHOIDECTOMY;  Surgeon: Renda Rolls, MD;  Location: [facility] ORS;  Service: General;  Laterality: N/A;    Family Psychiatric History:   Family History:  Family History  Problem Relation Age of Onset   Heart disease Maternal Grandfather 39    Social History:  Social History   Socioeconomic History   Marital status: Single    Spouse name: Not on file   Number of children: Not on file   Years of education: Not on file   Highest education level: Not on file  Occupational History   Not on file  Tobacco Use   Smoking status: Every Day    Types: Cigars   Smokeless tobacco: Never   Tobacco comments:    5cigars weekly  Substance and Sexual Activity   Alcohol use: Yes    Comment: occassionally   Drug use: No   Sexual activity: Yes  Other Topics Concern   Not on file  Social History Narrative   Not on file   Social Drivers of Health   Financial Resource Strain: Not on file  Food Insecurity: Low Risk  (01/03/2023)   Received from [facility]   Hunger Vital Sign    Worried About Running Out of Food in the Last Year: Never true    Ran Out of Food in the Last Year: Never true  Transportation Needs: No Transportation Needs (01/03/2023)   Received from [facility]    In the past 12 months, has lack of reliable transportation kept you from medical appointments, meetings,  work or from getting things needed for daily living? : No  Physical Activity: Not on file  Stress: Not on file  Social Connections: Not on file    Allergies: No Known Allergies  Metabolic Disorder Labs: No results found for: "HGBA1C", "MPG" No results found for: "PROLACTIN" Lab Results  Component Value Date   CHOL 216 (A) 03/19/2021   TRIG 192 (A) 03/19/2021   HDL 45 03/19/2021   CHOLHDL 4.9 CALC 05/23/2006   VLDL 53 (H) 05/23/2006   LDLCALC 137 03/19/2021   Lab Results  Component Value Date   TSH 0.81 03/19/2021    Therapeutic Level Labs: No results  found for: "LITHIUM" No results found for: "VALPROATE" No results found for: "CBMZ"  Current Medications: Current Outpatient Medications  Medication Sig Dispense Refill   allopurinol (ZYLOPRIM) 100 MG tablet Take 1 tablet (100 mg total) by mouth daily. 90 tablet 1   ARIPiprazole (ABILIFY) 5 MG tablet Take 1 tablet (5 mg total) by mouth daily. 30 tablet 2   aspirin 81 MG chewable tablet Chew by mouth.     atorvastatin (LIPITOR) 80 MG tablet Take 1 tablet by mouth at bedtime.     escitalopram (LEXAPRO) 20 MG tablet Take 20 mg by mouth daily.     esomeprazole (NEXIUM) 20 MG capsule Take 1 capsule (20 mg total) by mouth 2 (two) times daily. 180 capsule 0   furosemide (LASIX) 20 MG tablet Take 1 tablet by mouth daily.     hydrochlorothiazide (HYDRODIURIL) 12.5 MG tablet Take 12.5 mg by mouth daily.     isosorbide mononitrate (IMDUR) 30 MG 24 hr tablet Take 1 tablet by mouth daily.     losartan (COZAAR) 25 MG tablet Take 0.5 tablets by mouth daily.     Melatonin 5 MG CAPS Take 1 capsule by mouth at bedtime.     ticagrelor (BRILINTA) 90 MG TABS tablet Take by mouth.     tirzepatide (ZEPBOUND) 15 MG/0.5ML Pen Inject 15 mg into the skin once a week. 2 mL 1   traZODone (DESYREL) 50 MG tablet Take 1 tablet (50 mg total) by mouth at bedtime. 30 tablet 0   No current facility-administered medications for this visit.     Musculoskeletal: Video-assessment   Psychiatric Specialty Exam: Review of Systems  Psychiatric/Behavioral:  Positive for decreased concentration and sleep disturbance. The patient is nervous/anxious.   All other systems reviewed and are negative.   There were no vitals taken for this visit.There is no height or weight on file to calculate BMI.  General Appearance: Casual  Eye Contact:  Good  Speech:  Clear and Coherent  Volume:  Normal  Mood:  Anxious and Depressed  Affect:  Blunt, Depressed, and Flat  Thought Process:  Coherent  Orientation:  Full (Time, Place, and  Person)  Thought Content: Logical   Suicidal Thoughts:  No  Homicidal Thoughts:  No  Memory:  Immediate;   Good  Judgement:  Good  Insight:  Good  Psychomotor Activity:  Normal  Concentration:  Concentration: Good  Recall:  Good  Fund of Knowledge: Good  Language: Good  Akathisia:  No  Handed:  Right  AIMS (if indicated): done  Assets:  Communication Skills Social Support  ADL's:  Intact  Cognition: WNL  Sleep:  Good   Screenings: GAD-7    Flowsheet Row Office Visit from 01/14/2023 in [facility] [employer]  Total GAD-7 Score 5      PHQ2-9    Flowsheet  Row Counselor from 04/21/2023 in BEHAVIORAL HEALTH PARTIAL HOSPITALIZATION PROGRAM Video Visit from 03/23/2023 in [facility] Visit from 01/14/2023 in [facility] Visit from 10/07/2022 in [facility] [employer] Visit from 06/12/2021 in [facility] [employer]  PHQ-2 Total Score 3 0 2 1 0  PHQ-9 Total Score 19 -- 7 4 --      Flowsheet Row Counselor from 04/21/2023 in BEHAVIORAL HEALTH PARTIAL HOSPITALIZATION PROGRAM ED from 04/13/2023 in [facility]  C-SSRS RISK CATEGORY High Risk High Risk        Assessment and Plan:  Continue partial hospitalization programming Discontinue Abilify due to patient reported symptoms related to dizziness and hypotension Discussed taking trazodone 50 mg may repeat 50 mg nightly as needed total of 100 mg nightly.  Collaboration of Care: Collaboration of Care: Medication Management AEB continue trazodone 50 to 100 mg nightly for sleep disturbance  Patient/Guardian was advised Release of Information must be obtained prior to any record release in order to collaborate their care with an outside provider. Patient/Guardian was advised if they have not already done so to contact the  registration department to sign all necessary forms in order for [place] to release information regarding their care.   Consent: Patient/Guardian gives verbal consent for treatment and assignment of benefits for services provided during this visit. Patient/Guardian expressed understanding and agreed to proceed.    [name], NP 05/02/2023, 11:01 AM

## 2023-05-02 NOTE — Psych (Signed)
Virtual Visit via Video [name]  I connected with Zachary [name] on 04/29/23 at  9:00 AM EST by a video enabled telemedicine application and verified that I am speaking with the correct person using two identifiers.  Location: Patient: Home Provider: Clinical Home Office   I discussed the limitations of evaluation and management by telemedicine and the availability of in person appointments. The patient expressed understanding and agreed to proceed.  Follow Up Instructions:    I discussed the assessment and treatment plan with the patient. The patient was provided an opportunity to ask questions and all were answered. The patient agreed with the plan and demonstrated an understanding of the instructions.   The patient was advised to call back or seek an in-person evaluation if the symptoms worsen or if the condition fails to improve as anticipated.  I provided 108 minutes of non-face-to-face time during this encounter.   Zachary [name], [facility]   [facility] BH PHP THERAPIST PROGRESS [name]  Zachary [name] [id number]  Session Time: 9:00-10:00  Participation Level: Minimal  Behavioral Response: CasualLethargicDepressed  Type of Therapy: Group Therapy  Treatment Goals addressed: Coping  Progress Towards Goals: Progressing  Interventions: CBT, DBT, Solution Focused, Strength-based, Supportive, and Reframing  Summary: Zachary [name] is a 52 y.o. male who presents with depression and PTSD symptoms. Clinician led check-in regarding current stressors and situation, and review of patient completed daily inventory. Clinician utilized active listening and empathetic response and validated patient emotions. Clinician facilitated processing group on pertinent issues.?    Therapist Response: Patient arrived within time allowed. Patient rates his mood at an 4 on a scale of 1-10 with 10 being best. Pt states he feels "in pain and tired" Pt states he slept 6-7 hours and appetite is still  decreased. Pt reports he is in a lot of physical pain due to a migraine and GERD. Patient able to process. Patient engaged in discussion.  Session Time: 10:00 am - 11:00 am   Participation Level: Minimal   Behavioral Response: CasualAlertDepressed   Type of Therapy: Group Therapy   Treatment Goals addressed: Coping   Progress Towards Goals: Progressing   Interventions: CBT, DBT, Solution Focused, Strength-based, Supportive, and Reframing   Therapist Response: Cln led discussion on intrusive thoughts. Cln shares people are not responsible for their first thought, but their second and the actions following the thoughts. Cln and group discussed separating thoughts from actions. Cln and group discussed life being about choices and how that can be empowering. Cln and group discussed importance of focusing on what is within one's control, instead of what is outside of one's control. Therapist Response: Pt did not participate in discussion. Pt left at 10:48 stating his migraine was bad and he could not continue in group today. Zachary [name] called to check on SI/HI due to pt leaving before cln could ask. See call log.    Suicidal/Homicidal: Nowithout intent/plan  Plan: Pt will continue in PHP while working to decrease depression and anxiety symptoms, decreased pSI, increase emotion regulation, and increase ability to manage symptoms in a healthy manner.   Collaboration of Care: Medication Management AEB Zachary [name]  Patient/Guardian was advised Release of Information must be obtained prior to any record release in order to collaborate their care with an outside provider. Patient/Guardian was advised if they have not already done so to contact the registration department to sign all necessary forms in order for [place] to release information regarding their care.   Consent: Patient/Guardian gives  verbal consent for treatment and assignment of benefits for services provided during this visit.  Patient/Guardian expressed understanding and agreed to proceed.   Diagnosis: Severe episode of recurrent major depressive disorder, without psychotic features (Zachary) [F33.2]    1. Severe episode of recurrent major depressive disorder, without psychotic features (Zachary)   2. PTSD (post-traumatic stress disorder)       Zachary [name], [name]

## 2023-05-02 NOTE — Psych (Signed)
Virtual Visit via Video Note  I connected with Zachary [name] on 04/29/23 at  9:00 AM EST by a video enabled telemedicine application and verified that I am speaking with the correct person using two identifiers.  Location: Patient: Home Provider: Clinical Home Office   I discussed the limitations of evaluation and management by telemedicine and the availability of in person appointments. The patient expressed understanding and agreed to proceed.  Follow Up Instructions:    I discussed the assessment and treatment plan with the patient. The patient was provided an opportunity to ask questions and all were answered. The patient agreed with the plan and demonstrated an understanding of the instructions.   The patient was advised to call back or seek an in-person evaluation if the symptoms worsen or if the condition fails to improve as anticipated.  I provided 154 minutes of non-face-to-face time during this encounter.   Zachary [name], [facility]   [facility] BH PHP THERAPIST PROGRESS NOTE  Zachary [name] [id number]  Session Time: 9:00-10:00  Participation Level: Minimal  Behavioral Response: CasualLethargicDepressed  Type of Therapy: Group Therapy  Treatment Goals addressed: Coping  Progress Towards Goals: Progressing  Interventions: CBT, DBT, Solution Focused, Strength-based, Supportive, and Reframing  Summary: Zachary [name] is a 52 y.o. male who presents with depression and PTSD symptoms. Clinician led check-in regarding current stressors and situation, and review of patient completed daily inventory. Clinician utilized active listening and empathetic response and validated patient emotions. Clinician facilitated processing group on pertinent issues.?    Therapist Response: Patient arrived within time allowed. Patient rates his mood at a 3 on a scale of 1-10 with 10 being best. Pt states he feels "tired" Pt states he slept 3 hours and appetite is still decreased. Pt reports he  had a bad weekend due to physical symptoms related to lowered blood pressure. Pt shares he is able to get out and socialize almost daily at his cigar club, which helps his mood. Patient able to process. Patient engaged in discussion.  Session Time: 10:00 am - 11:00 am   Participation Level: Active   Behavioral Response: CasualAlertDepressed   Type of Therapy: Group Therapy   Treatment Goals addressed: Coping   Progress Towards Goals: Progressing   Interventions: CBT, DBT, Solution Focused, Strength-based, Supportive, and Reframing   Summary: Cln led discussion on parts of the mind: emotional, rational/reasonable, and wise. Cln led discussion on how these areas of the mind can work in different ways depending on situations.  Therapist Response: Pt participated in discussion and reports he feels he lives in emotional mind and would like to work towards being in wise mind more often.  Session Time: 11:00 -12:00   Participation Level: Active   Behavioral Response: CasualAlertDepressed   Type of Therapy: Group Therapy   Treatment Goals addressed: Coping   Progress Towards Goals: Progressing   Interventions: CBT, DBT, Solution Focused, Strength-based, Supportive, and Reframing   Summary: Clinician introduced topic of "Radical Acceptance". Group discussed the why, how, and what skills for radical acceptance.     Therapist Response: Pt engaged in discussion. Pt left at 11:34 for a previously scheduled doctor's appointment.    Suicidal/Homicidal: Nowithout intent/plan  Plan: Pt will continue in PHP while working to decrease depression and anxiety symptoms, decreased pSI, increase emotion regulation, and increase ability to manage symptoms in a healthy manner.   Collaboration of Care: Medication Management AEB Zachary [name]  Patient/Guardian was advised Release of Information must be obtained prior to  any record release in order to collaborate their care with an outside provider.  Patient/Guardian was advised if they have not already done so to contact the registration department to sign all necessary forms in order for [place] to release information regarding their care.   Consent: Patient/Guardian gives verbal consent for treatment and assignment of benefits for services provided during this visit. Patient/Guardian expressed understanding and agreed to proceed.   Diagnosis: Severe episode of recurrent major depressive disorder, without psychotic features (HCC) [F33.2]    1. Severe episode of recurrent major depressive disorder, without psychotic features (HCC)   2. PTSD (post-traumatic stress disorder)       Zachary [name], [facility]

## 2023-05-03 ENCOUNTER — Encounter (HOSPITAL_COMMUNITY): Payer: Self-pay

## 2023-05-03 ENCOUNTER — Other Ambulatory Visit (HOSPITAL_COMMUNITY): Payer: BC Managed Care – PPO

## 2023-05-03 ENCOUNTER — Other Ambulatory Visit (HOSPITAL_COMMUNITY): Payer: BC Managed Care – PPO | Admitting: Professional

## 2023-05-03 DIAGNOSIS — F4310 Post-traumatic stress disorder, unspecified: Secondary | ICD-10-CM | POA: Diagnosis not present

## 2023-05-03 DIAGNOSIS — Z79899 Other long term (current) drug therapy: Secondary | ICD-10-CM | POA: Diagnosis not present

## 2023-05-03 DIAGNOSIS — F332 Major depressive disorder, recurrent severe without psychotic features: Secondary | ICD-10-CM | POA: Diagnosis not present

## 2023-05-03 DIAGNOSIS — Z5986 Financial insecurity: Secondary | ICD-10-CM | POA: Diagnosis not present

## 2023-05-03 DIAGNOSIS — Z658 Other specified problems related to psychosocial circumstances: Secondary | ICD-10-CM | POA: Diagnosis not present

## 2023-05-03 DIAGNOSIS — I252 Old myocardial infarction: Secondary | ICD-10-CM | POA: Diagnosis not present

## 2023-05-03 DIAGNOSIS — Z602 Problems related to living alone: Secondary | ICD-10-CM | POA: Diagnosis not present

## 2023-05-03 DIAGNOSIS — Z9151 Personal history of suicidal behavior: Secondary | ICD-10-CM | POA: Diagnosis not present

## 2023-05-03 DIAGNOSIS — F17290 Nicotine dependence, other tobacco product, uncomplicated: Secondary | ICD-10-CM | POA: Diagnosis not present

## 2023-05-03 DIAGNOSIS — F411 Generalized anxiety disorder: Secondary | ICD-10-CM | POA: Diagnosis not present

## 2023-05-03 DIAGNOSIS — R4589 Other symptoms and signs involving emotional state: Secondary | ICD-10-CM

## 2023-05-03 DIAGNOSIS — G4700 Insomnia, unspecified: Secondary | ICD-10-CM | POA: Diagnosis not present

## 2023-05-03 NOTE — Psych (Signed)
Virtual Visit via Video Note  I connected with Zachary [name] on 04/26/23 at  9:00 AM EST by a video enabled telemedicine application and verified that I am speaking with the correct person using two identifiers.  Location: Patient: patient home Provider: clinical home office   I discussed the limitations of evaluation and management by telemedicine and the availability of in person appointments. The patient expressed understanding and agreed to proceed.  I discussed the assessment and treatment plan with the patient. The patient was provided an opportunity to ask questions and all were answered. The patient agreed with the plan and demonstrated an understanding of the instructions.   The patient was advised to call back or seek an in-person evaluation if the symptoms worsen or if the condition fails to improve as anticipated.  Pt was provided 240 minutes of non-face-to-face time during this encounter.   Donia Guiles, LCSW   [facility] BH PHP THERAPIST PROGRESS NOTE  Zachary [name] [id number]  Session Time: 9:00 - 10:00  Participation Level: Active  Behavioral Response: CasualAlertDepressed  Type of Therapy: Group Therapy  Treatment Goals addressed: Coping  Progress Towards Goals: Initial  Interventions: CBT, DBT, Supportive, and Reframing  Summary: Zachary [name] is a 52 y.o. male who presents with depression symptoms.  Clinician led check-in regarding current stressors and situation, and review of patient completed daily inventory. Clinician utilized active listening and empathetic response and validated patient emotions. Clinician facilitated processing group on pertinent issues.?    Therapist Response:  Patient arrived within time allowed. Patient rates his mood at a 7 on a scale of 1-10 with 10 being best. Pt states he feels "okay." Pt states he slept 6 hours and ate 1x. Pt reports he had a "good night" last night and did a tradition with his ex-wife and mother-in-law.  Pt states they went to dinner and looked at Christmas lights. Pt states he also went to the grocery store and was able to do so without trouble. Pt exhibits flat affect. Patient able to process. Patient engaged in discussion.          Session Time: 10:00 am - 11:00 am   Participation Level: Active   Behavioral Response: CasualAlertDepressed   Type of Therapy: Group Therapy   Treatment Goals addressed: Coping   Progress Towards Goals: Progressing   Interventions: CBT, DBT, Solution Focused, Strength-based, Supportive, and Reframing   Summary: Cln led discussion on family dynamics and the way in which they impact [place]. Group members shared struggles with their families and the way the patterns of behaviors have negatively impacted them. Cln provided space to process and validated pt's experiences.    Therapist Response: Pt engaged in discussion and is able to identify current struggles in their family dynamics. Pt able to process.           Session Time: 11:00 -12:00   Participation Level: Active   Behavioral Response: CasualAlertDepressed   Type of Therapy: Group Therapy   Treatment Goals addressed: Coping   Progress Towards Goals: Progressing   Interventions: CBT, DBT, Solution Focused, Strength-based, Supportive, and Reframing   Summary: Cln led discussion on the holidays and held space for group members to process plans, feelings, and worries regarding the upcoming holiday. Cln brought in DBT ACCEPTS skills, boundaries, CBT thought challenging, and planning ahead to inform discussion when appropriate.    Therapist Response:  Pt engaged in discussion is able to share and provide support.        Session  Time: 12:00 -1:00   Participation Level: Active   Behavioral Response: CasualAlertDepressed   Type of Therapy: Group therapy   Treatment Goals addressed: Coping   Progress Towards Goals: Progressing   Interventions: CBT, DBT, Solution Focused, Strength-based,  Supportive, and Reframing   Summary: 12:00 - 12:50: Cln introduced DBT distress tolerance skill IMPROVE.  Cln discussed how this set of skills are for when you have to sit through an undesirable feeling and wait for it to pass. Group discussed how to apply the IMPROVE skills to decrease distress at the undesired feeling.  12:50 - 1:00 Clinician assessed for immediate needs, medication compliance and efficacy, and safety concerns.   Therapist Response: 12:00 - 12:50:  Pt engaged in discussion and is able to identify ways to apply the skill.  12:50 - 1:00 pm: At check-out, patient reports no immediate concerns. Patient demonstrates progress as evidenced by continued engagement and responsiveness to treatment. Patient denies SI/HI/self-harm thoughts at the end of group.    Suicidal/Homicidal: Nowithout intent/plan  Plan: Pt will continue in PHP while working to decrease depression symptoms, eliminate SI, and increase ability to manage symptoms in a healthy manner.   Collaboration of Care: Medication Management AEB T Lewis, NP  Patient/Guardian was advised Release of Information must be obtained prior to any record release in order to collaborate their care with an outside provider. Patient/Guardian was advised if they have not already done so to contact the registration department to sign all necessary forms in order for [place] to release information regarding their care.   Consent: Patient/Guardian gives verbal consent for treatment and assignment of benefits for services provided during this visit. Patient/Guardian expressed understanding and agreed to proceed.   Diagnosis: Severe episode of recurrent major depressive disorder, without psychotic features (HCC) [F33.2]    1. Severe episode of recurrent major depressive disorder, without psychotic features (HCC)   2. PTSD (post-traumatic stress disorder)       Donia Guiles, LCSW

## 2023-05-03 NOTE — Psych (Signed)
 Virtual Visit via Video Note  I connected with Zachary [name] on 05/03/23 at  9:00 AM EST by a video enabled telemedicine application and verified that I am speaking with the correct person using two identifiers.  Location: Patient: Home Provider: Clinical Home Office   I discussed the limitations of evaluation and management by telemedicine and the availability of in person appointments. The patient expressed understanding and agreed to proceed.  Follow Up Instructions:    I discussed the assessment and treatment plan with the patient. The patient was provided an opportunity to ask questions and all were answered. The patient agreed with the plan and demonstrated an understanding of the instructions.   The patient was advised to call back or seek an in-person evaluation if the symptoms worsen or if the condition fails to improve as anticipated.  I provided 240 minutes of non-face-to-face time during this encounter.   Benton JINNY Devoid, [facility]   [facility] BH PHP THERAPIST PROGRESS NOTE  Zachary [name] [id number]  Session Time: 9:00-10:00  Participation Level: Active  Behavioral Response: CasualLethargicDepressed  Type of Therapy: Group Therapy  Treatment Goals addressed: Coping  Progress Towards Goals: Progressing  Interventions: CBT, DBT, Solution Focused, Strength-based, Supportive, and Reframing  Summary: Zachary [name] is a 52 y.o. male who presents with depression and PTSD symptoms. Clinician led check-in regarding current stressors and situation, and review of patient completed daily inventory. Clinician utilized active listening and empathetic response and validated patient emotions. Clinician facilitated processing group on pertinent issues.?    Therapist Response: Patient arrived within time allowed. Patient rates his mood at a 5 on a scale of 1-10 with 10 being best. Pt states he feels tired but reports he slept a little better than the night before. Pt states he  slept 4-5 hours and appetite is still decreased, but better yesterday and ate a salad with chicken. Pt reports he had a good doctor's appointment with his PCP yesterday. He also spent time with his friend on his deck. Pt shares his goal for today is to clean up his house some due to a guest coming this weekend. Patient able to process. Patient engaged in discussion.  Session Time: 10:00 am - 11:00 am   Participation Level: Active   Behavioral Response: CasualAlertDepressed   Type of Therapy: Group Therapy   Treatment Goals addressed: Coping   Progress Towards Goals: Progressing   Interventions: CBT, DBT, Solution Focused, Strength-based, Supportive, and Reframing   Therapist Response: Cln led discussion on stigma and how stigma can affect the way others see pt's getting mental health treatment. Group discussed medication hesitancy and finding providers they connect with in order to have open communication related to medications. Group discussed codependency and the importance of setting and maintaining boundaries to work to become less codependent. Cln explained spoon theory and how this can help pts communicate with others what they have to offer each day or hour. Therapist Response: Pt able to process and provide support to group. Pt shares he understands that people project their own insecurities onto others so he has learned to stop worrying about what others say as much. He reports he understands their thoughts/opinions are outside of his control.         Session Time: 11:00 -12:00   Participation Level: Active   Behavioral Response: CasualAlertDepressed   Type of Therapy: Group Therapy   Treatment Goals addressed: Coping   Progress Towards Goals: Progressing   Interventions: CBT, DBT, Solution Focused, Strength-based, Supportive,  and Reframing   Summary: Clinician introduced topic of Mindfulness." Group watched TedTalk "Don't try to be Mindful" and discussed. Cln discussed  why mindfulness is important and pts identified where they can practice mindfulness in their everyday lives and set ways to practice mindfulness like doing puzzles, coloring, arts and crafts, Transport Planner, etc.    Therapist Response: Pt engaged in discussion. Pt engaged in discussion. Pt identified 2 activities they do on autopilot to start practicing mindfulness: brushing teeth and showering. They identified an unpleasant autopilot activity of cleaning up the bathroom to practice mindfulness during more stressful/unpleasant moments.       Session Time: 12:00 -1:00   Participation Level: Active   Behavioral Response: CasualAlertDepressed   Type of Therapy: Group therapy   Treatment Goals addressed: Coping   Progress Towards Goals: Progressing   Interventions: OT group   Summary: 12:00 - 12:50: Occupational Therapy group with cln E. Hollan.  12:50 - 1:00 Clinician assessed for immediate needs, medication compliance and efficacy, and safety concerns.   Therapist Response: 12:00 - 12:50: See note 12:50 - 1:00 pm: At check-out, patient reports no immediate concerns. Patient demonstrates progress as evidenced by continued engagement and responsiveness to treatment. Patient denies SI/HI/self-harm thoughts at the end of group.   Suicidal/Homicidal: Nowithout intent/plan  Plan: Pt will continue in PHP while working to decrease depression and anxiety symptoms, decreased pSI, increase emotion regulation, and increase ability to manage symptoms in a healthy manner.   Collaboration of Care: Medication Management AEB Staci Kerns  Patient/Guardian was advised Release of Information must be obtained prior to any record release in order to collaborate their care with an outside provider. Patient/Guardian was advised if they have not already done so to contact the registration department to sign all necessary forms in order for us  to release information regarding their care.   Consent:  Patient/Guardian gives verbal consent for treatment and assignment of benefits for services provided during this visit. Patient/Guardian expressed understanding and agreed to proceed.   Diagnosis: Severe episode of recurrent major depressive disorder, without psychotic features (HCC) [F33.2]    1. Severe episode of recurrent major depressive disorder, without psychotic features (HCC)   2. PTSD (post-traumatic stress disorder)       Benton JINNY Devoid, [facility]

## 2023-05-03 NOTE — Psych (Signed)
Virtual Visit via Video Note  I connected with Zachary [name] on 04/22/23 at  9:00 AM EST by a video enabled telemedicine application and verified that I am speaking with the correct person using two identifiers.  Location: Patient: patient home Provider: clinical home office   I discussed the limitations of evaluation and management by telemedicine and the availability of in person appointments. The patient expressed understanding and agreed to proceed.  I discussed the assessment and treatment plan with the patient. The patient was provided an opportunity to ask questions and all were answered. The patient agreed with the plan and demonstrated an understanding of the instructions.   The patient was advised to call back or seek an in-person evaluation if the symptoms worsen or if the condition fails to improve as anticipated.  Pt was provided 240 minutes of non-face-to-face time during this encounter.   Donia Guiles, LCSW   [facility] BH PHP THERAPIST PROGRESS NOTE  Zachary [name] [id number]  Session Time: 9:00 - 10:00  Participation Level: Active  Behavioral Response: CasualAlertDepressed  Type of Therapy: Group Therapy  Treatment Goals addressed: Coping  Progress Towards Goals: Initial  Interventions: CBT, DBT, Supportive, and Reframing  Summary: Zachary [name] is a 52 y.o. male who presents with depression symptoms.  Clinician led check-in regarding current stressors and situation, and review of patient completed daily inventory. Clinician utilized active listening and empathetic response and validated patient emotions. Clinician facilitated processing group on pertinent issues.?    Therapist Response:  Patient arrived within time allowed. Patient rates his mood at a 4 on a scale of 1-10 with 10 being best. Pt states he feels "fine." Pt states he slept 4.5 hours and ate 2x. Pt reports difficulties falling and staying asleep. Pt states he is on a weight management  medication and therefore eats little. Pt reports yesterday he had an appointment and went to his cigar lounge. Pt states the cigar lounge is one of his safe spaces and he goes multiple times a week to be out of the house and around people.  Pt identifies passive SI and denies plan/intent. Pt exhibits flat affect. Patient able to process. Patient engaged in discussion.          Session Time: 10:00 am - 11:00 am   Participation Level: Active   Behavioral Response: CasualAlertDepressed   Type of Therapy: Group Therapy   Treatment Goals addressed: Coping   Progress Towards Goals: Progressing   Interventions: CBT, DBT, Solution Focused, Strength-based, Supportive, and Reframing   Summary:  Cln led discussion on ways to manage stressors and feelings over the weekend. Group members  brainstormed things to do over the weekend for multiple levels of energy, access, and moods. Cln reviewed crisis services should they be needed and provided pt's with the text crisis line, mobile crisis, national suicide hotline, [facility] 24/7 line, and information on [facility].      Therapist Response: Pt engaged in discussion and is able to identify 3 ideas of what to do over the weekend to keep their mind engaged.           Session Time: 11:00 -12:00   Participation Level: Active   Behavioral Response: CasualAlertDepressed   Type of Therapy: Group Therapy   Treatment Goals addressed: Coping   Progress Towards Goals: Progressing   Interventions: CBT, DBT, Solution Focused, Strength-based, Supportive, and Reframing   Summary: Cln introduced grounding technique of [id number] and led practice with the group. Cln discussed the ways  in which grounding can be helpful with different feelings and how to utilize the skill.    Therapist Response:  Pt engaged in practice and reports understanding of skill.        Session Time: 12:00 -1:00   Participation Level: Active   Behavioral Response:  CasualAlertDepressed   Type of Therapy: Group therapy   Treatment Goals addressed: Coping   Progress Towards Goals: Progressing   Interventions: OT group   Summary: 12:00 - 12:50: Occupational Therapy group with cln E. Hollan.  12:50 - 1:00 Clinician assessed for immediate needs, medication compliance and efficacy, and safety concerns.   Therapist Response: 12:00 - 12:50: See note 12:50 - 1:00 pm: At check-out, patient reports no immediate concerns. Patient demonstrates progress as evidenced by participation in first group session. Patient denies SI/HI/self-harm thoughts at the end of group.    Suicidal/Homicidal: Yeswithout intent/plan  Plan: Pt will continue in PHP while working to decrease depression symptoms, eliminate SI, and increase ability to manage symptoms in a healthy manner.   Collaboration of Care: Medication Management AEB T Lewis, NP  Patient/Guardian was advised Release of Information must be obtained prior to any record release in order to collaborate their care with an outside provider. Patient/Guardian was advised if they have not already done so to contact the registration department to sign all necessary forms in order for [place] to release information regarding their care.   Consent: Patient/Guardian gives verbal consent for treatment and assignment of benefits for services provided during this visit. Patient/Guardian expressed understanding and agreed to proceed.   Diagnosis: Severe episode of recurrent major depressive disorder, without psychotic features (HCC) [F33.2]    1. Severe episode of recurrent major depressive disorder, without psychotic features (HCC)   2. PTSD (post-traumatic stress disorder)       Donia Guiles, LCSW

## 2023-05-03 NOTE — Progress Notes (Signed)
 Spoke with patient via Teams video call, used 2 identifiers to correctly identify patient. States that he was referred to [facility] after feeling suicidal and talking to a Oncologist about it when a neighbor called them for a safety check on him. States he has been depressed for about 10 years and has attempted suicide a couple times with the last time in 1991. He had a heart attack on Labor Day with a stent placed. He has not been sleeping well even with Trazodone . Woke at 12am and then again at 4am. His main stressors are lack of purpose, financial, and being lonely since his divorce 10 years ago. On scale 1-10 as 10 being worst he rates depression at 5 and anxiety at 2. Denies SI/HI or AV hallucinations. Does not feel group is beneficial to him since the dynamics of the group is a younger crowd. States he is willing to see the process through and hopes to gain some coping skills. No issues or complaints. No side effects from medications.

## 2023-05-04 ENCOUNTER — Other Ambulatory Visit (HOSPITAL_COMMUNITY): Payer: BC Managed Care – PPO

## 2023-05-05 ENCOUNTER — Other Ambulatory Visit (HOSPITAL_COMMUNITY): Payer: Self-pay | Admitting: Family

## 2023-05-05 ENCOUNTER — Other Ambulatory Visit (HOSPITAL_COMMUNITY): Payer: BC Managed Care – PPO | Attending: Psychiatry | Admitting: Licensed Clinical Social Worker

## 2023-05-05 ENCOUNTER — Other Ambulatory Visit (HOSPITAL_COMMUNITY): Payer: BC Managed Care – PPO

## 2023-05-05 DIAGNOSIS — G4700 Insomnia, unspecified: Secondary | ICD-10-CM | POA: Insufficient documentation

## 2023-05-05 DIAGNOSIS — F4310 Post-traumatic stress disorder, unspecified: Secondary | ICD-10-CM

## 2023-05-05 DIAGNOSIS — F332 Major depressive disorder, recurrent severe without psychotic features: Secondary | ICD-10-CM | POA: Insufficient documentation

## 2023-05-05 DIAGNOSIS — Z79899 Other long term (current) drug therapy: Secondary | ICD-10-CM | POA: Insufficient documentation

## 2023-05-05 DIAGNOSIS — R4589 Other symptoms and signs involving emotional state: Secondary | ICD-10-CM

## 2023-05-05 MED ORDER — QUETIAPINE FUMARATE 25 MG PO TABS: 25.0000 mg | ORAL_TABLET | Freq: Every day | ORAL | 0 refills | Status: DC

## 2023-05-05 NOTE — Progress Notes (Signed)
 Spoke to patient regarding discontinuing Trazodone 50 mg to 100 mg and Patient will start Seroquel 25 mg-50 mg nightly PRN. Patient as discontinued Abilify 5 mgs. This provider to f/u

## 2023-05-06 ENCOUNTER — Encounter (HOSPITAL_COMMUNITY): Payer: Self-pay | Admitting: Family

## 2023-05-06 ENCOUNTER — Other Ambulatory Visit (HOSPITAL_COMMUNITY): Payer: BC Managed Care – PPO | Admitting: Licensed Clinical Social Worker

## 2023-05-06 ENCOUNTER — Other Ambulatory Visit (HOSPITAL_COMMUNITY): Payer: BC Managed Care – PPO

## 2023-05-06 DIAGNOSIS — F332 Major depressive disorder, recurrent severe without psychotic features: Secondary | ICD-10-CM | POA: Diagnosis not present

## 2023-05-06 DIAGNOSIS — G479 Sleep disorder, unspecified: Secondary | ICD-10-CM

## 2023-05-06 DIAGNOSIS — R4589 Other symptoms and signs involving emotional state: Secondary | ICD-10-CM

## 2023-05-06 DIAGNOSIS — F4310 Post-traumatic stress disorder, unspecified: Secondary | ICD-10-CM

## 2023-05-06 NOTE — Psych (Signed)
 Virtual Visit via Video Note  I connected with Zachary [name] on 05/05/2023 at  9:00 AM EST by a video enabled telemedicine application and verified that I am speaking with the correct person using two identifiers.  Location: Patient: patient home Provider: clinical home office   I discussed the limitations of evaluation and management by telemedicine and the availability of in person appointments. The patient expressed understanding and agreed to proceed.  I discussed the assessment and treatment plan with the patient. The patient was provided an opportunity to ask questions and all were answered. The patient agreed with the plan and demonstrated an understanding of the instructions.   The patient was advised to call back or seek an in-person evaluation if the symptoms worsen or if the condition fails to improve as anticipated.  Pt was provided 240 minutes of non-face-to-face time during this encounter.   Randall Bastos, LCSW   [facility] BH PHP THERAPIST PROGRESS NOTE  Zachary [name] [id number]  Session Time: 9:00 - 10:00  Participation Level: Active  Behavioral Response: CasualAlertDepressed  Type of Therapy: Group Therapy  Treatment Goals addressed: Coping  Progress Towards Goals: Progressing  Interventions: CBT, DBT, Supportive, and Reframing  Summary: Zachary [name] is a 53 y.o. male who presents with depression symptoms.  Clinician led check-in regarding current stressors and situation, and review of patient completed daily inventory. Clinician utilized active listening and empathetic response and validated patient emotions. Clinician facilitated processing group on pertinent issues.?    Therapist Response:  Patient arrived within time allowed. Patient rates his mood at a 7.5 on a scale of 1-10 with 10 being best. Pt states he feels pretty good. Pt states he slept 10 hours and ate 1x. Pt reports he slept well last night and had a relaxing new years. Pt reports he  went to the cigar lounge, talked to friends on the phone, and cleaned around the house. Pt has slightly brighter affect. Patient able to process. Patient engaged in discussion.          Session Time: 10:00 am - 11:00 am   Participation Level: Active   Behavioral Response: CasualAlertDepressed   Type of Therapy: Group Therapy   Treatment Goals addressed: Coping   Progress Towards Goals: Progressing   Interventions: CBT, DBT, Solution Focused, Strength-based, Supportive, and Reframing   Summary: Cln led processing group for pt's current struggles. Group members shared stressors and provided support and feedback. Cln brought in topics of boundaries, healthy relationships, and unhealthy thought processes to inform discussion.    Therapist Response: Pt able to process and provide support to group.            Session Time: 11:00 -12:00   Participation Level: Active   Behavioral Response: CasualAlertDepressed   Type of Therapy: Group Therapy   Treatment Goals addressed: Coping   Progress Towards Goals: Progressing   Interventions: CBT, DBT, Solution Focused, Strength-based, Supportive, and Reframing   Summary: Cln led discussion on anger and the way it impacts us . Cln encouraged pt's to view anger as a warning from our bodies that something is not right. Cln utilized the anger iceberg to discuss the ways in which anger may be masking other feelings that are more difficult to express. Group discussed how they experience anger and what issues it has caused.    Therapist Response:  Pt engaged in discussion and reports understanding.        Session Time: 12:00 -1:00   Participation Level: Active   Behavioral  Response: CasualAlertDepressed   Type of Therapy: Group therapy   Treatment Goals addressed: Coping   Progress Towards Goals: Progressing   Interventions: OT group   Summary: 12:00 - 12:50: Occupational Therapy group with cln E. Hollan.  12:50 - 1:00 Clinician  assessed for immediate needs, medication compliance and efficacy, and safety concerns.   Therapist Response: 12:00 - 12:50: See note 12:50 - 1:00 pm: At check-out, patient reports no immediate concerns. Patient demonstrates progress as evidenced by continued engagement and responsiveness to treatment. Patient denies SI/HI/self-harm thoughts at the end of group.    Suicidal/Homicidal: Nowithout intent/plan  Plan: Pt will continue in PHP while working to decrease depression symptoms, eliminate SI, and increase ability to manage symptoms in a healthy manner.   Collaboration of Care: Medication Management AEB T Lewis, NP  Patient/Guardian was advised Release of Information must be obtained prior to any record release in order to collaborate their care with an outside provider. Patient/Guardian was advised if they have not already done so to contact the registration department to sign all necessary forms in order for us  to release information regarding their care.   Consent: Patient/Guardian gives verbal consent for treatment and assignment of benefits for services provided during this visit. Patient/Guardian expressed understanding and agreed to proceed.   Diagnosis: Severe episode of recurrent major depressive disorder, without psychotic features (HCC) [F33.2]    1. Severe episode of recurrent major depressive disorder, without psychotic features (HCC)   2. PTSD (post-traumatic stress disorder)       Randall Bastos, LCSW

## 2023-05-06 NOTE — Progress Notes (Signed)
 Zachary [name] reports concerns related to sleeping medications.  Attempted to titrate trazodone  25 mg to 50 mg to 100 mg p.o. nightly as needed however,  patient is unable to tolerate medications and states he is not resting well with trazodone .  Initiated Seroquel  25 to 50 mg nightly however reported he has not picked up medications at this time.  Did have questions related to medications for an antipsychotic.  Additional education was provided.  States he will try medication over the weekend.  Support encouragement reassurance was provided.

## 2023-05-09 ENCOUNTER — Encounter (HOSPITAL_COMMUNITY): Payer: Self-pay

## 2023-05-09 ENCOUNTER — Other Ambulatory Visit (HOSPITAL_COMMUNITY): Payer: BC Managed Care – PPO

## 2023-05-09 ENCOUNTER — Other Ambulatory Visit (HOSPITAL_COMMUNITY): Payer: BC Managed Care – PPO | Admitting: Licensed Clinical Social Worker

## 2023-05-09 DIAGNOSIS — G4700 Insomnia, unspecified: Secondary | ICD-10-CM | POA: Diagnosis not present

## 2023-05-09 DIAGNOSIS — F332 Major depressive disorder, recurrent severe without psychotic features: Secondary | ICD-10-CM

## 2023-05-09 DIAGNOSIS — Z79899 Other long term (current) drug therapy: Secondary | ICD-10-CM | POA: Diagnosis not present

## 2023-05-09 DIAGNOSIS — R4589 Other symptoms and signs involving emotional state: Secondary | ICD-10-CM

## 2023-05-09 NOTE — Psych (Signed)
 Virtual Visit via Video Note  I connected with Arik S Lascano on 05/06/2023 at  9:00 AM EST by a video enabled telemedicine application and verified that I am speaking with the correct person using two identifiers.  Location: Patient: patient home Provider: clinical home office   I discussed the limitations of evaluation and management by telemedicine and the availability of in person appointments. The patient expressed understanding and agreed to proceed.  I discussed the assessment and treatment plan with the patient. The patient was provided an opportunity to ask questions and all were answered. The patient agreed with the plan and demonstrated an understanding of the instructions.   The patient was advised to call back or seek an in-person evaluation if the symptoms worsen or if the condition fails to improve as anticipated.  Pt was provided 240 minutes of non-face-to-face time during this encounter.   Randall Bastos, LCSW   [facility] PHP THERAPIST PROGRESS NOTE  ELIYAH BAZZI [id number]  Session Time: 9:00 - 10:00  Participation Level: Active  Behavioral Response: CasualAlertDepressed  Type of Therapy: Group Therapy  Treatment Goals addressed: Coping  Progress Towards Goals: Progressing  Interventions: CBT, DBT, Supportive, and Reframing  Summary: DELORIS MITTAG is a 53 y.o. male who presents with depression symptoms.  Clinician led check-in regarding current stressors and situation, and review of patient completed daily inventory. Clinician utilized active listening and empathetic response and validated patient emotions. Clinician facilitated processing group on pertinent issues.?    Therapist Response:  Patient arrived within time allowed. Patient rates his mood at a 7 on a scale of 1-10 with 10 being best. Pt states he feels okay. Pt states he slept 9.5 hours and ate 1x. Pt reports he he slept better and thinks it is helping his mood. Pt states he stayed home  yesterday and watched tv, played a video game, and read. Pt has slightly brighter affect. Patient able to process. Patient engaged in discussion.          Session Time: 10:00 am - 11:00 am   Participation Level: Active   Behavioral Response: CasualAlertDepressed   Type of Therapy: Group Therapy   Treatment Goals addressed: Coping   Progress Towards Goals: Progressing   Interventions: CBT, DBT, Solution Focused, Strength-based, Supportive, and Reframing   Summary: Cln led discussion on the ways our phones can cause barriers to our mental health. Group discussed struggles they have with their phones and the role it can play in unhealthy thought patterns and behaviors. Group worked to problem solve ways to cut down on negative behaviors.    Therapist Response: Pt engaged in discussion and was able to process.            Session Time: 11:00 -12:00   Participation Level: Active   Behavioral Response: CasualAlertDepressed   Type of Therapy: Group Therapy   Treatment Goals addressed: Coping   Progress Towards Goals: Progressing   Interventions: CBT, DBT, Solution Focused, Strength-based, Supportive, and Reframing   Summary: Cln led discussion on CBT thinking error: personalization. Cln drew connection between personalizing thoughts to increased self conciousness and lower self esteem.  Group discussed struggled with personalizing and created reframing statements.    Therapist Response: Pt engaged in discussion and reports understanding of personalization.        Session Time: 12:00 -1:00   Participation Level: Active   Behavioral Response: CasualAlertDepressed   Type of Therapy: Group therapy   Treatment Goals addressed: Coping   Progress Towards Goals:  Progressing   Interventions: OT group   Summary: 12:00 - 12:50: Occupational Therapy group with cln E. Hollan.  12:50 - 1:00 Clinician assessed for immediate needs, medication compliance and efficacy, and  safety concerns.   Therapist Response: 12:00 - 12:50: See note 12:50 - 1:00 pm: At check-out, patient reports no immediate concerns. Patient demonstrates progress as evidenced by continued engagement and responsiveness to treatment. Patient denies SI/HI/self-harm thoughts at the end of group.    Suicidal/Homicidal: Nowithout intent/plan  Plan: Pt will continue in PHP while working to decrease depression symptoms, eliminate SI, and increase ability to manage symptoms in a healthy manner.   Collaboration of Care: Medication Management AEB T Lewis, NP  Patient/Guardian was advised Release of Information must be obtained prior to any record release in order to collaborate their care with an outside provider. Patient/Guardian was advised if they have not already done so to contact the registration department to sign all necessary forms in order for us  to release information regarding their care.   Consent: Patient/Guardian gives verbal consent for treatment and assignment of benefits for services provided during this visit. Patient/Guardian expressed understanding and agreed to proceed.   Diagnosis: Severe episode of recurrent major depressive disorder, without psychotic features (HCC) [F33.2]    1. Severe episode of recurrent major depressive disorder, without psychotic features (HCC)   2. Difficulty coping   3. Sleep disturbance   4. PTSD (post-traumatic stress disorder)       Randall Bastos, LCSW

## 2023-05-09 NOTE — Therapy (Signed)
 [facility] PARTIAL HOSPITALIZATION PROGRAM [name] [place], [place], [address] Phone: [phone]   Fax:  [phone]  Occupational Therapy Treatment Virtual Visit via Video Note  I connected with Zachary [name] on 05/09/23 at  8:00 AM EST by a video enabled telemedicine application and verified that I am speaking with the correct person using two identifiers.  Location: Patient: home Provider: office   I discussed the limitations of evaluation and management by telemedicine and the availability of in person appointments. The patient expressed understanding and agreed to proceed.    The patient was advised to call back or seek an in-person evaluation if the symptoms worsen or if the condition fails to improve as anticipated.  I provided 55 minutes of non-face-to-face time during this encounter.   Patient Details  Name: Zachary [name] MRN: [id number] Date of Birth: 31-Dec-1970 No data recorded  Encounter Date: 05/05/2023   OT End of Session - 05/09/23 1138     Visit Number 3    Number of Visits 20    Date for OT Re-Evaluation 05/26/23    OT Start Time 1200    OT Stop Time 1255    OT Time Calculation (min) 55 min             Past Medical History:  Diagnosis Date   Anxiety    GERD (gastroesophageal reflux disease)    Hyperlipidemia    Hypertension    Myocardial infarction [facility])     Past Surgical History:  Procedure Laterality Date   CARDIAC CATHETERIZATION     HEMORRHOID SURGERY     HEMORRHOID SURGERY N/A 09/13/2014   Procedure: HEMORRHOIDECTOMY;  Surgeon: Unknown Sharps, MD;  Location: [facility] ORS;  Service: General;  Laterality: N/A;    There were no vitals filed for this visit.   Subjective Assessment - 05/09/23 1138     Currently in Pain? No/denies    Pain Score 0-No pain                 Group Session:  S: Feeling better today.   O: During today's OT group session, the patient participated in an  educational segment about the importance of goal-setting and the application of the SMART framework to enhance daily life, particularly focusing on ADLs and iADLs. The session began with five open-ended pre-session questions that facilitated group discussion and introspection about their current relationship with goals. Following the introduction and educational segment, participants engaged in brainstorming and group discussions to devise hypothetical SMART goals. The session concluded with five post-session questions to reinforce understanding and facilitate reflection. Throughout the session, there was a range of engagement levels noted among the participants.   A:  Patient demonstrated a high level of engagement throughout the session. They actively participated in discussions, sharing personal experiences related to goal setting and challenges faced. Patient was able to clearly articulate an understanding of the SMART framework and proposed personal SMART goals related to their own ADLs with minimal assistance. They expressed enthusiasm about applying what they learned to their daily routine and appeared motivated to make changes.     P: Continue to attend PHP OT group sessions 5x week for 4 weeks to promote daily structure, social engagement, and opportunities to develop and utilize adaptive strategies to maximize functional performance in preparation for safe transition and integration back into school, work, and the community. Plan to address topic of pt 3 in next OT group session.  OT Education - 05/09/23 1138     Education Details SMART Goals              OT Short Term Goals - 04/26/23 1638       OT SHORT TERM GOAL #1   Title Patient will be educated on strategies to improve psychosocial skills needed to participate fully in all daily, work, and leisure activities.    Time 4    Period Weeks    Status On-going    Target Date 05/26/23      OT  SHORT TERM GOAL #2   Title Pt will apply psychosocial skills and coping mechanisms to daily activities in order to function independently and reintegrate into community dwelling    Status On-going      OT SHORT TERM GOAL #3   Title Pt will choose and/or engage in 1-3 socially engaging leisure activities to improve social participation skills upon reintegrating into community    Status On-going      OT SHORT TERM GOAL #4   Title Patient will be educated on strategies to improve psychosocial skills needed to participate fully in all daily, work, and leisure activities.    Status On-going                      Plan - 05/09/23 1139     Psychosocial Skills Coping Strategies;Interpersonal Interaction;Habits             Patient will benefit from skilled therapeutic intervention in order to improve the following deficits and impairments:       Psychosocial Skills: Coping Strategies, Interpersonal Interaction, Habits   Visit Diagnosis: Difficulty coping    Problem List Patient Active Problem List   Diagnosis Date Noted   MDD (major depressive disorder), recurrent episode, severe (HCC) 04/21/2023   PTSD (post-traumatic stress disorder) 04/21/2023   CAD (coronary atherosclerotic disease) 01/14/2023   History of MI (myocardial infarction) 01/14/2023   Tobacco use disorder 01/14/2023   STEMI involving left anterior descending coronary artery (HCC) 01/03/2023   Gout 10/07/2022   Primary hypertension 06/12/2021   Bilateral leg edema 06/12/2021   Depression, major, single episode, complete remission (HCC) 06/12/2021   GERD (gastroesophageal reflux disease) 09/15/2018   Hemorrhoids 09/15/2018   Obesity 09/15/2018    [place] KANDICE Purpura, OT 05/09/2023, 11:39 AM Dallas Purpura, OT   [facility] HOSPITALIZATION PROGRAM [address] [place], [place], [address] Phone: [phone]   Fax:  [phone]  Name: Zachary [name] MRN:  [id number] Date of Birth: [birth year]

## 2023-05-09 NOTE — Therapy (Signed)
 [facility] PARTIAL HOSPITALIZATION PROGRAM [address] [place], [place], [address] Phone: [phone]   Fax:  [phone]  Occupational Therapy Treatment Virtual Visit via Video Note  I connected with Zachary [name] on 05/09/23 at  8:00 AM EST by a video enabled telemedicine application and verified that I am speaking with the correct person using two identifiers.  Location: Patient: home Provider: office   I discussed the limitations of evaluation and management by telemedicine and the availability of in person appointments. The patient expressed understanding and agreed to proceed.    The patient was advised to call back or seek an in-person evaluation if the symptoms worsen or if the condition fails to improve as anticipated.  I provided 55 minutes of non-face-to-face time during this encounter.   Patient Details  Name: Zachary [name] MRN: [id number] Date of Birth: Apr 19, 1971 No data recorded  Encounter Date: 05/03/2023   OT End of Session - 05/09/23 1149     Visit Number 5    Number of Visits 20    Date for OT Re-Evaluation 05/26/23             Past Medical History:  Diagnosis Date   Anxiety    GERD (gastroesophageal reflux disease)    Hyperlipidemia    Hypertension    Myocardial infarction [facility])     Past Surgical History:  Procedure Laterality Date   CARDIAC CATHETERIZATION     HEMORRHOID SURGERY     HEMORRHOID SURGERY N/A 09/13/2014   Procedure: HEMORRHOIDECTOMY;  Surgeon: Unknown Sharps, MD;  Location: [facility] ORS;  Service: General;  Laterality: N/A;    There were no vitals filed for this visit.   Subjective Assessment - 05/09/23 1149     Currently in Pain? No/denies    Pain Score 0-No pain                Group Session:  S: Doing better today.   O: During today's OT group session, the patient participated in an educational segment about the importance of goal-setting and the application of the SMART  framework to enhance daily life, particularly focusing on ADLs and iADLs. The session began with five open-ended pre-session questions that facilitated group discussion and introspection about their current relationship with goals. Following the introduction and educational segment, participants engaged in brainstorming and group discussions to devise hypothetical SMART goals. The session concluded with five post-session questions to reinforce understanding and facilitate reflection. Throughout the session, there was a range of engagement levels noted among the participants.   A:  Patient demonstrated a high level of engagement throughout the session. They actively participated in discussions, sharing personal experiences related to goal setting and challenges faced. Patient was able to clearly articulate an understanding of the SMART framework and proposed personal SMART goals related to their own ADLs with minimal assistance. They expressed enthusiasm about applying what they learned to their daily routine and appeared motivated to make changes.    P: Continue to attend PHP OT group sessions 5x week for 4 weeks to promote daily structure, social engagement, and opportunities to develop and utilize adaptive strategies to maximize functional performance in preparation for safe transition and integration back into school, work, and the community. Plan to address topic of tbd in next OT group session.                   OT Education - 05/09/23 1149     Education Details SMART Goals  OT Short Term Goals - 04/26/23 1638       OT SHORT TERM GOAL #1   Title Patient will be educated on strategies to improve psychosocial skills needed to participate fully in all daily, work, and leisure activities.    Time 4    Period Weeks    Status On-going    Target Date 05/26/23      OT SHORT TERM GOAL #2   Title Pt will apply psychosocial skills and coping mechanisms to daily  activities in order to function independently and reintegrate into community dwelling    Status On-going      OT SHORT TERM GOAL #3   Title Pt will choose and/or engage in 1-3 socially engaging leisure activities to improve social participation skills upon reintegrating into community    Status On-going      OT SHORT TERM GOAL #4   Title Patient will be educated on strategies to improve psychosocial skills needed to participate fully in all daily, work, and leisure activities.    Status On-going                      Plan - 05/09/23 1149     Psychosocial Skills Coping Strategies;Interpersonal Interaction;Habits             Patient will benefit from skilled therapeutic intervention in order to improve the following deficits and impairments:       Psychosocial Skills: Coping Strategies, Interpersonal Interaction, Habits   Visit Diagnosis: Difficulty coping    Problem List Patient Active Problem List   Diagnosis Date Noted   MDD (major depressive disorder), recurrent episode, severe (HCC) 04/21/2023   PTSD (post-traumatic stress disorder) 04/21/2023   CAD (coronary atherosclerotic disease) 01/14/2023   History of MI (myocardial infarction) 01/14/2023   Tobacco use disorder 01/14/2023   STEMI involving left anterior descending coronary artery (HCC) 01/03/2023   Gout 10/07/2022   Primary hypertension 06/12/2021   Bilateral leg edema 06/12/2021   Depression, major, single episode, complete remission (HCC) 06/12/2021   GERD (gastroesophageal reflux disease) 09/15/2018   Hemorrhoids 09/15/2018   Obesity 09/15/2018    [place] KANDICE Purpura, OT 05/09/2023, 11:49 AM  D[place] Purpura, OT   [facility] HOSPITALIZATION PROGRAM [address] [place], [place], [address] Phone: [phone]   Fax:  [phone]  Name: Zachary [name] MRN: [id number] Date of Birth: [birth year]

## 2023-05-09 NOTE — Progress Notes (Signed)
 BH MD/PA/NP OP Progress Note  05/09/2023 12:01 PM Zachary [name]  MRN:  [id number]  Chief Complaint: Depression  HPI: Zachary [name] seen and evaluated via video assessment.  He presents with a flat and blunted affect.  Stated he just started taking quetiapine  last night so did not notice any major somatic complaints. Reports depression is average. Has been on lexapro  and feels it has been fair in management of depressive symptoms.   Zachary [name] is alert/oriented x 4; calm/cooperative; and presented with a flat and blunted  affect. Patient is speaking in a clear tone at moderate volume, and normal pace; with good eye contact.  His thought process is coherent and relevant; There is no indication that he is currently responding to internal/external stimuli or experiencing delusional thought content.  Patient denies suicidal/self-harm/homicidal ideation, psychosis, and paranoia.  Patient has remained calm throughout assessment and has answered questions appropriately.   Visit Diagnosis:    ICD-10-CM   1. Severe episode of recurrent major depressive disorder, without psychotic features (HCC)  F33.2        Past Psychiatric History: see chart  Past Medical History:  Past Medical History:  Diagnosis Date   Anxiety    GERD (gastroesophageal reflux disease)    Hyperlipidemia    Hypertension    Myocardial infarction [facility])     Past Surgical History:  Procedure Laterality Date   CARDIAC CATHETERIZATION     HEMORRHOID SURGERY     HEMORRHOID SURGERY N/A 09/13/2014   Procedure: HEMORRHOIDECTOMY;  Surgeon: Unknown Sharps, MD;  Location: [facility] ORS;  Service: General;  Laterality: N/A;    Family Psychiatric History:   Family History:  Family History  Problem Relation Age of Onset   Depression Mother    Alcohol abuse Mother    Alcohol abuse Maternal Grandfather    Heart disease Maternal Grandfather 38   Alcohol abuse Paternal Grandfather    Bipolar disorder Cousin     Social History:   Social History   Socioeconomic History   Marital status: Divorced    Spouse name: Not on file   Number of children: 0   Years of education: Not on file   Highest education level: Some college, no degree  Occupational History   Not on file  Tobacco Use   Smoking status: Every Day    Types: Cigars   Smokeless tobacco: Never   Tobacco comments:    5cigars weekly  Vaping Use   Vaping status: Never Used  Substance and Sexual Activity   Alcohol use: Yes    Comment: occassionally   Drug use: No   Sexual activity: Yes  Other Topics Concern   Not on file  Social History Narrative   Not on file   Social Drivers of Health   Financial Resource Strain: Not on file  Food Insecurity: Low Risk  (01/03/2023)   Received from [facility]   Hunger Vital Sign    Worried About Running Out of Food in the Last Year: Never true    Ran Out of Food in the Last Year: Never true  Transportation Needs: No Transportation Needs (01/03/2023)   Received from [facility]    In the past 12 months, has lack of reliable transportation kept you from medical appointments, meetings, work or from getting things needed for daily living? : No  Physical Activity: Not on file  Stress: Not on file  Social Connections: Not on file    Allergies: No Known Allergies  Metabolic  Disorder Labs: No results found for: HGBA1C, MPG No results found for: PROLACTIN Lab Results  Component Value Date   CHOL 216 (A) 03/19/2021   TRIG 192 (A) 03/19/2021   HDL 45 03/19/2021   CHOLHDL 4.9 CALC 05/23/2006   VLDL 53 (H) 05/23/2006   LDLCALC 137 03/19/2021   Lab Results  Component Value Date   TSH 0.81 03/19/2021    Therapeutic Level Labs: No results found for: LITHIUM No results found for: VALPROATE No results found for: CBMZ  Current Medications: Current Outpatient Medications  Medication Sig Dispense Refill   allopurinol  (ZYLOPRIM ) 100 MG tablet Take 1 tablet (100 mg total) by  mouth daily. 90 tablet 1   aspirin  81 MG chewable tablet Chew by mouth.     atorvastatin  (LIPITOR) 80 MG tablet Take 1 tablet by mouth at bedtime.     escitalopram  (LEXAPRO ) 20 MG tablet Take 20 mg by mouth daily.     esomeprazole  (NEXIUM ) 20 MG capsule Take 1 capsule (20 mg total) by mouth 2 (two) times daily. 180 capsule 0   furosemide  (LASIX ) 20 MG tablet Take 1 tablet by mouth daily.     hydrochlorothiazide  (HYDRODIURIL ) 12.5 MG tablet Take 12.5 mg by mouth daily.     isosorbide  mononitrate (IMDUR ) 30 MG 24 hr tablet Take 1 tablet by mouth daily.     losartan  (COZAAR ) 25 MG tablet Take 0.5 tablets by mouth daily. (Patient not taking: Reported on 05/03/2023)     Melatonin 5 MG CAPS Take 1 capsule by mouth at bedtime.     QUEtiapine  (SEROQUEL ) 25 MG tablet Take 1 tablet (25 mg total) by mouth at bedtime. May take 2 tablets ( 50 mg total) by mouth PRN 30 tablet 0   ticagrelor  (BRILINTA ) 90 MG TABS tablet Take by mouth.     tirzepatide  (ZEPBOUND ) 15 MG/0.5ML Pen Inject 15 mg into the skin once a week. 2 mL 1   No current facility-administered medications for this visit.     Musculoskeletal: Video-assessment   Psychiatric Specialty Exam: Review of Systems  Psychiatric/Behavioral:  Positive for decreased concentration and sleep disturbance. The patient is nervous/anxious.   All other systems reviewed and are negative.   There were no vitals taken for this visit.There is no height or weight on file to calculate BMI.  General Appearance: Casual  Eye Contact:  Good  Speech:  Clear and Coherent  Volume:  Normal  Mood:  Anxious and Depressed  Affect:  Blunt, Depressed, and Flat  Thought Process:  Coherent  Orientation:  Full (Time, Place, and Person)  Thought Content: Logical   Suicidal Thoughts:  No  Homicidal Thoughts:  No  Memory:  Immediate;   Good  Judgement:  Good  Insight:  Good  Psychomotor Activity:  Normal  Concentration:  Concentration: Good  Recall:  Good  Fund of  Knowledge: Good  Language: Good  Akathisia:  No  Handed:  Right  AIMS (if indicated): done  Assets:  Communication Skills Social Support  ADL's:  Intact  Cognition: WNL  Sleep:  Good   Screenings: GAD-7    Flowsheet Row Office Visit from 01/14/2023 in [facility] [employer]  Total GAD-7 Score 5      PHQ2-9    Flowsheet Row Counselor from 04/21/2023 in [facility] PARTIAL HOSPITALIZATION PROGRAM Video Visit from 03/23/2023 in [facility] [place] [facility] [employer] Visit from 01/14/2023 in [facility] [employer] Visit from 10/07/2022 in  [facility] Visit from 06/12/2021 in [facility] [employer]  PHQ-2 Total Score 3 0 2 1 0  PHQ-9 Total Score 19 -- 7 4 --      Flowsheet Row Counselor from 04/21/2023 in BEHAVIORAL HEALTH PARTIAL HOSPITALIZATION PROGRAM ED from 04/13/2023 in [facility]  C-SSRS RISK CATEGORY High Risk High Risk        Assessment and Plan:  Continue partial hospitalization programming -continue lexapro  20 mg daily for depression -continue quetiapine  25 mg at bedtime for insomnia  -01/05/23 LDL cholesterol 46, A1c 4.7  -01/04/23 ECG Qtc 473  Collaboration of Care: Collaboration of Care: Medication Management AEB continue quetiapine  25 mg daily  Patient/Guardian was advised Release of Information must be obtained prior to any record release in order to collaborate their care with an outside provider. Patient/Guardian was advised if they have not already done so to contact the registration department to sign all necessary forms in order for us  to release information regarding their care.   Consent: Patient/Guardian gives verbal consent for treatment and assignment of benefits for services provided during this visit. Patient/Guardian expressed understanding and agreed to proceed.     Prentice Espy, MD 05/09/2023, 12:01 PM

## 2023-05-09 NOTE — Therapy (Signed)
 [facility] PARTIAL HOSPITALIZATION PROGRAM [address] [place], [place], [address] Phone: [phone]   Fax:  [phone]  Occupational Therapy Treatment Virtual Visit via Video Note  I connected with Zachary [name] on 05/09/23 at  8:00 AM EST by a video enabled telemedicine application and verified that I am speaking with the correct person using two identifiers.  Location: Patient: home Provider: office   I discussed the limitations of evaluation and management by telemedicine and the availability of in person appointments. The patient expressed understanding and agreed to proceed.    The patient was advised to call back or seek an in-person evaluation if the symptoms worsen or if the condition fails to improve as anticipated.  I provided 55 minutes of non-face-to-face time during this encounter.   Patient Details  Name: Zachary [name] MRN: [id number] Date of Birth: 07/31/1970 No data recorded  Encounter Date: 05/06/2023   OT End of Session - 05/09/23 1141     Visit Number 4    Number of Visits 20    Date for OT Re-Evaluation 05/26/23    OT Start Time 1200    OT Stop Time 1255    OT Time Calculation (min) 55 min             Past Medical History:  Diagnosis Date   Anxiety    GERD (gastroesophageal reflux disease)    Hyperlipidemia    Hypertension    Myocardial infarction [facility])     Past Surgical History:  Procedure Laterality Date   CARDIAC CATHETERIZATION     HEMORRHOID SURGERY     HEMORRHOID SURGERY N/A 09/13/2014   Procedure: HEMORRHOIDECTOMY;  Surgeon: Unknown Sharps, MD;  Location: [facility] ORS;  Service: General;  Laterality: N/A;    There were no vitals filed for this visit.   Subjective Assessment - 05/09/23 1140     Currently in Pain? No/denies    Pain Score 0-No pain               Group Session:  S: Doing better today overall.   O: During today's OT group session, the patient participated in an  educational segment about the importance of goal-setting and the application of the SMART framework to enhance daily life, particularly focusing on ADLs and iADLs. The session began with five open-ended pre-session questions that facilitated group discussion and introspection about their current relationship with goals. Following the introduction and educational segment, participants engaged in brainstorming and group discussions to devise hypothetical SMART goals. The session concluded with five post-session questions to reinforce understanding and facilitate reflection. Throughout the session, there was a range of engagement levels noted among the participants.   A:  Patient demonstrated a high level of engagement throughout the session. They actively participated in discussions, sharing personal experiences related to goal setting and challenges faced. Patient was able to clearly articulate an understanding of the SMART framework and proposed personal SMART goals related to their own ADLs with minimal assistance. They expressed enthusiasm about applying what they learned to their daily routine and appeared motivated to make changes.   P: Continue to attend PHP OT group sessions 5x week for 4 weeks to promote daily structure, social engagement, and opportunities to develop and utilize adaptive strategies to maximize functional performance in preparation for safe transition and integration back into school, work, and the community. Plan to address topic of tbd in next OT group session.  OT Education - 05/09/23 1140     Education Details SMART Goals              OT Short Term Goals - 04/26/23 1638       OT SHORT TERM GOAL #1   Title Patient will be educated on strategies to improve psychosocial skills needed to participate fully in all daily, work, and leisure activities.    Time 4    Period Weeks    Status On-going    Target Date 05/26/23      OT SHORT  TERM GOAL #2   Title Pt will apply psychosocial skills and coping mechanisms to daily activities in order to function independently and reintegrate into community dwelling    Status On-going      OT SHORT TERM GOAL #3   Title Pt will choose and/or engage in 1-3 socially engaging leisure activities to improve social participation skills upon reintegrating into community    Status On-going      OT SHORT TERM GOAL #4   Title Patient will be educated on strategies to improve psychosocial skills needed to participate fully in all daily, work, and leisure activities.    Status On-going                      Plan - 05/09/23 1141     Psychosocial Skills Coping Strategies;Interpersonal Interaction;Habits             Patient will benefit from skilled therapeutic intervention in order to improve the following deficits and impairments:       Psychosocial Skills: Coping Strategies, Interpersonal Interaction, Habits   Visit Diagnosis: Difficulty coping    Problem List Patient Active Problem List   Diagnosis Date Noted   MDD (major depressive disorder), recurrent episode, severe (HCC) 04/21/2023   PTSD (post-traumatic stress disorder) 04/21/2023   CAD (coronary atherosclerotic disease) 01/14/2023   History of MI (myocardial infarction) 01/14/2023   Tobacco use disorder 01/14/2023   STEMI involving left anterior descending coronary artery (HCC) 01/03/2023   Gout 10/07/2022   Primary hypertension 06/12/2021   Bilateral leg edema 06/12/2021   Depression, major, single episode, complete remission (HCC) 06/12/2021   GERD (gastroesophageal reflux disease) 09/15/2018   Hemorrhoids 09/15/2018   Obesity 09/15/2018    [place] KANDICE Purpura, OT 05/09/2023, 11:41 AM  [place] Purpura, OT   [facility] HOSPITALIZATION PROGRAM [address] [place], [place], [address] Phone: [phone]   Fax:  [phone]  Name: Zachary [name] MRN:  [id number] Date of Birth: [birth year]

## 2023-05-10 ENCOUNTER — Encounter (HOSPITAL_COMMUNITY): Payer: Self-pay

## 2023-05-10 ENCOUNTER — Other Ambulatory Visit (HOSPITAL_COMMUNITY): Payer: BC Managed Care – PPO | Admitting: Licensed Clinical Social Worker

## 2023-05-10 ENCOUNTER — Other Ambulatory Visit (HOSPITAL_COMMUNITY): Payer: BC Managed Care – PPO

## 2023-05-10 DIAGNOSIS — F332 Major depressive disorder, recurrent severe without psychotic features: Secondary | ICD-10-CM | POA: Diagnosis not present

## 2023-05-10 DIAGNOSIS — Z79899 Other long term (current) drug therapy: Secondary | ICD-10-CM | POA: Diagnosis not present

## 2023-05-10 DIAGNOSIS — F4310 Post-traumatic stress disorder, unspecified: Secondary | ICD-10-CM

## 2023-05-10 DIAGNOSIS — R4589 Other symptoms and signs involving emotional state: Secondary | ICD-10-CM

## 2023-05-10 DIAGNOSIS — G4700 Insomnia, unspecified: Secondary | ICD-10-CM | POA: Diagnosis not present

## 2023-05-11 ENCOUNTER — Other Ambulatory Visit (HOSPITAL_COMMUNITY): Payer: BC Managed Care – PPO

## 2023-05-11 ENCOUNTER — Telehealth (HOSPITAL_COMMUNITY): Payer: Self-pay | Admitting: Licensed Clinical Social Worker

## 2023-05-11 NOTE — Telephone Encounter (Signed)
 Encounter opened in error.

## 2023-05-12 ENCOUNTER — Telehealth (HOSPITAL_COMMUNITY): Payer: Self-pay | Admitting: Licensed Clinical Social Worker

## 2023-05-12 ENCOUNTER — Other Ambulatory Visit (HOSPITAL_COMMUNITY): Payer: BC Managed Care – PPO | Admitting: Licensed Clinical Social Worker

## 2023-05-12 ENCOUNTER — Other Ambulatory Visit (HOSPITAL_COMMUNITY): Payer: BC Managed Care – PPO

## 2023-05-12 DIAGNOSIS — G4700 Insomnia, unspecified: Secondary | ICD-10-CM | POA: Diagnosis not present

## 2023-05-12 DIAGNOSIS — G479 Sleep disorder, unspecified: Secondary | ICD-10-CM

## 2023-05-12 DIAGNOSIS — R4589 Other symptoms and signs involving emotional state: Secondary | ICD-10-CM

## 2023-05-12 DIAGNOSIS — Z79899 Other long term (current) drug therapy: Secondary | ICD-10-CM | POA: Diagnosis not present

## 2023-05-12 DIAGNOSIS — F332 Major depressive disorder, recurrent severe without psychotic features: Secondary | ICD-10-CM
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
## 2023-05-12 NOTE — Telephone Encounter (Signed)
 Cln had the following email exchange with pt:  05/10/2023 12:57 pm- Will: Zachary [name]. Below are some in-network providers for therapy and medication management. Please don't hesitate to reach out if you need anything else. Cln provided contact info for [facility], [facility], [facility], and [facility].  05/10/2023 8:07 pm- [name]: I do not like any of those options. I have been to the [facility] and they rip people off. I need a place that has both medication and therapy.  05/11/2023 10:28 am, bcc'd Zachary [name] and Whitney Cobia: Good morning. I'm sorry you had that experience at [facility]. I understand wanting to stay in the same place for both therapy and medication. The issue with that is therapy availability. Agencies that provide both services typically get filled up quickly, and because therapy appointments are longer than medication appointments, it's harder to get people in quickly. All of the local agencies I know of that provide both are booked out for therapy for at least 1-2 months, and then they can only see people about once a month unless there are last-minute cancellations or no-shows. This includes our offices.   We recommend that our patients who are discharging from [facility] and IOP connect with a therapist that can see them weekly or every other week to ensure they are being supported. Therapists that are in private practice or who are part of smaller agencies made up of only therapists tend to have this availability.    I tried to find some more options and have reached out to several more offices in [place]. I was able to speak to two- an [facility] office in [place] and [employer] [facility] for [place]- and both of them are booked out. I asked the person I spoke to at [facility] if he knows of any that have good availability for both services, and he doesn't. I'm waiting to hear back from three more offices about their  availability. They are [facility], [facility], and [facility].   I'm happy to send you some more options for each service. There may be some outside of [place], such as in [place]. Would that be too far for you?  05/11/2023 3:24 pm- Zachary [name]: That was my main hope about this program was finding g a therapist but if your just googling therapists near me then that's not really helping me. I thought there were therapists your program worked with? If I have to find my own then this is just wasting my time.  05/11/2023 4:33 pm, bcc'd Zachary and Whitney- Audrie Kuri: I apologize if there has been a misunderstanding. We do have relationships with local therapists and use those first, however when seeking to honor your preferences, sometimes we have to look outside of our normal referral sources. As I mentioned, I have sought to make connections with other offices for you to aid in making sure you get what you want and smooth the path forward for you. Ultimately, it is your call is you choose to seek out your own services, however, if you let me know what areas you are open to, there are still some options available.   05/11/2023 9:58 pm- [name]: What is your criteria and how am I requesting something different?  Due to pt's increased irritability, cln discussed with PHP group leader Zachary [name] before responding. Zachary spoke to pt on 1/9 regarding his request. Per Zachary, pt requested he be scheduled for individual therapy and  medication management at [facility], despite this being against the PHP team's recommendation due to limited availability for therapy. Cln emailed pt his appointment dates and times once scheduled: 06/13/2023 for medication and 07/04/2023 for therapy.

## 2023-05-13 ENCOUNTER — Other Ambulatory Visit (HOSPITAL_COMMUNITY): Payer: BC Managed Care – PPO

## 2023-05-16 ENCOUNTER — Other Ambulatory Visit (HOSPITAL_COMMUNITY): Payer: BC Managed Care – PPO

## 2023-05-17 ENCOUNTER — Other Ambulatory Visit (HOSPITAL_COMMUNITY): Payer: BC Managed Care – PPO

## 2023-05-18 ENCOUNTER — Encounter (HOSPITAL_COMMUNITY): Payer: Self-pay

## 2023-05-18 ENCOUNTER — Other Ambulatory Visit (HOSPITAL_COMMUNITY): Payer: BC Managed Care – PPO

## 2023-05-18 NOTE — Therapy (Signed)
 [facility] PARTIAL HOSPITALIZATION PROGRAM [address] [place], [place], [address] Phone: [phone]   Fax:  [phone]  Occupational Therapy Treatment Virtual Visit via Video Note  I connected with Zachary [name] on 05/18/23 at  8:00 AM EST by a video enabled telemedicine application and verified that I am speaking with the correct person using two identifiers.  Location: Patient: home Provider: office   I discussed the limitations of evaluation and management by telemedicine and the availability of in person appointments. The patient expressed understanding and agreed to proceed.    The patient was advised to call back or seek an in-person evaluation if the symptoms worsen or if the condition fails to improve as anticipated.  I provided 55 minutes of non-face-to-face time during this encounter.   Patient Details  Name: Zachary [name] MRN: [id number] Date of Birth: [birth year] No data recorded  Encounter Date: 05/12/2023   OT End of Session - 05/18/23 0924     Visit Number 8    Number of Visits 20    Date for OT Re-Evaluation 05/26/23    OT Start Time 1200    OT Stop Time 1255    OT Time Calculation (min) 55 min    Activity Tolerance Patient tolerated treatment well             Past Medical History:  Diagnosis Date   Anxiety    GERD (gastroesophageal reflux disease)    Hyperlipidemia    Hypertension    Myocardial infarction [facility])     Past Surgical History:  Procedure Laterality Date   CARDIAC CATHETERIZATION     HEMORRHOID SURGERY     HEMORRHOID SURGERY N/A 09/13/2014   Procedure: HEMORRHOIDECTOMY;  Surgeon: Unknown Sharps, MD;  Location: [facility] ORS;  Service: General;  Laterality: N/A;    There were no vitals filed for this visit.   Subjective Assessment - 05/18/23 0923     Currently in Pain? No/denies    Pain Score 0-No pain                 Group Session:  S: Doing okay today.   O: The objective of this  presentation is to provide a comprehensive understanding of the concept of motivation and its role in human behavior and well-being. The content covers various theories of motivation, including intrinsic and extrinsic motivators, and explores the psychological mechanisms that drive individuals to achieve goals, overcome obstacles, and make decisions. By diving into real-world applications, the presentation aims to offer actionable strategies for enhancing motivation in different life domains, such as work, relationships, and personal growth. Utilizing a multi-disciplinary approach, this presentation integrates insights from psychology, neuroscience, and behavioral economics to present a holistic view of motivation. The objective is not only to educate the audience about the complexities and driving forces behind motivation but also to equip them with practical tools and techniques to improve their own motivation levels. By the end of the presentation, attendees should have a well-rounded understanding of what motivates human actions and how to harness this knowledge for personal and professional betterment.   A: The patient demonstrates a high level of engagement during the session, actively participating in discussions about motivation theories and their applicability to their own life. They show keen interest in learning new strategies to improve their motivation and even offer examples from their own experiences that align with the theories presented. Their level of self-awareness and willingness to invest in self-improvement suggest that they are  well-positioned to benefit from the practical tools and techniques discussed. The patient's ability to articulate their goals and challenges further supports the likelihood of successfully implementing the strategies presented.  OCCUPATIONAL THERAPY DISCHARGE SUMMARY  Visits from Start of Care: 8  Current functional level related to goals / functional  outcomes: Pt has met all OT goals and is ready for DC at this time.    Remaining deficits: There are no remaining OT deficits at this time.     Plan: Patient agrees to discharge.       Laporsha Grealish, OT                 OT Education - 05/18/23 [phone]     Education Details Motivation 3              OT Short Term Goals - 04/26/23 1638       OT SHORT TERM GOAL #1   Title Patient will be educated on strategies to improve psychosocial skills needed to participate fully in all daily, work, and leisure activities.    Time 4    Period Weeks    Status On-going    Target Date 05/26/23      OT SHORT TERM GOAL #2   Title Pt will apply psychosocial skills and coping mechanisms to daily activities in order to function independently and reintegrate into community dwelling    Status On-going      OT SHORT TERM GOAL #3   Title Pt will choose and/or engage in 1-3 socially engaging leisure activities to improve social participation skills upon reintegrating into community    Status On-going      OT SHORT TERM GOAL #4   Title Patient will be educated on strategies to improve psychosocial skills needed to participate fully in all daily, work, and leisure activities.    Status On-going                      Plan - 05/18/23 9075     Occupational performance deficits (Please refer to evaluation for details): Rest and Sleep;Leisure;Social Participation    Psychosocial Skills Coping Strategies;Interpersonal Interaction;Habits             Patient will benefit from skilled therapeutic intervention in order to improve the following deficits and impairments:       Psychosocial Skills: Coping Strategies, Interpersonal Interaction, Habits   Visit Diagnosis: Difficulty coping    Problem List Patient Active Problem List   Diagnosis Date Noted   MDD (major depressive disorder), recurrent episode, severe (HCC) 04/21/2023   PTSD (post-traumatic stress  disorder) 04/21/2023   CAD (coronary atherosclerotic disease) 01/14/2023   History of MI (myocardial infarction) 01/14/2023   Tobacco use disorder 01/14/2023   STEMI involving left anterior descending coronary artery (HCC) 01/03/2023   Gout 10/07/2022   Primary hypertension 06/12/2021   Bilateral leg edema 06/12/2021   Depression, major, single episode, complete remission (HCC) 06/12/2021   GERD (gastroesophageal reflux disease) 09/15/2018   Hemorrhoids 09/15/2018   Obesity 09/15/2018    [place] KANDICE Purpura, OT 05/18/2023, 9:24 AM  [place] Purpura, OT   [facility] HOSPITALIZATION PROGRAM [address] [place], [place], [address] Phone: [phone]   Fax:  [phone]  Name: Zachary [name] MRN: [id number] Date of Birth: [birth year]

## 2023-05-18 NOTE — Therapy (Signed)
 [facility] PARTIAL HOSPITALIZATION PROGRAM [address] [place], [place], [address] Phone: [phone]   Fax:  [phone]  Occupational Therapy Treatment Virtual Visit via Video Note  I connected with Zachary [name] on 05/18/23 at  8:00 AM EST by a video enabled telemedicine application and verified that I am speaking with the correct person using two identifiers.  Location: Patient: home Provider: office   I discussed the limitations of evaluation and management by telemedicine and the availability of in person appointments. The patient expressed understanding and agreed to proceed.    The patient was advised to call back or seek an in-person evaluation if the symptoms worsen or if the condition fails to improve as anticipated.  I provided 55 minutes of non-face-to-face time during this encounter.   Patient Details  Name: Zachary [name] Date of Birth: [birth year] No data recorded  Encounter Date: 05/09/2023   OT End of Session - 05/18/23 0902     Visit Number 6    Number of Visits 20    Date for OT Re-Evaluation 05/26/23    OT Start Time 1200    OT Stop Time 1255    OT Time Calculation (min) 55 min    Activity Tolerance Patient tolerated treatment well             Past Medical History:  Diagnosis Date   Anxiety    GERD (gastroesophageal reflux disease)    Hyperlipidemia    Hypertension    Myocardial infarction [facility])     Past Surgical History:  Procedure Laterality Date   CARDIAC CATHETERIZATION     HEMORRHOID SURGERY     HEMORRHOID SURGERY N/A 09/13/2014   Procedure: HEMORRHOIDECTOMY;  Surgeon: Unknown Sharps, MD;  Location: [facility] ORS;  Service: General;  Laterality: N/A;    There were no vitals filed for this visit.   Subjective Assessment - 05/18/23 0902     Currently in Pain? No/denies    Pain Score 0-No pain                     Group Session:  S: Doing better today  O: The objective  of this group is to provide a comprehensive understanding of the concept of motivation and its role in human behavior and well-being. The content covers various theories of motivation, including intrinsic and extrinsic motivators, and explores the psychological mechanisms that drive individuals to achieve goals, overcome obstacles, and make decisions. By diving into real-world applications, the presentation aims to offer actionable strategies for enhancing motivation in different life domains, such as work, relationships, and personal growth. Utilizing a multi-disciplinary approach, this presentation integrates insights from psychology, neuroscience, and behavioral economics to present a holistic view of motivation. The objective is not only to educate the audience about the complexities and driving forces behind motivation but also to equip them with practical tools and techniques to improve their own motivation levels. By the end of today's OT group, attendees should have a well-rounded understanding of what motivates human actions and how to harness this knowledge for personal and professional betterment.   A:  The patient demonstrates a high level of engagement during the session, actively participating in discussions about motivation theories and their applicability to their own life. They show keen interest in learning new strategies to improve their motivation and even offer examples from their own experiences that align with the theories presented. Their level of self-awareness and willingness to invest in  self-improvement suggest that they are well-positioned to benefit from the practical tools and techniques discussed. The patient's ability to articulate their goals and challenges further supports the likelihood of successfully implementing the strategies presented.    P: Continue to attend PHP OT group sessions 5x week for 4 weeks to promote daily structure, social engagement, and opportunities  to develop and utilize adaptive strategies to maximize functional performance in preparation for safe transition and integration back into school, work, and the community. Plan to address topic of pt 2 in next OT group session.              OT Education - 05/18/23 0902     Education Details Motivation 1              OT Short Term Goals - 04/26/23 1638       OT SHORT TERM GOAL #1   Title Patient will be educated on strategies to improve psychosocial skills needed to participate fully in all daily, work, and leisure activities.    Time 4    Period Weeks    Status On-going    Target Date 05/26/23      OT SHORT TERM GOAL #2   Title Pt will apply psychosocial skills and coping mechanisms to daily activities in order to function independently and reintegrate into community dwelling    Status On-going      OT SHORT TERM GOAL #3   Title Pt will choose and/or engage in 1-3 socially engaging leisure activities to improve social participation skills upon reintegrating into community    Status On-going      OT SHORT TERM GOAL #4   Title Patient will be educated on strategies to improve psychosocial skills needed to participate fully in all daily, work, and leisure activities.    Status On-going                      Plan - 05/18/23 0904     Psychosocial Skills Coping Strategies;Interpersonal Interaction;Habits             Patient will benefit from skilled therapeutic intervention in order to improve the following deficits and impairments:       Psychosocial Skills: Coping Strategies, Interpersonal Interaction, Habits   Visit Diagnosis: Difficulty coping    Problem List Patient Active Problem List   Diagnosis Date Noted   MDD (major depressive disorder), recurrent episode, severe (HCC) 04/21/2023   PTSD (post-traumatic stress disorder) 04/21/2023   CAD (coronary atherosclerotic disease) 01/14/2023   History of MI (myocardial infarction)  01/14/2023   Tobacco use disorder 01/14/2023   STEMI involving left anterior descending coronary artery (HCC) 01/03/2023   Gout 10/07/2022   Primary hypertension 06/12/2021   Bilateral leg edema 06/12/2021   Depression, major, single episode, complete remission (HCC) 06/12/2021   GERD (gastroesophageal reflux disease) 09/15/2018   Hemorrhoids 09/15/2018   Obesity 09/15/2018    [place] KANDICE Purpura, OT 05/18/2023, 9:05 AM Dallas Purpura, OT  [facility] HOSPITALIZATION PROGRAM [address] [place], [place], [address] Phone: [phone]   Fax:  [phone]  Name: Zachary [name] MRN: [name] Date of Birth: February 07, 1971

## 2023-05-18 NOTE — Therapy (Signed)
 [facility] PARTIAL HOSPITALIZATION PROGRAM [address] [place], [place], [address] Phone: [phone]   Fax:  [phone]  Occupational Therapy Treatment Virtual Visit via Video Note  I connected with Zachary [name] on 05/18/23 at  8:00 AM EST by a video enabled telemedicine application and verified that I am speaking with the correct person using two identifiers.  Location: Patient: home Provider: office   I discussed the limitations of evaluation and management by telemedicine and the availability of in person appointments. The patient expressed understanding and agreed to proceed.    The patient was advised to call back or seek an in-person evaluation if the symptoms worsen or if the condition fails to improve as anticipated.  I provided 55 minutes of non-face-to-face time during this encounter.   Patient Details  Name: Zachary [name] MRN: [id number] Date of Birth: 1970-11-27 No data recorded  Encounter Date: 05/10/2023   OT End of Session - 05/18/23 0920     Visit Number 7    Number of Visits 20    Date for OT Re-Evaluation 05/26/23    OT Start Time 1200    OT Stop Time 1255    OT Time Calculation (min) 55 min    Activity Tolerance Patient tolerated treatment well             Past Medical History:  Diagnosis Date   Anxiety    GERD (gastroesophageal reflux disease)    Hyperlipidemia    Hypertension    Myocardial infarction [facility])     Past Surgical History:  Procedure Laterality Date   CARDIAC CATHETERIZATION     HEMORRHOID SURGERY     HEMORRHOID SURGERY N/A 09/13/2014   Procedure: HEMORRHOIDECTOMY;  Surgeon: Unknown Sharps, MD;  Location: [facility] ORS;  Service: General;  Laterality: N/A;    There were no vitals filed for this visit.   Subjective Assessment - 05/18/23 0920     Currently in Pain? No/denies    Pain Score 0-No pain                    Group Session:  S: Doing okay today.   O: The objective of  this presentation is to provide a comprehensive understanding of the concept of motivation and its role in human behavior and well-being. The content covers various theories of motivation, including intrinsic and extrinsic motivators, and explores the psychological mechanisms that drive individuals to achieve goals, overcome obstacles, and make decisions. By diving into real-world applications, the presentation aims to offer actionable strategies for enhancing motivation in different life domains, such as work, relationships, and personal growth. Utilizing a multi-disciplinary approach, this presentation integrates insights from psychology, neuroscience, and behavioral economics to present a holistic view of motivation. The objective is not only to educate the audience about the complexities and driving forces behind motivation but also to equip them with practical tools and techniques to improve their own motivation levels. By the end of the presentation, attendees should have a well-rounded understanding of what motivates human actions and how to harness this knowledge for personal and professional betterment.   A:  The patient demonstrates a high level of engagement during the session, actively participating in discussions about motivation theories and their applicability to their own life. They show keen interest in learning new strategies to improve their motivation and even offer examples from their own experiences that align with the theories presented. Their level of self-awareness and willingness to invest in self-improvement  suggest that they are well-positioned to benefit from the practical tools and techniques discussed. The patient's ability to articulate their goals and challenges further supports the likelihood of successfully implementing the strategies presented.    P: Continue to attend PHP OT group sessions 5x week for 4 weeks to promote daily structure, social engagement, and  opportunities to develop and utilize adaptive strategies to maximize functional performance in preparation for safe transition and integration back into school, work, and the community. Plan to address topic of pt 3 in next OT group session.               OT Education - 05/18/23 0920     Education Details Motivation 2              OT Short Term Goals - 04/26/23 1638       OT SHORT TERM GOAL #1   Title Patient will be educated on strategies to improve psychosocial skills needed to participate fully in all daily, work, and leisure activities.    Time 4    Period Weeks    Status On-going    Target Date 05/26/23      OT SHORT TERM GOAL #2   Title Pt will apply psychosocial skills and coping mechanisms to daily activities in order to function independently and reintegrate into community dwelling    Status On-going      OT SHORT TERM GOAL #3   Title Pt will choose and/or engage in 1-3 socially engaging leisure activities to improve social participation skills upon reintegrating into community    Status On-going      OT SHORT TERM GOAL #4   Title Patient will be educated on strategies to improve psychosocial skills needed to participate fully in all daily, work, and leisure activities.    Status On-going                      Plan - 05/18/23 0920     Occupational performance deficits (Please refer to evaluation for details): Rest and Sleep;Leisure;Social Participation    Psychosocial Skills Coping Strategies;Interpersonal Interaction;Habits             Patient will benefit from skilled therapeutic intervention in order to improve the following deficits and impairments:       Psychosocial Skills: Coping Strategies, Interpersonal Interaction, Habits   Visit Diagnosis: Difficulty coping    Problem List Patient Active Problem List   Diagnosis Date Noted   MDD (major depressive disorder), recurrent episode, severe (HCC) 04/21/2023   PTSD  (post-traumatic stress disorder) 04/21/2023   CAD (coronary atherosclerotic disease) 01/14/2023   History of MI (myocardial infarction) 01/14/2023   Tobacco use disorder 01/14/2023   STEMI involving left anterior descending coronary artery (HCC) 01/03/2023   Gout 10/07/2022   Primary hypertension 06/12/2021   Bilateral leg edema 06/12/2021   Depression, major, single episode, complete remission (HCC) 06/12/2021   GERD (gastroesophageal reflux disease) 09/15/2018   Hemorrhoids 09/15/2018   Obesity 09/15/2018    [place] KANDICE Purpura, OT 05/18/2023, 9:21 AM  D[place] Purpura, OT   [facility] HOSPITALIZATION PROGRAM [address] [place], [place], [address] Phone: [phone]   Fax:  [phone]  Name: Zachary [name] MRN: [id number] Date of Birth: 19-Mar-1971

## 2023-05-19 ENCOUNTER — Other Ambulatory Visit (HOSPITAL_COMMUNITY): Payer: BC Managed Care – PPO

## 2023-05-20 ENCOUNTER — Other Ambulatory Visit (HOSPITAL_COMMUNITY): Payer: BC Managed Care – PPO

## 2023-05-23 ENCOUNTER — Other Ambulatory Visit (HOSPITAL_COMMUNITY): Payer: BC Managed Care – PPO

## 2023-05-25 ENCOUNTER — Telehealth: Payer: BC Managed Care – PPO | Admitting: Nurse Practitioner

## 2023-05-26 ENCOUNTER — Ambulatory Visit: Payer: BC Managed Care – PPO | Admitting: Nurse Practitioner

## 2023-05-26 ENCOUNTER — Encounter: Payer: Self-pay | Admitting: Nurse Practitioner

## 2023-05-26 VITALS — BP 121/81 | HR 75 | Temp 97.1°F | Resp 18 | Ht 68.5 in | Wt 232.6 lb

## 2023-05-26 DIAGNOSIS — Z6834 Body mass index (BMI) 34.0-34.9, adult: Secondary | ICD-10-CM

## 2023-05-26 DIAGNOSIS — R7989 Other specified abnormal findings of blood chemistry: Secondary | ICD-10-CM | POA: Diagnosis not present

## 2023-05-26 DIAGNOSIS — J069 Acute upper respiratory infection, unspecified: Secondary | ICD-10-CM

## 2023-05-26 DIAGNOSIS — E6609 Other obesity due to excess calories: Secondary | ICD-10-CM | POA: Diagnosis not present

## 2023-05-26 DIAGNOSIS — F332 Major depressive disorder, recurrent severe without psychotic features: Secondary | ICD-10-CM

## 2023-05-26 DIAGNOSIS — E66811 Obesity, class 1: Secondary | ICD-10-CM

## 2023-05-26 LAB — POC COVID19 BINAXNOW: SARS Coronavirus 2 Ag: NEGATIVE

## 2023-05-26 LAB — POCT INFLUENZA A/B
Influenza A, POC: NEGATIVE
Influenza B, POC: NEGATIVE

## 2023-05-26 MED ORDER — ZEPBOUND 15 MG/0.5ML ~~LOC~~ SOAJ: 15.0000 mg | SUBCUTANEOUS | 1 refills | Status: DC

## 2023-05-26 NOTE — Progress Notes (Signed)
Established Patient Visit  Patient: Zachary [name]   DOB: [birth year]   53 y.o. Male  MRN: [id number] Visit Date: 05/26/2023  Subjective:    Chief Complaint  Patient presents with   Medication Managment     RX refill request he is attending a 8 week program. PT also C/O of cold like symptoms for 1 day; Prevnar 20, shingles vaccine and colonoscopy is due    URI  This is a new problem. The current episode started yesterday. The problem has been unchanged. There has been no fever. Associated symptoms include congestion, coughing, headaches, joint pain, rhinorrhea, sinus pain and a sore throat. Pertinent negatives include no abdominal pain, chest pain, diarrhea, dysuria, ear pain, joint swelling, nausea, neck pain, plugged ear sensation, rash, sneezing, swollen glands, vomiting or wheezing. He has tried decongestant and acetaminophen for the symptoms. The treatment provided mild relief.   MDD (major depressive disorder), recurrent episode, severe (HCC) Under the care of psychology and psychiatry. Plans to start an inpatient behavioral health program next month  Obesity 6lbs weight loss noted with office scale (comparing 01/14/23 to 05/26/23). 03/23/23 weight was reported by patient from home scale. Use Miralax generic to help with constipation He aims for about 100mg  of protein daily and Meets with nutritionist once a month. Wt Readings from Last 3 Encounters:  05/26/23 232 lb 9.6 oz (105.5 kg)  03/23/23 222 lb (100.7 kg)  01/14/23 240 lb (108.9 kg)    Maintain zepbound dose, refill sent Encouraged to maintain a high protein and high fiber diet. Maintain daily exercise F/up in 64month in person    Reviewed medical, surgical, and social history today  Medications: Outpatient Medications Prior to Visit  Medication Sig Note   allopurinol (ZYLOPRIM) 100 MG tablet Take 1 tablet (100 mg total) by mouth daily.    aspirin 81 MG chewable tablet Chew by mouth.    atorvastatin  (LIPITOR) 80 MG tablet Take 1 tablet by mouth at bedtime.    escitalopram (LEXAPRO) 20 MG tablet Take 20 mg by mouth daily.    esomeprazole (NEXIUM) 20 MG capsule Take 1 capsule (20 mg total) by mouth 2 (two) times daily.    furosemide (LASIX) 20 MG tablet Take 1 tablet by mouth daily.    hydrochlorothiazide (HYDRODIURIL) 12.5 MG tablet Take 12.5 mg by mouth daily.    isosorbide mononitrate (IMDUR) 30 MG 24 hr tablet Take 1 tablet by mouth daily.    losartan (COZAAR) 25 MG tablet Take 0.5 tablets by mouth daily.    Melatonin 5 MG CAPS Take 1 capsule by mouth at bedtime.    QUEtiapine (SEROQUEL) 25 MG tablet Take 1 tablet (25 mg total) by mouth at bedtime. May take 2 tablets ( 50 mg total) by mouth PRN    ticagrelor (BRILINTA) 90 MG TABS tablet Take by mouth.    [DISCONTINUED] tirzepatide (ZEPBOUND) 15 MG/0.5ML Pen Inject 15 mg into the skin once a week. 05/26/2023: Refills needed    No facility-administered medications prior to visit.   Reviewed past medical and social history.   ROS per HPI above      Objective:  BP 121/81 (BP Location: Left Arm, Patient Position: Sitting, Cuff Size: Large)   Pulse 75   Temp (!) 97.1 F (36.2 C) (Temporal)   Resp 18   Ht 5' 8.5" (1.74 m)   Wt 232 lb 9.6 oz (105.5 kg)   SpO2 99%  BMI 34.85 kg/m      Physical Exam Vitals and nursing note reviewed.  HENT:     Right Ear: Tympanic membrane, ear canal and external ear normal.     Left Ear: Tympanic membrane, ear canal and external ear normal.     Nose: Congestion and rhinorrhea present.     Mouth/Throat:     Pharynx: Posterior oropharyngeal erythema and postnasal drip present. No oropharyngeal exudate.     Tonsils: No tonsillar exudate.  Eyes:     Extraocular Movements: Extraocular movements intact.     Conjunctiva/sclera: Conjunctivae normal.  Cardiovascular:     Rate and Rhythm: Normal rate and regular rhythm.     Pulses: Normal pulses.     Heart sounds: Normal heart sounds.  Pulmonary:      Effort: Pulmonary effort is normal.     Breath sounds: Normal breath sounds.  Lymphadenopathy:     Cervical: No cervical adenopathy.  Neurological:     Mental Status: He is alert and oriented to person, place, and time.     Results for orders placed or performed in visit on 05/26/23  POC COVID-19  Result Value Ref Range   SARS Coronavirus 2 Ag Negative Negative  POCT Influenza A/B  Result Value Ref Range   Influenza A, POC Negative Negative   Influenza B, POC Negative Negative      Assessment & Plan:    Problem List Items Addressed This Visit     MDD (major depressive disorder), recurrent episode, severe (HCC)   Under the care of psychology and psychiatry. Plans to start an inpatient behavioral health program next month      Obesity - Primary   6lbs weight loss noted with office scale (comparing 01/14/23 to 05/26/23). 03/23/23 weight was reported by patient from home scale. Use Miralax generic to help with constipation He aims for about 100mg  of protein daily and Meets with nutritionist once a month. Wt Readings from Last 3 Encounters:  05/26/23 232 lb 9.6 oz (105.5 kg)  03/23/23 222 lb (100.7 kg)  01/14/23 240 lb (108.9 kg)    Maintain zepbound dose, refill sent Encouraged to maintain a high protein and high fiber diet. Maintain daily exercise F/up in 59month in person      Relevant Medications   tirzepatide (ZEPBOUND) 15 MG/0.5ML Pen   Other Visit Diagnoses       Viral upper respiratory tract infection       Relevant Orders   POC COVID-19 (Completed)   POCT Influenza A/B (Completed)     Low TSH level       Relevant Orders   Thyroid Panel With TSH   Anti-TPO Ab (RDL)     URI Instructions: Encourage adequate oral hydration. Avoid decongestants if you have high blood pressure.  Ok to use Coricidin HBP for chest and sinus congestion Use mucinex DM or Robitussin  or delsym for cough without sinus congestion  You can use plain "Tylenol" or "Advil" for  fever, chills and achyness. Use cool mist humidifier at bedtime to help with nasal congestion and cough.  Return in about 2 months (around 07/24/2023) for Weight management in person.     Alysia Penna, NP

## 2023-05-26 NOTE — Assessment & Plan Note (Addendum)
6lbs weight loss noted with office scale (comparing 01/14/23 to 05/26/23). 03/23/23 weight was reported by patient from home scale. Use Miralax generic to help with constipation He aims for about 100mg  of protein daily and Meets with nutritionist once a month. Wt Readings from Last 3 Encounters:  05/26/23 232 lb 9.6 oz (105.5 kg)  03/23/23 222 lb (100.7 kg)  01/14/23 240 lb (108.9 kg)    Maintain zepbound dose, refill sent Encouraged to maintain a high protein and high fiber diet. Maintain daily exercise F/up in 59month in person

## 2023-05-26 NOTE — Patient Instructions (Addendum)
Go to lab Maintain current med doses. Negative flu and COVID  URI Instructions: Encourage adequate oral hydration. Avoid decongestants if you have high blood pressure.  Ok to use Coricidin HBP for chest and sinus congestion Use mucinex DM or Robitussin  or delsym for cough without sinus congestion  You can use plain "Tylenol" or "Advil" for fever, chills and achyness. Use cool mist humidifier at bedtime to help with nasal congestion and cough.  Cold/cough medications may have tylenol or ibuprofen or guaifenesin or dextromethophan in them, so be careful not to take beyond the recommended dose for each of these medications.   "Common cold" symptoms are usually triggered by a virus.  The antibiotics are usually not necessary. On average, a" viral cold" illness may take 7-10 days to resolve. Please, make an appointment if you are not better or if you're worse.

## 2023-05-26 NOTE — Assessment & Plan Note (Signed)
Under the care of psychology and psychiatry. Plans to start an inpatient behavioral health program next month

## 2023-05-27 LAB — THYROID PANEL WITH TSH
Free Thyroxine Index: 2.3 (ref 1.4–3.8)
T3 Uptake: 31 % (ref 22–35)
T4, Total: 7.3 ug/dL (ref 4.9–10.5)
TSH: 0.43 m[IU]/L (ref 0.40–4.50)

## 2023-05-28 ENCOUNTER — Other Ambulatory Visit (HOSPITAL_COMMUNITY): Payer: Self-pay | Admitting: Family

## 2023-05-31 ENCOUNTER — Encounter: Payer: Self-pay | Admitting: Nurse Practitioner

## 2023-05-31 ENCOUNTER — Other Ambulatory Visit (HOSPITAL_COMMUNITY): Payer: Self-pay | Admitting: Family

## 2023-06-01 LAB — ANTI-TPO AB (RDL): Anti-TPO Ab (RDL): <9 [IU]/mL (ref <9.0)

## 2023-06-06 DIAGNOSIS — F332 Major depressive disorder, recurrent severe without psychotic features: Secondary | ICD-10-CM | POA: Diagnosis not present

## 2023-06-06 DIAGNOSIS — F4310 Post-traumatic stress disorder, unspecified: Secondary | ICD-10-CM | POA: Diagnosis not present

## 2023-06-07 DIAGNOSIS — E785 Hyperlipidemia, unspecified: Secondary | ICD-10-CM | POA: Diagnosis not present

## 2023-06-07 DIAGNOSIS — F332 Major depressive disorder, recurrent severe without psychotic features: Secondary | ICD-10-CM | POA: Diagnosis not present

## 2023-06-07 DIAGNOSIS — I10 Essential (primary) hypertension: Secondary | ICD-10-CM | POA: Diagnosis not present

## 2023-06-07 DIAGNOSIS — F419 Anxiety disorder, unspecified: Secondary | ICD-10-CM | POA: Diagnosis not present

## 2023-06-07 DIAGNOSIS — F411 Generalized anxiety disorder: Secondary | ICD-10-CM | POA: Diagnosis not present

## 2023-06-07 DIAGNOSIS — E669 Obesity, unspecified: Secondary | ICD-10-CM | POA: Diagnosis not present

## 2023-06-07 DIAGNOSIS — R5383 Other fatigue: Secondary | ICD-10-CM | POA: Diagnosis not present

## 2023-06-07 DIAGNOSIS — M791 Myalgia, unspecified site: Secondary | ICD-10-CM | POA: Diagnosis not present

## 2023-06-07 DIAGNOSIS — F331 Major depressive disorder, recurrent, moderate: Secondary | ICD-10-CM | POA: Diagnosis not present

## 2023-06-07 DIAGNOSIS — R7309 Other abnormal glucose: Secondary | ICD-10-CM | POA: Diagnosis not present

## 2023-06-07 DIAGNOSIS — Z136 Encounter for screening for cardiovascular disorders: Secondary | ICD-10-CM | POA: Diagnosis not present

## 2023-06-07 DIAGNOSIS — F4310 Post-traumatic stress disorder, unspecified: Secondary | ICD-10-CM | POA: Diagnosis not present

## 2023-06-08 DIAGNOSIS — F332 Major depressive disorder, recurrent severe without psychotic features: Secondary | ICD-10-CM | POA: Diagnosis not present

## 2023-06-08 DIAGNOSIS — F4310 Post-traumatic stress disorder, unspecified: Secondary | ICD-10-CM | POA: Diagnosis not present

## 2023-06-09 DIAGNOSIS — F4310 Post-traumatic stress disorder, unspecified: Secondary | ICD-10-CM | POA: Diagnosis not present

## 2023-06-09 DIAGNOSIS — F332 Major depressive disorder, recurrent severe without psychotic features: Secondary | ICD-10-CM | POA: Diagnosis not present

## 2023-06-10 DIAGNOSIS — F4310 Post-traumatic stress disorder, unspecified: Secondary | ICD-10-CM | POA: Diagnosis not present

## 2023-06-10 DIAGNOSIS — F332 Major depressive disorder, recurrent severe without psychotic features: Secondary | ICD-10-CM | POA: Diagnosis not present

## 2023-06-10 NOTE — Psych (Signed)
 Virtual Visit via Video Note  I connected with Zachary [name] on 05/09/2023 at  9:00 AM EST by a video enabled telemedicine application and verified that I am speaking with the correct person using two identifiers.  Location: Patient: patient home Provider: clinical home office   I discussed the limitations of evaluation and management by telemedicine and the availability of in person appointments. The patient expressed understanding and agreed to proceed.  I discussed the assessment and treatment plan with the patient. The patient was provided an opportunity to ask questions and all were answered. The patient agreed with the plan and demonstrated an understanding of the instructions.   The patient was advised to call back or seek an in-person evaluation if the symptoms worsen or if the condition fails to improve as anticipated.  Zachary [name] was provided 240 minutes of non-face-to-face time during this encounter.   Randall Bastos, LCSW   [facility] PHP THERAPIST PROGRESS NOTE  Zachary [name] [id number]  Session Time: 9:00 - 10:00  Participation Level: Active  Behavioral Response: CasualAlertDepressed  Type of Therapy: Group Therapy  Treatment Goals addressed: Coping  Progress Towards Goals: Progressing  Interventions: CBT, DBT, Supportive, and Reframing  Summary: Zachary [name] is a 53 y.o. male who presents with depression symptoms.  Clinician led check-in regarding current stressors and situation, and review of patient completed daily inventory. Clinician utilized active listening and empathetic response and validated patient emotions. Clinician facilitated processing group on pertinent issues.?    Therapist Response:  Patient arrived within time allowed. Patient rates his mood at a 9 on a scale of 1-10 with 10 being best. Zachary [name] states he feels really good. Zachary [name] states he slept 9 hours and ate 1x. Zachary [name] reports he was surprised with a visit from his friends from his veteran support  group. Zachary [name] reports they flew in from [place]  to give him a Member of the Year plaque and spend the weekend with him. Zachary [name] describes it as a lifelong memory. Zachary [name] has brighter affect. Patient able to process. Patient engaged in discussion.          Session Time: 10:00 am - 11:00 am   Participation Level: Active   Behavioral Response: CasualAlertDepressed   Type of Therapy: Group Therapy   Treatment Goals addressed: Coping   Progress Towards Goals: Progressing   Interventions: CBT, DBT, Solution Focused, Strength-based, Supportive, and Reframing   Summary: Cln led processing group for Zachary [name]'s current struggles. Group members shared stressors and provided support and feedback. Cln brought in topics of boundaries, healthy relationships, and unhealthy thought processes to inform discussion.    Therapist Response: Zachary [name] able to process and provide support to group.            Session Time: 11:00 -12:00   Participation Level: Active   Behavioral Response: CasualAlertDepressed   Type of Therapy: Group Therapy   Treatment Goals addressed: Coping   Progress Towards Goals: Progressing   Interventions: CBT, DBT, Solution Focused, Strength-based, Supportive, and Reframing   Summary: Cln led discussion on impulsivity and how to manage it. Group members discussed struggles they experience with impulsivity and the consequences that have occurred. Cln explained the difference between impulsivity and unthoughtful decision making and how to create space between thought and action.    Therapist Response:  Zachary [name] engaged in discussion and is able to identify when reports impulsivity is most difficult.       Session Time: 12:00 -1:00   Participation Level: Active   Behavioral Response:  CasualAlertDepressed   Type of Therapy: Group therapy   Treatment Goals addressed: Coping   Progress Towards Goals: Progressing   Interventions: OT group   Summary: 12:00 - 12:50: Occupational Therapy  group with cln E. Hollan.  12:50 - 1:00 Clinician assessed for immediate needs, medication compliance and efficacy, and safety concerns.   Therapist Response: 12:00 - 12:50: See note 12:50 - 1:00 pm: At check-out, patient reports no immediate concerns. Patient demonstrates progress as evidenced by continued engagement and responsiveness to treatment. Patient denies SI/HI/self-harm thoughts at the end of group.    Suicidal/Homicidal: Nowithout intent/plan  Plan: Zachary [name] will continue in PHP while working to decrease depression symptoms, eliminate SI, and increase ability to manage symptoms in a healthy manner.   Collaboration of Care: Medication Management AEB T Lewis, NP  Patient/Guardian was advised Release of Information must be obtained prior to any record release in order to collaborate their care with an outside provider. Patient/Guardian was advised if they have not already done so to contact the registration department to sign all necessary forms in order for us  to release information regarding their care.   Consent: Patient/Guardian gives verbal consent for treatment and assignment of benefits for services provided during this visit. Patient/Guardian expressed understanding and agreed to proceed.   Diagnosis: Severe episode of recurrent major depressive disorder, without psychotic features (HCC) [F33.2]    1. Severe episode of recurrent major depressive disorder, without psychotic features (HCC)       Randall Bastos, LCSW

## 2023-06-10 NOTE — Psych (Signed)
 Virtual Visit via Video Note  I connected with Zachary [name] on 05/10/2023 at  9:00 AM EST by a video enabled telemedicine application and verified that I am speaking with the correct person using two identifiers.  Location: Patient: patient home Provider: clinical home office   I discussed the limitations of evaluation and management by telemedicine and the availability of in person appointments. The patient expressed understanding and agreed to proceed.  I discussed the assessment and treatment plan with the patient. The patient was provided an opportunity to ask questions and all were answered. The patient agreed with the plan and demonstrated an understanding of the instructions.   The patient was advised to call back or seek an in-person evaluation if the symptoms worsen or if the condition fails to improve as anticipated.  Pt was provided 240 minutes of non-face-to-face time during this encounter.   Randall Bastos, LCSW   [facility] PHP THERAPIST PROGRESS NOTE  BRITTON BERA [id number]  Session Time: 9:00 - 10:00  Participation Level: Active  Behavioral Response: CasualAlertDepressed  Type of Therapy: Group Therapy  Treatment Goals addressed: Coping  Progress Towards Goals: Progressing  Interventions: CBT, DBT, Supportive, and Reframing  Summary: Zachary [name] is a 53 y.o. male who presents with depression symptoms.  Clinician led check-in regarding current stressors and situation, and review of patient completed daily inventory. Clinician utilized active listening and empathetic response and validated patient emotions. Clinician facilitated processing group on pertinent issues.?    Therapist Response:  Patient arrived within time allowed. Patient rates his mood at a 4.5 on a scale of 1-10 with 10 being best. Pt states he feels tired of the ups and downs. Pt states he slept 9 hours and ate 1x. Pt reports he is experiencing a drop off of mood after the weekend  because it was great and now he's back to normal. Pt states the issue is his life and it feels like groundhog's day repeating over and over. Pt expresses resentment that his support is inconsistent and people are ready to get back to status quo which leaves him behind. Pt exhibits mood dependent thinking and is unable to access any positive reframing from the past week while in the negative head space. Patient able to process. Patient engaged in discussion.          Session Time: 10:00 am - 11:00 am   Participation Level: Active   Behavioral Response: CasualAlertDepressed   Type of Therapy: Group Therapy   Treatment Goals addressed: Coping   Progress Towards Goals: Progressing   Interventions: CBT, DBT, Solution Focused, Strength-based, Supportive, and Reframing   Summary: Cln introduced CBT and the way in which it can provide context for addressing stumbling blocks. Group discussed the problem is not the problem, the problem is how we're thinking about the problem and tried to change perspective on current struggles.    Therapist Response:  Pt engaged in discussion and reports understanding.            Session Time: 11:00 -12:00   Participation Level: Active   Behavioral Response: CasualAlertDepressed   Type of Therapy: Group Therapy   Treatment Goals addressed: Coping   Progress Towards Goals: Progressing   Interventions: CBT, DBT, Solution Focused, Strength-based, Supportive, and Reframing   Summary: Cln continued topic of CBT cognitive distortions and utilized handout Unhealthy Thought Patterns to review common examples of distorted thought to increase awareness of the distorted thoughts.   Therapist Response: Pt engaged in discussion  and is able to make connections from their life to the distorted thoughts.         Session Time: 12:00 -1:00   Participation Level: Active   Behavioral Response: CasualAlertDepressed   Type of Therapy: Group therapy    Treatment Goals addressed: Coping   Progress Towards Goals: Progressing   Interventions: OT group   Summary: 12:00 - 12:50: Occupational Therapy group with cln E. Hollan.  12:50 - 1:00 Clinician assessed for immediate needs, medication compliance and efficacy, and safety concerns.   Therapist Response: 12:00 - 12:50: See note 12:50 - 1:00 pm: At check-out, patient reports no immediate concerns. Patient demonstrates progress as evidenced by continued engagement and responsiveness to treatment. Patient denies SI/HI/self-harm thoughts at the end of group.    Suicidal/Homicidal: Nowithout intent/plan  Plan: Pt will continue in PHP while working to decrease depression symptoms, eliminate SI, and increase ability to manage symptoms in a healthy manner.   Collaboration of Care: Medication Management AEB T Lewis, NP  Patient/Guardian was advised Release of Information must be obtained prior to any record release in order to collaborate their care with an outside provider. Patient/Guardian was advised if they have not already done so to contact the registration department to sign all necessary forms in order for us  to release information regarding their care.   Consent: Patient/Guardian gives verbal consent for treatment and assignment of benefits for services provided during this visit. Patient/Guardian expressed understanding and agreed to proceed.   Diagnosis: Severe episode of recurrent major depressive disorder, without psychotic features (HCC) [F33.2]    1. Severe episode of recurrent major depressive disorder, without psychotic features (HCC)   2. PTSD (post-traumatic stress disorder)       Randall Bastos, LCSW

## 2023-06-10 NOTE — Psych (Signed)
 Virtual Visit via Video Note  I connected with Zachary [name] on 05/12/2023 at  9:00 AM EST by a video enabled telemedicine application and verified that I am speaking with the correct person using two identifiers.  Location: Patient: patient home Provider: clinical home office   I discussed the limitations of evaluation and management by telemedicine and the availability of in person appointments. The patient expressed understanding and agreed to proceed.  I discussed the assessment and treatment plan with the patient. The patient was provided an opportunity to ask questions and all were answered. The patient agreed with the plan and demonstrated an understanding of the instructions.   The patient was advised to call back or seek an in-person evaluation if the symptoms worsen or if the condition fails to improve as anticipated.  Pt was provided 240 minutes of non-face-to-face time during this encounter.   Randall Bastos, LCSW   [facility] BH PHP THERAPIST PROGRESS NOTE  Zachary [name] [id number]  Session Time: 9:00 - 10:00  Participation Level: Active  Behavioral Response: CasualAlertDepressed  Type of Therapy: Group Therapy  Treatment Goals addressed: Coping  Progress Towards Goals: Progressing  Interventions: CBT, DBT, Supportive, and Reframing  Summary: Zachary [name] is a 53 y.o. male who presents with depression symptoms.  Clinician led check-in regarding current stressors and situation, and review of patient completed daily inventory. Clinician utilized active listening and empathetic response and validated patient emotions. Clinician facilitated processing group on pertinent issues.?    Therapist Response:  Patient arrived within time allowed. Patient rates his mood at a 2 on a scale of 1-10 with 10 being best. Pt states he feels really frustrated. Pt states he slept 8 hours and ate 1x. Pt reports frustration with finding providers. Pt reports feeling unsupported. Pt  states he has a cold which is negatively impacting his arthritis and he spent yesterday resting from increased pain. Pt continues to struggle with mood dependent thinking.  Patient able to process. Patient engaged in discussion.          Session Time: 10:00 am - 11:00 am   Participation Level: Active   Behavioral Response: CasualAlertDepressed   Type of Therapy: Group Therapy   Treatment Goals addressed: Coping   Progress Towards Goals: Progressing   Interventions: CBT, DBT, Solution Focused, Strength-based, Supportive, and Reframing   Summary: Cln continued topic of CBT cognitive distortions. Cln utilized handout cognitive distortions to discuss common examples of distorted thoughts and group members worked to identify examples in their own life.    Therapist Response: Pt engaged in discussion and is able to identify which distortions are most problematic.            Session Time: 11:00 -12:00   Participation Level: Active   Behavioral Response: CasualAlertDepressed   Type of Therapy: Group Therapy   Treatment Goals addressed: Coping   Progress Towards Goals: Progressing   Interventions: CBT, DBT, Solution Focused, Strength-based, Supportive, and Reframing   Summary: Cln led discussion on CBT reframing and how to look for alternative possibilities to decrease distress from negative thinking. Cln utilized the CBT triangle to aid discussion. Group members shared difficult situations and worked with group to reframe the negative thinking present.    Therapist Response: Pt engaged in engineer, manufacturing.       Session Time: 12:00 -1:00   Participation Level: Active   Behavioral Response: CasualAlertDepressed   Type of Therapy: Group therapy   Treatment Goals addressed: Coping   Progress Towards Goals:  Progressing   Interventions: OT group   Summary: 12:00 - 12:50: Occupational Therapy group with cln E. Hollan.  12:50 - 1:00 Clinician assessed for  immediate needs, medication compliance and efficacy, and safety concerns.   Therapist Response: 12:00 - 12:50: See note 12:50 - 1:00 pm: At check-out, patient reports no immediate concerns. Patient demonstrates progress as evidenced by continued engagement and responsiveness to treatment. Patient denies SI/HI/self-harm thoughts at the end of group.    Suicidal/Homicidal: Nowithout intent/plan  Plan: Pt will discharge from PHP due to meeting treatment goals of decreased depression symptoms, eliminated SI, and increased ability to manage symptoms in a healthy manner. Pt has declined IOP and will step down to outpatient therapy and psychiatry. Pt and provider are aligned with discharge plans. Pt denies SI/HI at time of discharge.   Collaboration of Care: Medication Management AEB T Lewis, NP  Patient/Guardian was advised Release of Information must be obtained prior to any record release in order to collaborate their care with an outside provider. Patient/Guardian was advised if they have not already done so to contact the registration department to sign all necessary forms in order for us  to release information regarding their care.   Consent: Patient/Guardian gives verbal consent for treatment and assignment of benefits for services provided during this visit. Patient/Guardian expressed understanding and agreed to proceed.   Diagnosis: Severe episode of recurrent major depressive disorder, without psychotic features (HCC) [F33.2]    1. Severe episode of recurrent major depressive disorder, without psychotic features (HCC)   2. Sleep disturbance       Randall Bastos, LCSW

## 2023-06-11 DIAGNOSIS — F332 Major depressive disorder, recurrent severe without psychotic features: Secondary | ICD-10-CM | POA: Diagnosis not present

## 2023-06-11 DIAGNOSIS — F4310 Post-traumatic stress disorder, unspecified: Secondary | ICD-10-CM | POA: Diagnosis not present

## 2023-06-12 DIAGNOSIS — F4310 Post-traumatic stress disorder, unspecified: Secondary | ICD-10-CM | POA: Diagnosis not present

## 2023-06-12 DIAGNOSIS — F332 Major depressive disorder, recurrent severe without psychotic features: Secondary | ICD-10-CM | POA: Diagnosis not present

## 2023-06-13 ENCOUNTER — Ambulatory Visit (HOSPITAL_COMMUNITY): Payer: BC Managed Care – PPO | Admitting: Family

## 2023-06-13 ENCOUNTER — Encounter (HOSPITAL_COMMUNITY): Payer: Self-pay

## 2023-06-13 DIAGNOSIS — F4310 Post-traumatic stress disorder, unspecified: Secondary | ICD-10-CM | POA: Diagnosis not present

## 2023-06-13 DIAGNOSIS — F332 Major depressive disorder, recurrent severe without psychotic features: Secondary | ICD-10-CM | POA: Diagnosis not present

## 2023-06-14 DIAGNOSIS — R5383 Other fatigue: Secondary | ICD-10-CM | POA: Diagnosis not present

## 2023-06-14 DIAGNOSIS — F332 Major depressive disorder, recurrent severe without psychotic features: Secondary | ICD-10-CM | POA: Diagnosis not present

## 2023-06-14 DIAGNOSIS — F4310 Post-traumatic stress disorder, unspecified: Secondary | ICD-10-CM | POA: Diagnosis not present

## 2023-06-14 DIAGNOSIS — Z712 Person consulting for explanation of examination or test findings: Secondary | ICD-10-CM | POA: Diagnosis not present

## 2023-06-15 DIAGNOSIS — F4310 Post-traumatic stress disorder, unspecified: Secondary | ICD-10-CM | POA: Diagnosis not present

## 2023-06-15 DIAGNOSIS — F332 Major depressive disorder, recurrent severe without psychotic features: Secondary | ICD-10-CM | POA: Diagnosis not present

## 2023-06-16 DIAGNOSIS — M546 Pain in thoracic spine: Secondary | ICD-10-CM | POA: Diagnosis not present

## 2023-06-16 DIAGNOSIS — F4310 Post-traumatic stress disorder, unspecified: Secondary | ICD-10-CM | POA: Diagnosis not present

## 2023-06-16 DIAGNOSIS — F332 Major depressive disorder, recurrent severe without psychotic features: Secondary | ICD-10-CM | POA: Diagnosis not present

## 2023-06-17 DIAGNOSIS — F332 Major depressive disorder, recurrent severe without psychotic features: Secondary | ICD-10-CM | POA: Diagnosis not present

## 2023-06-17 DIAGNOSIS — F4310 Post-traumatic stress disorder, unspecified: Secondary | ICD-10-CM | POA: Diagnosis not present

## 2023-06-18 DIAGNOSIS — M546 Pain in thoracic spine: Secondary | ICD-10-CM | POA: Diagnosis not present

## 2023-06-18 DIAGNOSIS — F332 Major depressive disorder, recurrent severe without psychotic features: Secondary | ICD-10-CM | POA: Diagnosis not present

## 2023-06-18 DIAGNOSIS — F4310 Post-traumatic stress disorder, unspecified: Secondary | ICD-10-CM | POA: Diagnosis not present

## 2023-06-19 DIAGNOSIS — F332 Major depressive disorder, recurrent severe without psychotic features: Secondary | ICD-10-CM | POA: Diagnosis not present

## 2023-06-19 DIAGNOSIS — F4310 Post-traumatic stress disorder, unspecified: Secondary | ICD-10-CM | POA: Diagnosis not present

## 2023-06-20 DIAGNOSIS — F4310 Post-traumatic stress disorder, unspecified: Secondary | ICD-10-CM | POA: Diagnosis not present

## 2023-06-20 DIAGNOSIS — F332 Major depressive disorder, recurrent severe without psychotic features: Secondary | ICD-10-CM | POA: Diagnosis not present

## 2023-06-21 DIAGNOSIS — R5383 Other fatigue: Secondary | ICD-10-CM | POA: Diagnosis not present

## 2023-06-21 DIAGNOSIS — F4310 Post-traumatic stress disorder, unspecified: Secondary | ICD-10-CM | POA: Diagnosis not present

## 2023-06-21 DIAGNOSIS — F332 Major depressive disorder, recurrent severe without psychotic features: Secondary | ICD-10-CM | POA: Diagnosis not present

## 2023-06-22 DIAGNOSIS — F332 Major depressive disorder, recurrent severe without psychotic features: Secondary | ICD-10-CM | POA: Diagnosis not present

## 2023-06-22 DIAGNOSIS — F4310 Post-traumatic stress disorder, unspecified: Secondary | ICD-10-CM | POA: Diagnosis not present

## 2023-06-23 DIAGNOSIS — F4310 Post-traumatic stress disorder, unspecified: Secondary | ICD-10-CM | POA: Diagnosis not present

## 2023-06-23 DIAGNOSIS — F332 Major depressive disorder, recurrent severe without psychotic features: Secondary | ICD-10-CM | POA: Diagnosis not present

## 2023-07-04 ENCOUNTER — Ambulatory Visit (HOSPITAL_COMMUNITY): Payer: BC Managed Care – PPO | Admitting: Licensed Clinical Social Worker

## 2023-07-14 ENCOUNTER — Ambulatory Visit (HOSPITAL_COMMUNITY): Payer: BC Managed Care – PPO | Admitting: Licensed Clinical Social Worker

## 2023-07-14 ENCOUNTER — Encounter (HOSPITAL_COMMUNITY): Payer: Self-pay

## 2023-07-14 DIAGNOSIS — F332 Major depressive disorder, recurrent severe without psychotic features: Secondary | ICD-10-CM

## 2023-07-14 DIAGNOSIS — F4310 Post-traumatic stress disorder, unspecified: Secondary | ICD-10-CM | POA: Diagnosis not present

## 2023-07-14 NOTE — Progress Notes (Signed)
 THERAPIST PROGRESS NOTE   Session Date: 07/14/2023  Session Time: 0805 - 0905  Participation Level: Active  Behavioral Response: CasualAlertEuthymic  Type of Therapy: Individual Therapy  Treatment Goals addressed:  Anger Management    STG: Lamontae will identify situations, thoughts, and feelings that trigger internal anger, and/or angry/aggressive actions as evidenced by self-report     LTG: "Ensure it doesn't impact anyone else"       OP Depression    LTG: Reduce frequency, intensity, and duration of depression symptoms so that daily functioning is improved     LTG: Increase coping skills to manage depression and improve ability to perform daily activities      STG: Jurell will identify cognitive patterns and beliefs that support depression     LTG: "Improving my mindset"        ProgressTowards Goals: Initial  Interventions: CBT, Motivational Interviewing, Solution Focused, and Supportive  Summary: Kotaro is a 53 y.o. male with past psych history of MDD and PTSD, presenting for follow-up therapy session in efforts to improve management of depressive symptoms.   - Patient actively engaged in introductory session with provider, openly engaging in brief review of outline of initial session. Engaged in review of nutritional screening and pain screening in assessment of any additional presenting medical needs. Pt openly engaged in extensive review of hx of depressive sxs, detailing of experiencing increased sxs in Dec. 24, leading to increased SI w/ plan, intent, means, resulting in presenting to [facility] for eval., leading to PHP recommendation. Pt further detailed of ineffectiveness of PHP in meeting individual presenting needs, resulting in early termination. Pt shared of having been connected with [facility] residential tx program for approx 3 weeks between [facility] discharge and getting connected with OPT provider, sharing of having worked on breathe work, finding EMDR  beneficial, feeling of having laid the foundation of obtaining skills to begin developing from throughout furture course of tx. - Actively engaged in identification of individualized tx goals in development of tx plan towards management of depressive and PTSD sxs.  Patient responded well to interventions. Patient continues to meet criteria for MDD and PTSD. Patient will continue to benefit from engagement in outpatient therapy due to being the least restrictive service to meet presenting needs.      07/14/2023    8:30 AM 05/26/2023    8:31 AM 04/21/2023   11:47 AM 03/23/2023    8:11 AM 01/14/2023    1:29 PM  Depression screen PHQ 2/9  Decreased Interest 0 0 1 0 1  Down, Depressed, Hopeless 0 0 2 0 1  PHQ - 2 Score 0 0 3 0 2  Altered sleeping 1  3  2   Tired, decreased energy 1  3  2   Change in appetite 1  3  0  Feeling bad or failure about yourself  0  3  1  Trouble concentrating 0  2  0  Moving slowly or fidgety/restless 0  0  0  Suicidal thoughts 0  2  0  PHQ-9 Score 3  19  7   Difficult doing work/chores Not difficult at all  Extremely dIfficult  Somewhat difficult   Flowsheet Row Counselor from 07/14/2023 in [facility] from 04/21/2023 in [facility]PITAL[facility] PROGRAM ED from 04/13/2023 in [facility]  C-SSRS RISK CATEGORY High Risk High Risk High Risk       Suicidal/Homicidal: Nowithout intent/plan  Therapist Response: Clinician utilized CBT, MI, Solution  focused and supportive reflection techniques to address presenting challenges.   -Clinician actively engaged pt in introductory check-in, assessing mood and affect, actively reviewing completed intake docs in exploration of additional presenting medical needs.  - Re-administered PHQ-9 and GAD-7, in efforts of determining baseline score of initial visit. - Actively listened to pt's detailed reports of hx of depressive sxs, hx of  crisis, tx hx, and areas of needed work. - Elicitied pts thoughts and perspectives in developing individualized tx goals to establish tx plan.  Clinician reassessed severity of presenting sxs, and presence of any safety concerns. Clinician provided support and empathy to patient during session.  Plan: Return again in 1 weeks.  Diagnosis:  Encounter Diagnoses  Name Primary?   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  Patient/Guardian was advised Release of Information must be obtained prior to any record release in order to collaborate their care with an outside provider. Patient/Guardian was advised if they have not already done so to contact the registration department to sign all necessary forms in order for [place] to release information regarding their care.   Consent: Patient/Guardian gives verbal consent for treatment and assignment of benefits for services provided during this visit. Patient/Guardian expressed understanding and agreed to proceed.   Leisa Lenz, MSW, LCSW 07/14/2023,  9:02 AM

## 2023-07-19 ENCOUNTER — Ambulatory Visit (HOSPITAL_COMMUNITY): Admitting: Licensed Clinical Social Worker

## 2023-07-19 DIAGNOSIS — F332 Major depressive disorder, recurrent severe without psychotic features: Secondary | ICD-10-CM

## 2023-07-19 DIAGNOSIS — F4310 Post-traumatic stress disorder, unspecified: Secondary | ICD-10-CM

## 2023-07-19 NOTE — Progress Notes (Unsigned)
 THERAPIST PROGRESS NOTE  Session Date: 07/19/2023  Session Time: 1005 - 1101 Virtual Visit via Video Note  I connected with Zachary [name] on 07/19/23 at 10:05 AM EDT by a video enabled telemedicine application and verified that I am speaking with the correct person using two identifiers.  Location: Patient: Home Provider: [facility] OPT Office   I discussed the limitations of evaluation and management by telemedicine and the availability of in person appointments. The patient expressed understanding and agreed to proceed.  I discussed the assessment and treatment plan with the patient. The patient was provided an opportunity to ask questions and all were answered. The patient agreed with the plan and demonstrated an understanding of the instructions.   The patient was advised to call back or seek an in-person evaluation if the symptoms worsen or if the condition fails to improve as anticipated.  I provided 55 minutes of non-face-to-face time during this encounter.  Participation Level: Active  Behavioral Response: CasualAlertEuthymic  Type of Therapy: Individual Therapy  Treatment Goals addressed:  Anger Management    STG: Zachary [name] will identify situations, thoughts, and feelings that trigger internal anger, and/or angry/aggressive actions as evidenced by self-report     LTG: "Ensure it doesn't impact anyone else"       OP Depression    LTG: Reduce frequency, intensity, and duration of depression symptoms so that daily functioning is improved     LTG: Increase coping skills to manage depression and improve ability to perform daily activities      STG: Zachary [name] will identify cognitive patterns and beliefs that support depression     LTG: "Improving my mindset"        ProgressTowards Goals: Progressing  Interventions: CBT, Motivational Interviewing, Solution Focused, and Supportive  Summary: Zachary [name] is a 53 y.o. male with past psych history of MDD and PTSD,  presenting for follow-up therapy session in efforts to improve management of depressive symptoms.  Patient actively engaged in introductory check-in, presenting in overall pleasant moods and congruent affect throughout duration of session.  Patient actively detailed recent events, sharing of having returned to work yesterday in-person/on-site, and working from home today, sharing of feeling good overall to be back at work.  Patient further shared details of recent weekend events, sharing of time spent engaging in pastime activities, identifying limited variances in moods, although noting of mild unhappiness experienced when returning home after having spent time with friends at a local support lounge over the weekend.  Patient further engaged in exploration of presenting stressors, identifying main stressor to be that of his house, sharing that it is currently needs a significant amount of work done, varying from siding, deck work and yard work, that is mainly going to require finances as patient is unable to do necessary tasks himself due to heart attack.  Patient further shared of experiencing no significant work stress, or other related stressors.  Began exploration of stress and/or challenges related to heart attack, sharing of limited capabilities, and limited physical capacity, did engage in walking for a while, but didn't seem to help.  Actively processed potential benefits of following up with cardiology and efforts to explore any factors that may warrant further medical attention and/or support in navigating or alleviating presenting symptoms.  Patient responded well to interventions. Patient continues to meet criteria for MDD and PTSD. Patient will continue to benefit from engagement in outpatient therapy due to being the least restrictive service to meet presenting needs.      07/14/2023  8:30 AM 05/26/2023    8:31 AM 04/21/2023   11:47 AM 03/23/2023    8:11 AM 01/14/2023    1:29 PM  Depression  screen PHQ 2/9  Decreased Interest 0 0 1 0 1  Down, Depressed, Hopeless 0 0 2 0 1  PHQ - 2 Score 0 0 3 0 2  Altered sleeping 1  3  2   Tired, decreased energy 1  3  2   Change in appetite 1  3  0  Feeling bad or failure about yourself  0  3  1  Trouble concentrating 0  2  0  Moving slowly or fidgety/restless 0  0  0  Suicidal thoughts 0  2  0  PHQ-9 Score 3  19  7   Difficult doing work/chores Not difficult at all  Extremely dIfficult  Somewhat difficult   Flowsheet Row Counselor from 07/14/2023 in [facility] from 04/21/2023 in BEHAVIORAL [facility]ALTH PARTIAL [facility]OSPITALIZATION PROGRAM ED from 04/13/2023 in [facility]  C-SSRS RISK CATEGORY High Risk High Risk High Risk       Suicidal/Homicidal: Nowithout intent/plan  Therapist Response: Clinician utilized CBT, MI, Solution focused and supportive reflection techniques to address presenting challenges.   - Actively engaged patient in introductory check-in, assessing presenting mood and affect, actively listening to recounts of recent events, actively listening further details in relation to patient's transition back to work, weekend events, and preferred pastimes. -Actively listened to patient's reports of ease of transitions back to work, eliciting further reflection surrounding feelings and perspectives regarding transition and/or preparation/anticipation since being out for 3 months. -Actively engaged patient in processing primary presenting stressors, utilizing Socratic questioning to further explore patient's thoughts and feelings surrounding implications of such stressors. -Reviewed scheduling of future sessions and explored any necessary adjustments.  Clinician reassessed severity of presenting sxs, and presence of any safety concerns. Clinician provided support and empathy to patient during session.  Plan: Return again in 1 weeks.  Diagnosis:  Encounter  Diagnoses  Name Primary?   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  Patient/Guardian was advised Release of Information must be obtained prior to any record release in order to collaborate their care with an outside provider. Patient/Guardian was advised if they have not already done so to contact the registration department to sign all necessary forms in order for [place] to release information regarding their care.   Consent: Patient/Guardian gives verbal consent for treatment and assignment of benefits for services provided during this visit. Patient/Guardian expressed understanding and agreed to proceed.   Leisa Lenz, MSW, LCSW 07/19/2023,  9:35 PM

## 2023-07-26 ENCOUNTER — Ambulatory Visit (HOSPITAL_COMMUNITY): Admitting: Licensed Clinical Social Worker

## 2023-07-26 DIAGNOSIS — F332 Major depressive disorder, recurrent severe without psychotic features: Secondary | ICD-10-CM | POA: Diagnosis not present

## 2023-07-26 DIAGNOSIS — F4310 Post-traumatic stress disorder, unspecified: Secondary | ICD-10-CM | POA: Diagnosis not present

## 2023-07-26 NOTE — Progress Notes (Unsigned)
 THERAPIST PROGRESS NOTE  Session Date: 07/26/2023  Session Time: 1414 - 1504 Virtual Visit via Video Note  I connected with Zachary [name] on 07/26/23 at 2:14 PM EDT by a video enabled telemedicine application and verified that I am speaking with the correct person using two identifiers.  Location: Patient: Home Provider: [facility] [facility] Office   I discussed the limitations of evaluation and management by telemedicine and the availability of in person appointments. The patient expressed understanding and agreed to proceed.  I discussed the assessment and treatment plan with the patient. The patient was provided an opportunity to ask questions and all were answered. The patient agreed with the plan and demonstrated an understanding of the instructions.   The patient was advised to call back or seek an in-person evaluation if the symptoms worsen or if the condition fails to improve as anticipated.  I provided 50 minutes of non-face-to-face time during this encounter.  Participation Level: Active  Behavioral Response: CasualAlertEuthymic  Type of Therapy: Individual Therapy  Treatment Goals addressed:  Anger Management    STG: Sulaiman will identify situations, thoughts, and feelings that trigger internal anger, and/or angry/aggressive actions as evidenced by self-report     LTG: "Ensure it doesn't impact anyone else"       OP Depression    LTG: Reduce frequency, intensity, and duration of depression symptoms so that daily functioning is improved     LTG: Increase coping skills to manage depression and improve ability to perform daily activities      STG: Uzziah will identify cognitive patterns and beliefs that support depression     LTG: "Improving my mindset"       ProgressTowards Goals: Progressing  Interventions: CBT, Motivational Interviewing, Solution Focused, and Supportive  Summary: Zachary [name] is a 53 y.o. male with past psych history of MDD and PTSD, presenting  for follow-up therapy session in efforts to improve management of depressive symptoms. ***  *** inc depressive sxs, blow up with best friend, disappointed in self for not treating him better, unrealsitic expectations, not really fair, definitely affected friendship don't think it's going to be the same, conflict occurred last Wednesday, unsure of what catalyst was or if any, unsure why he annoys me so easily. Has reflected on issue, unable to determine why escalated so quickly, lashing out, saying  unkind things. Feel can take things out on him, same kind of relationship with ex-wife, fear of abadonment so chose to end on own terms,  Stress thresholds, desire for increased stress to meet stress threshold,   Explored hx of anger, social hx of anger in men, social expectations for men and anger, rage, warrior. Further explored pt's anger, and implications of recent medical stress and/or traumatic experiences proving to impact pt's perspectives and having to compromise and accept limitations, potentially proving to impact pt's feelings and result in increased frustrations. Continue journaling, incorporate reflection of moods, add gratitude points.    Patient further shared of experiencing no significant work stress, or other related stressors.  Began exploration of stress and/or challenges related to heart attack, sharing of limited capabilities, and limited physical capacity, did engage in walking for a while, but didn't seem to help.    Actively processed potential benefits of following up with cardiology and efforts to explore any factors that may warrant further medical attention and/or support in navigating or alleviating presenting symptoms.  Patient responded well to interventions. Patient continues to meet criteria for MDD and PTSD. Patient will continue to benefit  from engagement in outpatient therapy due to being the least restrictive service to meet presenting needs.      07/26/2023    2:19  PM 07/14/2023    8:30 AM 05/26/2023    8:31 AM 04/21/2023   11:47 AM 03/23/2023    8:11 AM  Depression screen PHQ 2/9  Decreased Interest 1 0 0 1 0  Down, Depressed, Hopeless 1 0 0 2 0  PHQ - 2 Score 2 0 0 3 0  Altered sleeping 1 1  3    Tired, decreased energy 1 1  3    Change in appetite 0 1  3   Feeling bad or failure about yourself  1 0  3   Trouble concentrating 0 0  2   Moving slowly or fidgety/restless 0 0  0   Suicidal thoughts 0 0  2   PHQ-9 Score 5 3  19    Difficult doing work/chores Somewhat difficult Not difficult at all  Extremely dIfficult    Flowsheet Row Counselor from 07/14/2023 in [facility] from 04/21/2023 in BEHAVIORAL HEALTH PARTIAL [facility]OSPITALIZATION PROGRAM ED from 04/13/2023 in [facility]  C-SSRS RISK CATEGORY High Risk High Risk High Risk       Suicidal/Homicidal: Nowithout intent/plan  Therapist Response: Clinician utilized CBT, MI, Solution focused and supportive reflection techniques to address presenting challenges.   - ***      Actively engaged patient in introductory check-in, assessing presenting mood and affect, actively listening to recounts of recent events, actively listening further details in relation to patient's transition back to work, weekend events, and preferred pastimes. -Actively listened to patient's reports of ease of transitions back to work, eliciting further reflection surrounding feelings and perspectives regarding transition and/or preparation/anticipation since being out for 3 months. -Actively engaged patient in processing primary presenting stressors, utilizing Socratic questioning to further explore patient's thoughts and feelings surrounding implications of such stressors. -Reviewed scheduling of future sessions and explored any necessary adjustments.  Clinician reassessed severity of presenting sxs, and presence of any safety concerns. Clinician  provided support and empathy to patient during session.  Plan: Return again in 1 weeks.  Diagnosis:  Encounter Diagnoses  Name Primary?   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  Patient/Guardian was advised Release of Information must be obtained prior to any record release in order to collaborate their care with an outside provider. Patient/Guardian was advised if they have not already done so to contact the registration department to sign all necessary forms in order for [place] to release information regarding their care.   Consent: Patient/Guardian gives verbal consent for treatment and assignment of benefits for services provided during this visit. Patient/Guardian expressed understanding and agreed to proceed.   Leisa Lenz, MSW, LCSW 07/26/2023,  2:21 PM

## 2023-07-29 ENCOUNTER — Other Ambulatory Visit: Payer: Self-pay | Admitting: Nurse Practitioner

## 2023-07-29 DIAGNOSIS — E6609 Other obesity due to excess calories: Secondary | ICD-10-CM

## 2023-07-29 NOTE — Telephone Encounter (Signed)
 Ki'Asha contact pt regarding med refill request for tirzepatide (ZEPBOUND) 15 MG/0.5ML Pen, explaining that he is due for a FU appt with Claris Gower for med refill. Pt becomes upset and I take over the call.  I explained to Mr. Bowland that per Charlotte's 05/26/23 OV note, pt is due to FU on 07/24/2023. Pt gets upset stating, "this is just a way for you all to get money. I have a friend who is on this medication and she does not have to do any of this. I do not like Charlotte or her level of care." I explain that at this point, we can do a transfer of care and asked if he prefers a NP or MD. He replies MD, I tell him that would be Dr. Veto Kemps and Dr. Janee Morn. I explained the TOC process and he asked how soon could he expect it to happen. I explain that it would depend on which provider and when their earliest available for a NP appt is. He asked what his options are for the meantime to get the medication, I replay that he can schedule an appointment with Claris Gower to follow up and she can send in the refill. He begins to go on saying something, upset. I explain that I am not the provider and all I can do is follow the provider's written orders. I explain that he has the right to see/transfer/change to whomever he wishes for a PCP. He states, "so you are saying I should see another doctor." I state "that is totally up to you." He said, "oh that's great," and disconnected the call.

## 2023-08-01 ENCOUNTER — Ambulatory Visit (HOSPITAL_COMMUNITY): Admitting: Licensed Clinical Social Worker

## 2023-08-01 ENCOUNTER — Encounter: Payer: Self-pay | Admitting: Nurse Practitioner

## 2023-08-01 DIAGNOSIS — F332 Major depressive disorder, recurrent severe without psychotic features: Secondary | ICD-10-CM | POA: Diagnosis not present

## 2023-08-01 NOTE — Progress Notes (Signed)
 THERAPIST PROGRESS NOTE  Session Date: 08/01/2023  Session Time: 1002 - 1050 Virtual Visit via Video Note  I connected with Zachary [name] on 08/01/23 at 10:02 AM EDT by a video enabled telemedicine application and verified that I am speaking with the correct person using two identifiers.  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 The patient expressed understanding and agreed to proceed.  I discussed the assessment and treatment plan with the patient. The patient was provided an opportunity to ask questions and all were answered. The patient agreed with the plan and demonstrated an understanding of the instructions.   The patient was advised to call back or seek an in-person evaluation if the symptoms worsen or if the condition fails to improve as anticipated.  I provided 48 minutes of non-face-to-face time during this encounter.  Participation Level: Active  Behavioral Response: CasualAlertDepressed  Type of Therapy: Individual Therapy   Treatment Goals addressed:  Anger Management    STG: Zachary [name] will identify situations, thoughts, and feelings that trigger internal anger, and/or angry/aggressive actions as evidenced by self-report     LTG: "Ensure it doesn't impact anyone else"       OP Depression    LTG: Reduce frequency, intensity, and duration of depression symptoms so that daily functioning is improved     LTG: Increase coping skills to manage depression and improve ability to perform daily activities      STG: Zachary [name] will identify cognitive patterns and beliefs that support depression     LTG: "Improving my mindset"       ProgressTowards Goals: Not Progressing  Interventions: CBT, Motivational Interviewing, and Supportive  Summary: Zachary [name] is a 53 y.o. male with past psych history of MDD and PTSD,  presenting for follow-up therapy session in efforts to improve management of depressive symptoms.   Patient actively engaged in introductory check-in, presenting in depressed moods with congruent affect throughout duration of visit.  Patient briefly detailed events of past week since previous session, sharing of things going pretty well overall.  Patient actively engaged in continued exploration of previously identified stressors surrounding relationship with best friend, noting of having spoken to best friend and feeling of friend having not given up on friendship, but also being aware of there to be significant rebuilding within relationship and for friend to reestablish patient's trust since redirecting anger and aggression towards friend last week.  Patient actively engaged in exploration of what the process may look like, identifying intent to become more intentional and questioning his own emotions, being mindful in the moment, explore why anger is escalating, identifying warning signs, and how to respond appropriately.  Engaged in brief review of "anger iceberg "handout, reflecting on prior session and factors related to anger oftentimes being a secondary emotion and expressed outwardly more frequently than alternate suppressed emotions.  Further engaged in exploration of "cycle of anger" handout, processing relationships between triggering events, negative thoughts, automatic response/warning signs, and behavioral responses/reactions, exploring patient's familiarity with various steps.  Actively explored "anger warning signs" handout to process and support patient in identifying overall awareness of present warning signs and how he responds to anger provoking events.  Finally explored "anger diary" handout, encouraging patient to incorporate method into regular journaling techniques to better support patient in identifying triggers, warning signs, reactions/responses, and outcomes of events.  Provided  patient with all handouts for continued  reference via email.   Patient responded well to interventions. Patient continues to meet criteria for MDD and PTSD. Patient will continue to benefit from engagement in outpatient therapy due to being the least restrictive service to meet presenting needs.      07/26/2023    2:19 PM 07/14/2023    8:30 AM 05/26/2023    8:31 AM 04/21/2023   11:47 AM 03/23/2023    8:11 AM  Depression screen PHQ 2/9  Decreased Interest 1 0 0 1 0  Down, Depressed, Hopeless 1 0 0 2 0  PHQ - 2 Score 2 0 0 3 0  Altered sleeping 1 1  3    Tired, decreased energy 1 1  3    Change in appetite 0 1  3   Feeling bad or failure about yourself  1 0  3   Trouble concentrating 0 0  2   Moving slowly or fidgety/restless 0 0  0   Suicidal thoughts 0 0  2   PHQ-9 Score 5 3  19    Difficult doing work/chores Somewhat difficult Not difficult at all  Extremely dIfficult    Flowsheet Row Counselor from 07/14/2023 in [facility] from 04/21/2023 in BEHAVIORAL HEALTH PARTIAL HOSPITALIZATION PROGRAM ED from 04/13/2023 in [facility]  C-SSRS RISK CATEGORY High Risk High Risk High Risk       Suicidal/Homicidal: Nowithout intent/plan  Therapist Response: Clinician utilized CBT, MI, Solution focused and supportive reflection techniques to address presenting challenges.   Actively engaged patient in introductory check-in, assessing presenting moods and affect, prompting patient's engagement in exploration of presenting moods, stresses, and recent events.  Utilized open-ended questions and efforts to further elicit patient recounts of recent events, actively listening to reflections of events, and reports of things going generally well and recent connection with her best friend.  Further processed instances of increased symptoms and efforts at managing.  Utilized Socratic questioning to further critical exploration of  thoughts and feelings surrounding continued stress and depressive and anxious feelings related to behaviors exhibited towards friend, validating expressed feelings and providing support to patient in processing.  Provided patient with various handout resources to support in behavioral activation surrounding deeper exploration and understanding of anger via journaling methods.  Clinician reassessed severity of presenting sxs, and presence of any safety concerns. Clinician provided support and empathy to patient during session.  Plan: Return again in 1 weeks.  Diagnosis:  Encounter Diagnosis  Name Primary?   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  Patient/Guardian was advised Release of Information must be obtained prior to any record release in order to collaborate their care with an outside provider. Patient/Guardian was advised if they have not already done so to contact the registration department to sign all necessary forms in order for [place] to release information regarding their care.   Consent: Patient/Guardian gives verbal consent for treatment and assignment of benefits for services provided during this visit. Patient/Guardian expressed understanding and agreed to proceed.   Leisa Lenz, MSW, LCSW 08/01/2023,  11:09 AM

## 2023-08-03 ENCOUNTER — Ambulatory Visit (HOSPITAL_COMMUNITY): Admitting: Licensed Clinical Social Worker

## 2023-08-08 ENCOUNTER — Ambulatory Visit (HOSPITAL_BASED_OUTPATIENT_CLINIC_OR_DEPARTMENT_OTHER): Admitting: Family

## 2023-08-08 DIAGNOSIS — R4589 Other symptoms and signs involving emotional state: Secondary | ICD-10-CM

## 2023-08-08 DIAGNOSIS — F332 Major depressive disorder, recurrent severe without psychotic features: Secondary | ICD-10-CM

## 2023-08-08 MED ORDER — MIRTAZAPINE 7.5 MG PO TABS: 7.5000 mg | ORAL_TABLET | Freq: Every day | ORAL | 1 refills | Status: DC
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
## 2023-08-09 ENCOUNTER — Ambulatory Visit (HOSPITAL_COMMUNITY): Admitting: Licensed Clinical Social Worker

## 2023-08-09 DIAGNOSIS — F332 Major depressive disorder, recurrent severe without psychotic features: Secondary | ICD-10-CM | POA: Diagnosis not present

## 2023-08-09 NOTE — Progress Notes (Unsigned)
 THERAPIST PROGRESS NOTE  Session Date: 08/09/2023  Session Time: 1501 - 1600 Virtual Visit via Video Note  I connected with Zachary [name] on 08/09/23 at 3:02 PM EDT by a video enabled telemedicine application and verified that I am speaking with the correct person using two identifiers.  Location: Patient: Home Provider: Home Office   I discussed the limitations of evaluation and management by telemedicine and the availability of in person appointments. The patient expressed understanding and agreed to proceed.  I discussed the assessment and treatment plan with the patient. The patient was provided an opportunity to ask questions and all were answered. The patient agreed with the plan and demonstrated an understanding of the instructions.   The patient was advised to call back or seek an in-person evaluation if the symptoms worsen or if the condition fails to improve as anticipated.  I provided 58 minutes of non-face-to-face time during this encounter.  Participation Level: Active  Behavioral Response: CasualAlertDysphoric  Type of Therapy: Individual Therapy   Treatment Goals addressed:  Anger Management    STG: Zae will identify situations, thoughts, and feelings that trigger internal anger, and/or angry/aggressive actions as evidenced by self-report     LTG: "Ensure it doesn't impact anyone else"       OP Depression    LTG: Reduce frequency, intensity, and duration of depression symptoms so that daily functioning is improved     LTG: Increase coping skills to manage depression and improve ability to perform daily activities      STG: [name] will identify cognitive patterns and beliefs that support depression     LTG: "Improving my mindset"       ProgressTowards Goals: Not Progressing  Interventions: CBT, Motivational Interviewing, and Supportive  Summary: Zachary [name] is a 53 y.o. male with past psych history of MDD and PTSD, presenting for follow-up  therapy session in efforts to improve management of depressive symptoms.   Pt actively engaged in introductory check-in, presenting and dysphoric moods with flat affect throughout duration of visits.  Patient actively engaged in reassessing presenting depressive symptoms, further engaging in reflection of symptoms experienced over recent weeks, noting of increase in challenges sleeping to be only variance in presenting symptoms.  Patient actively reflected on history, reporting of always having difficulties sleeping, sharing details specific to challenges, noting of laying in bed for a long time attempting to sleep but often times frequently kept awake by thinking of history and situations he could have handled differently.  Patient further detailed disturbed sleep and if he wakes up, trying not to let mind do anything, finding this works maybe 50% of the time.  Actively explored sleep hygiene and efforts patient implements to support sleep. Further explored grief surrounding loss of relationships and guilt/shame experienced regarding pt's actions. Processed forgiveness and challenges forgiving self for hx of behaviors, and further explored pt's prior abilities in forgiving others for having wronged him in the past, discussed hx of mothers behaviors and having accepted that parents did their best whatever that may have been. Processed having forgiven mother and not feeling animosity towards her, accepting relationship for what it is, not expecting anything else.  Patient shared that he does not know if he wants to ever be completely over divorce, uncertain as to whether this is intentional punishment for self and behaviors.  Processed benefits of utilizing thoughts journal at night to aid in increasing relaxation to support improved sleep.  Patient responded well to interventions. Patient continues to meet criteria for  MDD and PTSD. Patient will continue to benefit from engagement in outpatient therapy due to  being the least restrictive service to meet presenting needs.      08/09/2023    3:04 PM 07/26/2023    2:19 PM 07/14/2023    8:30 AM 05/26/2023    8:31 AM 04/21/2023   11:47 AM  Depression screen PHQ 2/9  Decreased Interest 0 1 0 0 1  Down, Depressed, Hopeless 1 1 0 0 2  PHQ - 2 Score 1 2 0 0 3  Altered sleeping 2 1 1  3   Tired, decreased energy 1 1 1  3   Change in appetite 0 0 1  3  Feeling bad or failure about yourself  1 1 0  3  Trouble concentrating 0 0 0  2  Moving slowly or fidgety/restless 0 0 0  0  Suicidal thoughts 0 0 0  2  PHQ-9 Score 5 5 3  19   Difficult doing work/chores Not difficult at all Somewhat difficult Not difficult at all  Extremely dIfficult   Flowsheet Row Counselor from 07/14/2023 in [facility] from 04/21/2023 in [facility]GRAM ED from 04/13/2023 in [facility]  C-SSRS RISK CATEGORY High Risk High Risk High Risk       Suicidal/Homicidal: Nowithout intent/plan  Therapist Response: Clinician utilized CBT, MI, Solution focused and supportive reflection techniques to address presenting challenges.   Actively engaged patient in introductory check-in, assessing presenting moods and affect, encouraging patient brief updates of daily events and exploring contributing factors to presenting moods.  Engage patient in reassessing of observed depressive symptoms experienced over recent weeks via PHQ-9, further eliciting patient's thoughts and perspectives in relation to observed symptoms and presenting challenges.  Utilized open-ended questions to support patient in processing thoughts and perspectives in relation to presenting challenges surrounding sleep difficulties and rumination on prior experiences and negative thoughts.  Utilized Socratic questioning to further evoke critical thinking surrounding patient's challenges and forgiving himself for prior  behaviors and actions within relationships, and how this proves to impact patient's abilities in focusing attention on improving behaviors and treatment of others.  Encouraged utilization of thought journal to support in noting thoughts and efforts to aid in relaxation to support improved sleep.  Clinician reassessed severity of presenting sxs, and presence of any safety concerns. Clinician provided support and empathy to patient during session.  Plan: Return again in 1 weeks.  Diagnosis:  Encounter Diagnosis  Name Primary?   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  Patient/Guardian was advised Release of Information must be obtained prior to any record release in order to collaborate their care with an outside provider. Patient/Guardian was advised if they have not already done so to contact the registration department to sign all necessary forms in order for [place] to release information regarding their care.   Consent: Patient/Guardian gives verbal consent for treatment and assignment of benefits for services provided during this visit. Patient/Guardian expressed understanding and agreed to proceed.   Leisa Lenz, MSW, LCSW 08/09/2023,  3:06 PM

## 2023-08-10 ENCOUNTER — Ambulatory Visit (HOSPITAL_COMMUNITY): Admitting: Licensed Clinical Social Worker

## 2023-08-15 ENCOUNTER — Ambulatory Visit (HOSPITAL_COMMUNITY): Admitting: Licensed Clinical Social Worker

## 2023-08-15 DIAGNOSIS — F332 Major depressive disorder, recurrent severe without psychotic features: Secondary | ICD-10-CM | POA: Diagnosis not present

## 2023-08-15 NOTE — Progress Notes (Signed)
 THERAPIST PROGRESS NOTE  Session Date: 08/15/2023  Session Time: 1301 - 1350 Virtual Visit via Video Note  I connected with Zachary [name] on 08/15/23 at 1:01 PM EDT by a video enabled telemedicine application and verified that I am speaking with the correct person using two identifiers.  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 The patient expressed understanding and agreed to proceed.  I discussed the assessment and treatment plan with the patient. The patient was provided an opportunity to ask questions and all were answered. The patient agreed with the plan and demonstrated an understanding of the instructions.   The patient was advised to call back or seek an in-person evaluation if the symptoms worsen or if the condition fails to improve as anticipated.  I provided 48 minutes of non-face-to-face time during this encounter.  Participation Level: Active  Behavioral Response: CasualAlertDysphoric and flat  Type of Therapy: Individual Therapy   Treatment Goals addressed:  Anger Management    STG: Zachary [name] will identify situations, thoughts, and feelings that trigger internal anger, and/or angry/aggressive actions as evidenced by self-report     LTG: "Ensure it doesn't impact anyone else"       OP Depression    LTG: Reduce frequency, intensity, and duration of depression symptoms so that daily functioning is improved     LTG: Increase coping skills to manage depression and improve ability to perform daily activities      STG: Zachary [name] will identify cognitive patterns and beliefs that support depression     LTG: "Improving my mindset"       ProgressTowards Goals: Not Progressing  Interventions: CBT, Motivational Interviewing, Supportive, and Reframing  Summary: Zachary [name] is a 53 y.o. male with past psych history of MDD and PTSD,  presenting for follow-up therapy session in efforts to improve management of depressive symptoms.   Patient proved engaged throughout today's session, presenting in overall dysphoric moods and flat affect throughout duration of visit.  Patient actively engaged in introductory check-in, sharing of things going generally well, not providing details of experiencing any significant distress.  Briefly engaged in review of prior visit and feeling of not deserving forgiveness, and prior visits surrounding relationship with Anger. Actively engaging in exploration of relationship with anger, processing recent instances of anger, with pt noting of observing while driving, feeling self becoming irritated and annoyed. Further exploring triggers, identifying driving slowly or driving on phone or distracted, noticing of self reacting to anger by laying on horn, yelling, cursing, etc. Actively engaged patient in exercise to explore patient's perspectives and typical responses to anger provoking situations, engaging patient in further processing of mild anxiousness proving to be exhibited via angry outbursts.  Actively engaged patient in further processing of perspectives surrounding individuals overall awareness of factors related to others lives, processing overall unawareness of challenges others proved to be experiencing and impacting their behaviors.  Patient found exercise to be unsupportive, expressing understanding of intent, however noting of "I do not think I will never get angry at others stupidity".  Further engaged patient in processing control factors, noting of when reacting to others behaviors with anger, how this proves to give others control of his own feelings and behaviors.  Explored technique of creating altered story/narrative when finding self triggered, to support and reduction of irritability or anger.  Patient responded well to interventions. Patient continues to meet criteria for MDD and PTSD.  Patient  will continue to benefit from engagement in outpatient therapy due to being the least restrictive service to meet presenting needs.      08/09/2023    3:04 PM 07/26/2023    2:19 PM 07/14/2023    8:30 AM 05/26/2023    8:31 AM 04/21/2023   11:47 AM  Depression screen PHQ 2/9  Decreased Interest 0 1 0 0 1  Down, Depressed, Hopeless 1 1 0 0 2  PHQ - 2 Score 1 2 0 0 3  Altered sleeping 2 1 1  3   Tired, decreased energy 1 1 1  3   Change in appetite 0 0 1  3  Feeling bad or failure about yourself  1 1 0  3  Trouble concentrating 0 0 0  2  Moving slowly or fidgety/restless 0 0 0  0  Suicidal thoughts 0 0 0  2  PHQ-9 Score 5 5 3  19   Difficult doing work/chores Not difficult at all Somewhat difficult Not difficult at all  Extremely dIfficult   Flowsheet Row Counselor from 07/14/2023 in [facility] at [facility] from 04/21/2023 in BE[facility]AVIORAL [facility]ALTH PARTIAL [facility]PITALIZATION PROGRAM ED from 04/13/2023 in [facility]  C-SSRS RISK CATEGORY High Risk High Risk High Risk       Suicidal/Homicidal: Nowithout intent/plan  Therapist Response: Clinician utilized CBT, MI, Solution focused and supportive reflection techniques to address presenting challenges.   Clinician actively greeted patient, engaging in introductory check-in, assessing presenting moods and affect, further prompting patient to detail factors contributing to presenting moods. Clinician actively engaged patient in reflection of prior visits, noting of having not proven to explore efforts surrounding increased awareness, and tracking of anger during most recent visit, actively engaging patient in such.  Utilized open-ended questions and efforts to elicit patient's recounts of recent events specifically in relation to anger and observed instances, as well as exploring individual efforts by patient to increase awareness.  Actively listened to patient's reflections of few  instances, supporting patient and actively processing triggering events, eliciting patient's identified thoughts and feelings experienced in such instances, and perspectives surrounding reactions.  Utilized Socratic questioning to further promote critical thinking by patient in processing anger provoking instances and contributing factors.  Utilized CBT, MI, reframing, and role play interventions to support patient in processing and understanding ultimate perspectives, responses, and approaches to better manage anger.  Clinician reassessed severity of presenting sxs, and presence of any safety concerns. Clinician provided support and empathy to patient during session.  Plan: Return again in 2 weeks.  Diagnosis:  Encounter Diagnosis  Name Primary?   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  Patient/Guardian was advised Release of Information must be obtained prior to any record release in order to collaborate their care with an outside provider. Patient/Guardian was advised if they have not already done so to contact the registration department to sign all necessary forms in order for us  to release information regarding their care.   Consent: Patient/Guardian gives verbal consent for treatment and assignment of benefits for services provided during this visit. Patient/Guardian expressed understanding and agreed to proceed.   Patsi Boots, MSW, LCSW 08/15/2023,  2:04 PM

## 2023-08-17 ENCOUNTER — Ambulatory Visit (HOSPITAL_COMMUNITY): Admitting: Licensed Clinical Social Worker

## 2023-08-22 ENCOUNTER — Encounter (HOSPITAL_COMMUNITY): Payer: Self-pay

## 2023-08-22 ENCOUNTER — Ambulatory Visit (HOSPITAL_COMMUNITY): Admitting: Licensed Clinical Social Worker

## 2023-08-24 ENCOUNTER — Ambulatory Visit (HOSPITAL_COMMUNITY): Admitting: Licensed Clinical Social Worker

## 2023-08-29 ENCOUNTER — Ambulatory Visit (HOSPITAL_COMMUNITY): Admitting: Licensed Clinical Social Worker

## 2023-08-29 DIAGNOSIS — F332 Major depressive disorder, recurrent severe without psychotic features: Secondary | ICD-10-CM | POA: Diagnosis not present
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
## 2023-08-31 ENCOUNTER — Ambulatory Visit (HOSPITAL_COMMUNITY): Admitting: Licensed Clinical Social Worker

## 2023-09-05 DIAGNOSIS — Z0189 Encounter for other specified special examinations: Secondary | ICD-10-CM | POA: Diagnosis not present

## 2023-09-09 ENCOUNTER — Ambulatory Visit (HOSPITAL_COMMUNITY): Admitting: Licensed Clinical Social Worker

## 2023-09-09 DIAGNOSIS — F4310 Post-traumatic stress disorder, unspecified: Secondary | ICD-10-CM | POA: Diagnosis not present

## 2023-09-09 DIAGNOSIS — F332 Major depressive disorder, recurrent severe without psychotic features: Secondary | ICD-10-CM

## 2023-09-09 NOTE — Progress Notes (Unsigned)
 THERAPIST PROGRESS NOTE  Session Date: 09/09/2023  Session Time: 1104 - 1202 Virtual Visit via Video Note  I connected with Zachary [name] on 09/09/23 at 11:04 AM EDT by a video enabled telemedicine application and verified that I am speaking with the correct person using two identifiers.  Location: Patient: Home Provider: Home office   I discussed the limitations of evaluation and management by telemedicine and the availability of in person appointments. The patient expressed understanding and agreed to proceed.  I discussed the assessment and treatment plan with the patient. The patient was provided an opportunity to ask questions and all were answered. The patient agreed with the plan and demonstrated an understanding of the instructions.   The patient was advised to call back or seek an in-person evaluation if the symptoms worsen or if the condition fails to improve as anticipated.  I provided 57 minutes of non-face-to-face time during this encounter.  Participation Level: Active  Behavioral Response: CasualAlertEuthymic  Type of Therapy: Individual Therapy   Treatment Goals addressed:  Anger Management    STG: Zachary [name] will identify situations, thoughts, and feelings that trigger internal anger, and/or angry/aggressive actions as evidenced by self-report     LTG: "Ensure it doesn't impact anyone else"       OP Depression    LTG: Reduce frequency, intensity, and duration of depression symptoms so that daily functioning is improved     LTG: Increase coping skills to manage depression and improve ability to perform daily activities      STG: Zachary [name] will identify cognitive patterns and beliefs that support depression     LTG: "Improving my mindset"       ProgressTowards Goals: Progressing  Interventions: CBT, Motivational Interviewing, Supportive, and Reframing  Summary: Zachary [name] is a 53 y.o. male with past psych history of MDD and PTSD, presenting for  follow-up therapy session in efforts to improve management of depressive symptoms.   Patient actively engaged throughout today's session, presenting in overall pleasant moods and congruent affect throughout visit, engaging in brief check-in, detailing recounts of recent events and factors contributing to presenting moods. ***  Reflecting on start of day, enjoying time outside on deck being off today, spending time reading through book that cigar support group have been reading through together. Extensively detailed increased levels of support through group,   Not really experiencing too many negative thoughts over the past two weeks, no significant instances of anger, has been some loneliness there but not constant, finding self that loneliness is not indicative of need to be in relationship due to recognizing need to work on self, and if opportunity were to present itself would focus on being a good partner. Shared of having been in 1 relationship since divorce and being one-sided, only being there for their needs, and not bringing up own issues/needs. Found relationship went too fast, was in mindset of really wanting to be with someone, noticed red flags but found self disregarding. Finding now that recognizing what do/don't want to deal with is important. ***   Patient responded well to interventions. Patient continues to meet criteria for MDD and PTSD. Patient will continue to benefit from engagement in outpatient therapy due to being the least restrictive service to meet presenting needs.      08/29/2023    1:58 PM 07/26/2023    2:16 PM 07/14/2023    8:30 AM 01/14/2023    1:29 PM  GAD 7 : Generalized Anxiety Score  Nervous, Anxious, on Edge 1 0  1 1  Control/stop worrying 1 0 0 1  Worry too much - different things 0 1 0 1  Trouble relaxing 1 1 0 1  Restless 0 0 0 0  Easily annoyed or irritable 1 1 1 1   Afraid - awful might happen 0 0 0 0  Total GAD 7 Score 4 3 2 5   Anxiety Difficulty  Somewhat difficult Somewhat difficult Somewhat difficult Somewhat difficult      08/29/2023    1:56 PM 08/09/2023    3:04 PM 07/26/2023    2:19 PM 07/14/2023    8:30 AM 05/26/2023    8:31 AM  Depression screen PHQ 2/9  Decreased Interest 1 0 1 0 0  Down, Depressed, Hopeless 0 1 1 0 0  PHQ - 2 Score 1 1 2  0 0  Altered sleeping 1 2 1 1    Tired, decreased energy 1 1 1 1    Change in appetite 0 0 0 1   Feeling bad or failure about yourself  0 1 1 0   Trouble concentrating 0 0 0 0   Moving slowly or fidgety/restless 0 0 0 0   Suicidal thoughts 0 0 0 0   PHQ-9 Score 3 5 5 3    Difficult doing work/chores Somewhat difficult Not difficult at all Somewhat difficult Not difficult at all    [employer] from 07/14/2023 in [facility] from 04/21/2023 in [facility] from 04/13/2023 in [facility]  C-SSRS RISK CATEGORY High Risk High Risk High Risk      Suicidal/Homicidal: Nowithout intent/plan  Therapist Response: Clinician utilized CBT, MI, Solution focused and supportive reflection techniques to address presenting challenges.   Clinician actively greeted patient upon joining virtual visit, engaging in introductory check-in, assessing presenting moods and affect, eliciting pt's reflection of events and factors contributing to presenting moods. ***   Actively listened to patient's extensive reflection of events surrounding recent trip, utilizing open-ended questions to further elicit patient's thoughts, feelings, and perspectives in relation to events and experienced stressors/challenges.  Utilized Socratic questioning to further encourage greater critical exploration of expressed thoughts and feelings surrounding history of trauma and implications of trauma on patient's increased anxiety experienced surrounding factors outside of patient's control.  Reassess presenting  depressive and anxious symptoms experienced over the past 2 weeks via PHQ-9 and GAD-7, reflecting on minimal variance and scores, further processing progressions in consideration of recent increased stress surrounding travels.  Utilize CBT, MI, and role-played interventions to support patient in reflection of recent events in processing thoughts and feelings surrounding presenting stressors and/or challenges.  Clinician reassessed severity of presenting sxs, and presence of any safety concerns. Clinician provided support and empathy to patient during session.  Plan: Return again in 1 weeks.  Diagnosis:  Encounter Diagnoses  Name Primary?   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  Patient/Guardian was advised Release of Information must be obtained prior to any record release in order to collaborate their care with an outside provider. Patient/Guardian was advised if they have not already done so to contact the registration department to sign all necessary forms in order for us  to release information regarding their care.   Consent: Patient/Guardian gives verbal consent for treatment and assignment of benefits for services provided during this visit. Patient/Guardian expressed understanding  and agreed to proceed.   Patsi Boots, MSW, LCSW 09/09/2023,  11:05 AM

## 2023-09-11 NOTE — Progress Notes (Incomplete)
 THERAPIST PROGRESS NOTE  Session Date: 09/09/2023  Session Time: 1104 - 1202 Virtual Visit via Video Note  I connected with Zachary [name] on 09/09/23 at 11:04 AM EDT by a video enabled telemedicine application and verified that I am speaking with the correct person using two identifiers.  Location: Patient: Home Provider: Home office   I discussed the limitations of evaluation and management by telemedicine and the availability of in person appointments. The patient expressed understanding and agreed to proceed.  I discussed the assessment and treatment plan with the patient. The patient was provided an opportunity to ask questions and all were answered. The patient agreed with the plan and demonstrated an understanding of the instructions.   The patient was advised to call back or seek an in-person evaluation if the symptoms worsen or if the condition fails to improve as anticipated.  I provided 57 minutes of non-face-to-face time during this encounter.  Participation Level: Active  Behavioral Response: CasualAlertEuthymic  Type of Therapy: Individual Therapy   Treatment Goals addressed:  Anger Management    STG: Zachary [name] will identify situations, thoughts, and feelings that trigger internal anger, and/or angry/aggressive actions as evidenced by self-report     LTG: "Ensure it doesn't impact anyone else"       OP Depression    LTG: Reduce frequency, intensity, and duration of depression symptoms so that daily functioning is improved     LTG: Increase coping skills to manage depression and improve ability to perform daily activities      STG: Zachary [name] will identify cognitive patterns and beliefs that support depression     LTG: "Improving my mindset"       ProgressTowards Goals: Progressing  Interventions: CBT, Motivational Interviewing, Supportive, and Reframing  Summary: Zachary [name] is a 53 y.o. male with past psych history of MDD and PTSD, presenting for  follow-up therapy session in efforts to improve management of depressive symptoms.   Patient actively engaged throughout session, presenting in overall pleasant moods and congruent affect throughout visit, engaging in brief check-in, detailing recounts of recent events and factors contributing to presenting moods. Pt reflecting on start of day, enjoying time outside on deck being off today, spending time reading through book that cigar support group have been reading through together. Extensively detailed increased levels of support through group,   Not really experiencing too many negative thoughts over the past two weeks, no significant instances of anger, has been some loneliness there but not constant, finding self that loneliness is not indicative of need to be in relationship due to recognizing need to work on self, and if opportunity were to present itself would focus on being a good partner. Shared of having been in 1 relationship since divorce and being one-sided, only being there for their needs, and not bringing up own issues/needs. Found relationship went too fast, was in mindset of really wanting to be with someone, noticed red flags but found self disregarding. Finding now that recognizing what do/don't want to deal with is important. ***   Patient responded well to interventions. Patient continues to meet criteria for MDD and PTSD. Patient will continue to benefit from engagement in outpatient therapy due to being the least restrictive service to meet presenting needs.      08/29/2023    1:58 PM 07/26/2023    2:16 PM 07/14/2023    8:30 AM 01/14/2023    1:29 PM  GAD 7 : Generalized Anxiety Score  Nervous, Anxious, on Edge 1 0 1 1  Control/stop worrying 1 0 0 1  Worry too much - different things 0 1 0 1  Trouble relaxing 1 1 0 1  Restless 0 0 0 0  Easily annoyed or irritable 1 1 1 1   Afraid - awful might happen 0 0 0 0  Total GAD 7 Score 4 3 2 5   Anxiety Difficulty Somewhat difficult  Somewhat difficult Somewhat difficult Somewhat difficult      08/29/2023    1:56 PM 08/09/2023    3:04 PM 07/26/2023    2:19 PM 07/14/2023    8:30 AM 05/26/2023    8:31 AM  Depression screen PHQ 2/9  Decreased Interest 1 0 1 0 0  Down, Depressed, Hopeless 0 1 1 0 0  PHQ - 2 Score 1 1 2  0 0  Altered sleeping 1 2 1 1    Tired, decreased energy 1 1 1 1    Change in appetite 0 0 0 1   Feeling bad or failure about yourself  0 1 1 0   Trouble concentrating 0 0 0 0   Moving slowly or fidgety/restless 0 0 0 0   Suicidal thoughts 0 0 0 0   PHQ-9 Score 3 5 5 3    Difficult doing work/chores Somewhat difficult Not difficult at all Somewhat difficult Not difficult at all    [employer] from 07/14/2023 in [facility] from 04/21/2023 in [facility] ED from 04/13/2023 in [facility]  C-SSRS RISK CATEGORY High Risk High Risk High Risk      Suicidal/Homicidal: Nowithout intent/plan  Therapist Response: Clinician utilized CBT, MI, Solution focused and supportive reflection techniques to address presenting challenges.   Clinician actively greeted patient upon joining virtual visit, engaging in introductory check-in, assessing presenting moods and affect, eliciting pt's reflection of events and factors contributing to presenting moods. ***   Actively listened to patient's extensive reflection of events surrounding recent trip, utilizing open-ended questions to further elicit patient's thoughts, feelings, and perspectives in relation to events and experienced stressors/challenges.  Utilized Socratic questioning to further encourage greater critical exploration of expressed thoughts and feelings surrounding history of trauma and implications of trauma on patient's increased anxiety experienced surrounding factors outside of patient's control.  Reassess presenting depressive and  anxious symptoms experienced over the past 2 weeks via PHQ-9 and GAD-7, reflecting on minimal variance and scores, further processing progressions in consideration of recent increased stress surrounding travels.  Utilize CBT, MI, and role-played interventions to support patient in reflection of recent events in processing thoughts and feelings surrounding presenting stressors and/or challenges.  Clinician reassessed severity of presenting sxs, and presence of any safety concerns. Clinician provided support and empathy to patient during session.  Plan: Return again in 1 weeks.  Diagnosis:  Encounter Diagnoses  Name Primary?  . 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 PTSD (post-traumatic stress disorder)    Collaboration of Care: Other none necessary at this time.  Patient/Guardian was advised Release of Information must be obtained prior to any record release in order to collaborate their care with an outside provider. Patient/Guardian was advised if they have not already done so to contact the registration department to sign all necessary forms in order for us  to release information regarding their care.   Consent: Patient/Guardian gives verbal consent for treatment and assignment of benefits for services provided during this visit. Patient/Guardian expressed understanding and agreed to  proceed.   Patsi Boots, MSW, LCSW 09/09/2023,  11:05 AM

## 2023-09-13 ENCOUNTER — Ambulatory Visit: Admitting: Family Medicine

## 2023-09-13 ENCOUNTER — Encounter: Payer: Self-pay | Admitting: Family Medicine

## 2023-09-13 VITALS — BP 130/85 | HR 85 | Temp 97.9°F | Resp 18 | Wt 256.5 lb

## 2023-09-13 DIAGNOSIS — K219 Gastro-esophageal reflux disease without esophagitis: Secondary | ICD-10-CM

## 2023-09-13 DIAGNOSIS — I2510 Atherosclerotic heart disease of native coronary artery without angina pectoris: Secondary | ICD-10-CM | POA: Diagnosis not present

## 2023-09-13 DIAGNOSIS — E66812 Obesity, class 2: Secondary | ICD-10-CM

## 2023-09-13 DIAGNOSIS — M1A9XX0 Chronic gout, unspecified, without tophus (tophi): Secondary | ICD-10-CM

## 2023-09-13 DIAGNOSIS — I10 Essential (primary) hypertension: Secondary | ICD-10-CM

## 2023-09-13 DIAGNOSIS — F332 Major depressive disorder, recurrent severe without psychotic features: Secondary | ICD-10-CM

## 2023-09-13 DIAGNOSIS — F17200 Nicotine dependence, unspecified, uncomplicated: Secondary | ICD-10-CM

## 2023-09-13 DIAGNOSIS — F5101 Primary insomnia: Secondary | ICD-10-CM

## 2023-09-13 DIAGNOSIS — Z6838 Body mass index (BMI) 38.0-38.9, adult: Secondary | ICD-10-CM

## 2023-09-13 DIAGNOSIS — E782 Mixed hyperlipidemia: Secondary | ICD-10-CM

## 2023-09-13 MED ORDER — ZEPBOUND 15 MG/0.5ML ~~LOC~~ SOAJ: 15.0000 mg | SUBCUTANEOUS | 3 refills | Status: DC

## 2023-09-13 MED ORDER — ASPIRIN 81 MG PO CHEW: 81.0000 mg | CHEWABLE_TABLET | Freq: Every day | ORAL | 3 refills | Status: DC

## 2023-09-13 MED ORDER — ESOMEPRAZOLE MAGNESIUM 40 MG PO CPDR: 40.0000 mg | DELAYED_RELEASE_CAPSULE | Freq: Every day | ORAL | 3 refills | Status: DC

## 2023-09-13 MED ORDER — ISOSORBIDE MONONITRATE ER 30 MG PO TB24: 30.0000 mg | ORAL_TABLET | Freq: Every day | ORAL | 3 refills | Status: DC

## 2023-09-13 MED ORDER — ATORVASTATIN CALCIUM 80 MG PO TABS: 80.0000 mg | ORAL_TABLET | Freq: Every day | ORAL | 3 refills | Status: AC

## 2023-09-13 MED ORDER — LOSARTAN POTASSIUM 25 MG PO TABS
12.5000 mg | ORAL_TABLET | Freq: Every day | ORAL | 3 refills | Status: AC
Start: 2023-09-13 — End: 2024-09-07

## 2023-09-13 MED ORDER — HYDROCHLOROTHIAZIDE 12.5 MG PO TABS: 12.5000 mg | ORAL_TABLET | Freq: Every day | ORAL | 3 refills | Status: DC

## 2023-09-13 MED ORDER — MELATONIN 5 MG PO CAPS
1.0000 | ORAL_CAPSULE | Freq: Every day | ORAL | 3 refills | Status: DC
Start: 2023-09-13 — End: 2024-02-07

## 2023-09-13 MED ORDER — FUROSEMIDE 20 MG PO TABS: 20.0000 mg | ORAL_TABLET | Freq: Every day | ORAL | 3 refills | Status: DC

## 2023-09-13 MED ORDER — TICAGRELOR 90 MG PO TABS
90.0000 mg | ORAL_TABLET | Freq: Two times a day (BID) | ORAL | 3 refills | Status: DC
Start: 2023-09-13 — End: 2024-02-07

## 2023-09-13 MED ORDER — ALLOPURINOL 100 MG PO TABS: 100.0000 mg | ORAL_TABLET | Freq: Every day | ORAL | 3 refills | Status: AC

## 2023-09-13 NOTE — Progress Notes (Signed)
 Assessment & Plan   Assessment/Plan:   Assessment & Plan CAD Managed with atorvastatin, aspirin, and Brilinta. Coordination with cardiology is crucial to avoid medication interactions and ensure optimal management. Increased risk of GI bleeds due to dual antiplatelet therapy. No recent cardiology follow-up. - Refill atorvastatin, aspirin, and Brilinta. - Schedule cardiology follow-up to review cardiac medication regimen and ensure coordination of care.  Hypertension Managed with losartan and other cardiac medications. Blood pressure is well-controlled. Coordination with cardiology is important for ongoing management. - Refill losartan. - Schedule cardiology follow-up for ongoing blood pressure management.  Hyperlipidemia Managed with atorvastatin 80mg  . LDL at 51, which is excellent. Coordination with cardiology is important for ongoing management. - Refill atorvastatin and Praluent. - Schedule cardiology follow-up for ongoing lipid management.  Gout Well-controlled with allopurinol . No flares in the past year. Dietary changes have contributed to management. - Refill allopurinol .  Gastroesophageal reflux disease (GERD) Managed with esomeprazole  40 mg once daily. Symptoms are well-controlled. - Refill esomeprazole  40 mg once daily.  Depression Managed with escitalopram  and mirtazapine . Mood is well-controlled. Aripiprazole  discontinued due to potential interactions. - Refill escitalopram  and mirtazapine .  Obesity Previously managed with Zepbound  15 mg, effective in weight reduction and improving energy levels. Off Zepbound  for two months, resulting in weight gain. Discussed potential side effects upon restarting at 15 mg dose. - Restart Zepbound  15 mg. - Monitor for side effects upon restarting medication.      Medications Discontinued During This Encounter  Medication Reason   QUEtiapine  (SEROQUEL ) 25 MG tablet    ARIPiprazole  (ABILIFY ) 5 MG tablet    esomeprazole   (NEXIUM ) 20 MG capsule    Melatonin 5 MG CAPS Reorder   aspirin 81 MG chewable tablet Reorder   atorvastatin (LIPITOR) 80 MG tablet Reorder   losartan (COZAAR) 25 MG tablet Reorder   ticagrelor (BRILINTA) 90 MG TABS tablet Reorder   hydrochlorothiazide  (HYDRODIURIL ) 12.5 MG tablet Reorder   isosorbide mononitrate (IMDUR) 30 MG 24 hr tablet Reorder   furosemide (LASIX) 20 MG tablet Reorder   allopurinol  (ZYLOPRIM ) 100 MG tablet Reorder   tirzepatide  (ZEPBOUND ) 15 MG/0.5ML Pen Reorder    Return in about 6 months (around 03/15/2024) for BP, HLD.        Subjective:   Encounter date: 09/13/2023  Zachary [name] is a 53 y.o. male who has GERD (gastroesophageal reflux disease); Hemorrhoids; Obesity; Primary hypertension; Bilateral leg edema; Gout; CAD (coronary atherosclerotic disease); History of MI (myocardial infarction); Tobacco use disorder; STEMI involving left anterior descending coronary artery [facility]); MDD (major depressive disorder), recurrent episode, severe (HCC); PTSD (post-traumatic stress disorder); and Low TSH level on their problem list..   He  has a past medical history of Anxiety, GERD (gastroesophageal reflux disease), Hyperlipidemia, Hypertension, and Myocardial infarction (HCC).Zachary [name]   He presents with chief complaint of Weight Loss (Rx refill for Zepbound  and other medications needed ) and transfer of care  (HM due- vaccination, colonoscopy, HIV and Hep C screenings ) .  History of Present Illness Zachary [name] is a 53 year old male who presents for transition of care and medication refills.  He is transitioning his care to establish with a new primary care provider and is seeking medication refills. He has been off Zepbound  for almost two months and has noticed weight gain since discontinuation.  He has a history of a heart attack and is currently on multiple cardiac medications including 81 mg aspirin, atorvastatin, Praluent, perindopril, and furosemide. He also  takes losartan for blood pressure  management. He has not seen his cardiologist, Dr. Ryan Coyer at [facility], since last year. No current chest pain and his heartburn is well controlled with esomeprazole  40 mg once daily.  For his gout, he takes allopurinol  and has not had a flare in about a year, attributing this to the effectiveness of the medication and dietary changes.  He is on escitalopram  20 mg and mirtazapine  7.5 mg for mood management, which he feels is well controlled. He was previously on aripiprazole  but has discontinued it due to potential interactions with his other medications.  He works in Consulting civil engineer at [employer] in [place] and has a Veterinary surgeon, Mr. Drury Geralds, and a med tech, Dan Dun, involved in his mental health care. He has had recent lab work done through his workplace, which showed stable electrolytes, a GFR of 93, fasting glucose of 79, LDL of 51, total cholesterol of 132, HDL of 64, TSH of 0.59, and vitamin D of 41. He has not had a recent prostate screening or hepatitis C test.       Past Surgical History:  Procedure Laterality Date   CARDIAC CATHETERIZATION     HEMORRHOID SURGERY     HEMORRHOID SURGERY N/A 09/13/2014   Procedure: HEMORRHOIDECTOMY;  Surgeon: Hortensia Ma, MD;  Location: [facility] ORS;  Service: General;  Laterality: N/A;    Outpatient Medications Prior to Visit  Medication Sig Dispense Refill   escitalopram  (LEXAPRO ) 20 MG tablet Take 20 mg by mouth daily.     mirtazapine  (REMERON ) 7.5 MG tablet Take 1 tablet (7.5 mg total) by mouth at bedtime. 60 tablet 1   allopurinol  (ZYLOPRIM ) 100 MG tablet Take 1 tablet (100 mg total) by mouth daily. 90 tablet 1   ARIPiprazole  (ABILIFY ) 5 MG tablet Take 5 mg by mouth daily.     aspirin 81 MG chewable tablet Chew by mouth.     atorvastatin (LIPITOR) 80 MG tablet Take 1 tablet by mouth at bedtime.     esomeprazole  (NEXIUM ) 20 MG capsule Take 1 capsule (20 mg total) by mouth 2 (two) times daily. 180 capsule 0    furosemide (LASIX) 20 MG tablet Take 1 tablet by mouth daily.     hydrochlorothiazide  (HYDRODIURIL ) 12.5 MG tablet Take 12.5 mg by mouth daily.     isosorbide mononitrate (IMDUR) 30 MG 24 hr tablet Take 1 tablet by mouth daily.     losartan (COZAAR) 25 MG tablet Take 0.5 tablets by mouth daily.     Melatonin 5 MG CAPS Take 1 capsule by mouth at bedtime.     ticagrelor (BRILINTA) 90 MG TABS tablet Take by mouth.     tirzepatide  (ZEPBOUND ) 15 MG/0.5ML Pen Inject 15 mg into the skin once a week. 2 mL 1   QUEtiapine  (SEROQUEL ) 25 MG tablet Take 1 tablet (25 mg total) by mouth at bedtime. May take 2 tablets ( 50 mg total) by mouth PRN 30 tablet 0   No facility-administered medications prior to visit.    Family History  Problem Relation Age of Onset   Depression Mother    Alcohol abuse Mother    Alcohol abuse Maternal Grandfather    Heart disease Maternal Grandfather 62   Alcohol abuse Paternal Grandfather    Bipolar disorder Cousin     Social History   Socioeconomic History   Marital status: Divorced    Spouse name: Not on file   Number of children: 0   Years of education: Not on file   Highest education level:  Associate degree: occupational, technical, or vocational program  Occupational History   Not on file  Tobacco Use   Smoking status: Every Day    Types: Cigars    Passive exposure: Current   Smokeless tobacco: Never   Tobacco comments:    5cigars weekly  Vaping Use   Vaping status: Never Used  Substance and Sexual Activity   Alcohol use: Yes    Comment: occassionally   Drug use: No   Sexual activity: Yes  Other Topics Concern   Not on file  Social History Narrative   Not on file   Social Drivers of Health   Financial Resource Strain: Medium Risk (05/25/2023)   Overall Financial Resource Strain (CARDIA)    Difficulty of Paying Living Expenses: Somewhat hard  Food Insecurity: No Food Insecurity (05/25/2023)   Hunger Vital Sign    Worried About Running Out of  Food in the Last Year: Never true    Ran Out of Food in the Last Year: Never true  Transportation Needs: Unmet Transportation Needs (05/25/2023)   PRAPARE - Transportation    Lack of Transportation (Medical): Yes    Lack of Transportation (Non-Medical): Yes  Physical Activity: Unknown (05/25/2023)   Exercise Vital Sign    Days of Exercise per Week: Patient declined    Minutes of Exercise per Session: Not on file  Stress: Stress Concern Present (05/25/2023)   [employer] of Occupational Health - Occupational Stress Questionnaire    Feeling of Stress : Very much  Social Connections: Moderately Isolated (05/25/2023)   Social Connection and Isolation Panel [NHANES]    Frequency of Communication with Friends and Family: Three times a week    Frequency of Social Gatherings with Friends and Family: Three times a week    Attends Religious Services: Never    Active Member of Clubs or Organizations: Yes    Attends Engineer, structural: More than 4 times per year    Marital Status: Divorced  Catering manager Violence: Not on file                                                                                                  Objective:  Physical Exam: BP 130/85 (BP Location: Left Arm, Patient Position: Sitting, Cuff Size: Large)   Pulse 85   Temp 97.9 F (36.6 C) (Temporal)   Resp 18   Wt 256 lb 8 oz (116.3 kg)   SpO2 97%   BMI 38.43 kg/m   Wt Readings from Last 3 Encounters:  09/13/23 256 lb 8 oz (116.3 kg)  05/26/23 232 lb 9.6 oz (105.5 kg)  03/23/23 222 lb (100.7 kg)   Results LABS (shown by patient on their mobile device GFR: 93 (09/05/2023) Fasting glucose: 79 mg/dL (14/78/2956) LDL: 51 mg/dL (21/30/8657) Cholesterol: 132 mg/dL (84/69/6295) HDL: 64 mg/dL (28/41/3244) TSH: 0.10 IU/mL (09/05/2023) Vitamin D: 41 ng/mL (09/05/2023) Testosterone : 355 ng/dL (27/25/3664)   Physical Exam MEASUREMENTS: Weight- 255. GENERAL: Alert, cooperative, well developed, no  acute distress HEENT: Normocephalic, normal oropharynx, moist mucous membranes CHEST: Clear to auscultation bilaterally, no wheezes, rhonchi, or crackles CARDIOVASCULAR:  Normal heart rate and rhythm, S1 and S2 normal without murmurs ABDOMEN: Soft, non-tender, non-distended, without organomegaly, normal bowel sounds EXTREMITIES: No cyanosis or edema NEUROLOGICAL: Cranial nerves grossly intact, moves all extremities without gross motor or sensory deficit     No results found.  No results found for this or any previous visit (from the past 2160 hours).      Carnell Christian, MD, MS

## 2023-09-16 ENCOUNTER — Ambulatory Visit (HOSPITAL_COMMUNITY): Admitting: Licensed Clinical Social Worker

## 2023-09-16 DIAGNOSIS — F332 Major depressive disorder, recurrent severe without psychotic features: Secondary | ICD-10-CM

## 2023-09-16 DIAGNOSIS — F4310 Post-traumatic stress disorder, unspecified: Secondary | ICD-10-CM | POA: Diagnosis not present

## 2023-09-16 NOTE — Progress Notes (Signed)
 THERAPIST PROGRESS NOTE  Session Date: 09/16/2023  Session Time: 1100 - 1200 Virtual Visit via Video Note  I connected with Zachary [name] on 09/16/23 at 11:00 AM EDT by a video enabled telemedicine application and verified that I am speaking with the correct person using two identifiers.  Location: Patient: Home Provider: Home office   I discussed the limitations of evaluation and management by telemedicine and the availability of in person appointments. The patient expressed understanding and agreed to proceed.  I discussed the assessment and treatment plan with the patient. The patient was provided an opportunity to ask questions and all were answered. The patient agreed with the plan and demonstrated an understanding of the instructions.   The patient was advised to call back or seek an in-person evaluation if the symptoms worsen or if the condition fails to improve as anticipated.  I provided 60 minutes of non-face-to-face time during this encounter.  Participation Level: Active  Behavioral Response: CasualAlertEuthymic  Type of Therapy: Individual Therapy   Treatment Goals addressed:  - Reduce frequency, intensity, and duration of depression symptoms so that daily functioning is improved (OP Depression) - Increase coping skills to manage depression and improve ability to perform daily activities (OP Depression) - Zachary [name] will identify cognitive patterns and beliefs that support depression (OP Depression) - "Improving my mindset" (OP Depression) - Zachary [name] will identify situations, thoughts, and feelings that trigger internal anger, and/or angry/aggressive actions as evidenced by self-report (Anger Management) - "Ensure it doesn't impact anyone else" (Anger Management)  ProgressTowards Goals: Progressing  Interventions: CBT, Motivational Interviewing, Supportive, and Reframing  Summary: Zachary [name] is a 53 y.o. male with past psych history of MDD and PTSD, presenting for  follow-up therapy session in efforts to improve management of depressive symptoms.   Patient actively engaged throughout session, presenting in overall pleasant moods and congruent affect throughout visit, engaging in brief check-in, detailing recounts of recent events and factors contributing to presenting moods.  Patient further engaged in reflection of recent events, noting of workweek having gone well and enjoying benefits of abilities to work from home certain days out of the week, processing patient's perspective of gratitude.  Further engaged in exploration of patient's recent increased awareness and enlightenment surrounding perspective and mindset being key factors in development, progression, maintenance, and adjustment of moods.  Patient expressed increased understanding of reframing, and challenging negative thoughts, and individual efforts at utilizing approaches throughout recent weeks.  Patient responded well to interventions. Patient continues to meet criteria for MDD and PTSD. Patient will continue to benefit from engagement in outpatient therapy due to being the least restrictive service to meet presenting needs.      08/29/2023    1:58 PM 07/26/2023    2:16 PM 07/14/2023    8:30 AM 01/14/2023    1:29 PM  GAD 7 : Generalized Anxiety Score  Nervous, Anxious, on Edge 1 0 1 1  Control/stop worrying 1 0 0 1  Worry too much - different things 0 1 0 1  Trouble relaxing 1 1 0 1  Restless 0 0 0 0  Easily annoyed or irritable 1 1 1 1   Afraid - awful might happen 0 0 0 0  Total GAD 7 Score 4 3 2 5   Anxiety Difficulty Somewhat difficult Somewhat difficult Somewhat difficult Somewhat difficult      09/13/2023    3:14 PM 08/29/2023    1:56 PM 08/09/2023    3:04 PM 07/26/2023    2:19 PM 07/14/2023  8:30 AM  Depression screen PHQ 2/9  Decreased Interest 0 1 0 1 0  Down, Depressed, Hopeless 0 0 1 1 0  PHQ - 2 Score 0 1 1 2  0  Altered sleeping  1 2 1 1   Tired, decreased energy  1 1 1 1    Change in appetite  0 0 0 1  Feeling bad or failure about yourself   0 1 1 0  Trouble concentrating  0 0 0 0  Moving slowly or fidgety/restless  0 0 0 0  Suicidal thoughts  0 0 0 0  PHQ-9 Score  3 5 5 3   Difficult doing work/chores  Somewhat difficult Not difficult at all Somewhat difficult Not difficult at all   [facility] Counselor from 07/14/2023 in [facility] from 04/21/2023 in [facility]AVIORAL [facility] PARTIAL [facility]PITALIZATION PROGRAM ED from 04/13/2023 in [facility]  C-SSRS RISK CATEGORY High Risk High Risk High Risk      Suicidal/Homicidal: Nowithout intent/plan  Therapist Response: Clinician utilized CBT, MI, Solution focused and supportive reflection techniques to address presenting challenges.   Clinician actively greeted patient upon joining virtual visit, assessing presenting affect and moods, engaging patient in introductory check-in, and further prompting patient's recounts of daily and recent events and factors contributing to presenting moods.  Actively listened to patient's reflections of recent events, utilizing open-ended questions to elicit patient's thoughts and feelings in relation to recent stressors and/or lack thereof.  Utilized Socratic questioning and engaging patient in greater critical exploration of current thoughts and historically negative thoughts, supporting patient in processing understanding of reframing, cognitive challenging techniques, and individual efforts at improving outlooks and perspectives as well as secondary gains of improved moods.  Clinician reassessed severity of presenting sxs, and presence of any safety concerns. Clinician provided support and empathy to patient during session.  Plan: Return again in 1 weeks.  Diagnosis:  Encounter Diagnoses  Name Primary?   Severe episode of recurrent major depressive disorder, without psychotic features (HCC) Yes   PTSD  (post-traumatic stress disorder)     Collaboration of Care: Other none necessary at this time.  Patient/Guardian was advised Release of Information must be obtained prior to any record release in order to collaborate their care with an outside provider. Patient/Guardian was advised if they have not already done so to contact the registration department to sign all necessary forms in order for us  to release information regarding their care.   Consent: Patient/Guardian gives verbal consent for treatment and assignment of benefits for services provided during this visit. Patient/Guardian expressed understanding and agreed to proceed.   Patsi Boots, MSW, LCSW 09/16/2023,  11:02 AM

## 2023-09-23 ENCOUNTER — Ambulatory Visit (INDEPENDENT_AMBULATORY_CARE_PROVIDER_SITE_OTHER): Admitting: Licensed Clinical Social Worker

## 2023-09-23 DIAGNOSIS — F4310 Post-traumatic stress disorder, unspecified: Secondary | ICD-10-CM | POA: Diagnosis not present

## 2023-09-23 DIAGNOSIS — F332 Major depressive disorder, recurrent severe without psychotic features: Secondary | ICD-10-CM | POA: Diagnosis not present

## 2023-09-23 NOTE — Progress Notes (Unsigned)
 THERAPIST PROGRESS NOTE  Session Date: 09/23/2023  Session Time: 1104 - 1151 Virtual Visit via Video Note  I connected with Zachary [name] on 09/23/23 at 11:00 AM EDT by a video enabled telemedicine application and verified that I am speaking with the correct person using two identifiers.  Location: Patient: Home Provider: Home office   I discussed the limitations of evaluation and management by telemedicine and the availability of in person appointments. The patient expressed understanding and agreed to proceed.  I discussed the assessment and treatment plan with the patient. The patient was provided an opportunity to ask questions and all were answered. The patient agreed with the plan and demonstrated an understanding of the instructions.   The patient was advised to call back or seek an in-person evaluation if the symptoms worsen or if the condition fails to improve as anticipated.  I provided 46 minutes of non-face-to-face time during this encounter.  Participation Level: Active  Behavioral Response: CasualAlertEuthymic  Type of Therapy: Individual Therapy   Treatment Goals addressed:  - Reduce frequency, intensity, and duration of depression symptoms so that daily functioning is improved (OP Depression) - Increase coping skills to manage depression and improve ability to perform daily activities (OP Depression) - Zachary [name] will identify cognitive patterns and beliefs that support depression (OP Depression) - "Improving my mindset" (OP Depression) - Zachary [name] will identify situations, thoughts, and feelings that trigger internal anger, and/or angry/aggressive actions as evidenced by self-report (Anger Management) - "Ensure it doesn't impact anyone else" (Anger Management)  ProgressTowards Goals: Progressing  Interventions: CBT, Motivational Interviewing, Supportive, and Reframing  Summary: Zachary [name] is a 53 y.o. male with past psych history of MDD and PTSD, presenting for  follow-up therapy session in efforts to improve management of depressive symptoms.   Patient actively engaged throughout session, presenting in overall pleasant moods and congruent affect throughout visit, engaging in brief check-in, detailing recounts of recent events and factors contributing to presenting moods, sharing of being sick currently, uncertain of cause or whether sxs are allergy related. Patient further engaged in reflection of recent events, sharing of no significant stressors over the past week. Pt further shared of enjoying working from home on Friday's when not on call rotation, and appreciating flexibility. Actively engaged in reflection of prior session areas of focus surrounding perspectives, frame of mind, and the need to retrain minds and thought processes in order to see changes in moods, further processing implications of negative moods. Actively engaged in extensive exploration of various thought patterns that prove to support/foster negative emotions or moods, utilizing 'Cognitive Distortions ' handout, processing various distorted thought patterns, results of such thought patterns, and challenges and damages over time. Further explored pt's individual experiences and understanding of cognitive distortions and the impact such distorted thought patterns/processes have had on pt.  Patient responded well to interventions. Patient continues to meet criteria for MDD and PTSD. Patient will continue to benefit from engagement in outpatient therapy due to being the least restrictive service to meet presenting needs.      08/29/2023    1:58 PM 07/26/2023    2:16 PM 07/14/2023    8:30 AM 01/14/2023    1:29 PM  GAD 7 : Generalized Anxiety Score  Nervous, Anxious, on Edge 1 0 1 1  Control/stop worrying 1 0 0 1  Worry too much - different things 0 1 0 1  Trouble relaxing 1 1 0 1  Restless 0 0 0 0  Easily annoyed or irritable 1 1 1  1  Afraid - awful might happen 0 0 0 0  Total GAD 7 Score  4 3 2 5   Anxiety Difficulty Somewhat difficult Somewhat difficult Somewhat difficult Somewhat difficult      09/13/2023    3:14 PM 08/29/2023    1:56 PM 08/09/2023    3:04 PM 07/26/2023    2:19 PM 07/14/2023    8:30 AM  Depression screen PHQ 2/9  Decreased Interest 0 1 0 1 0  Down, Depressed, Hopeless 0 0 1 1 0  PHQ - 2 Score 0 1 1 2  0  Altered sleeping  1 2 1 1   Tired, decreased energy  1 1 1 1   Change in appetite  0 0 0 1  Feeling bad or failure about yourself   0 1 1 0  Trouble concentrating  0 0 0 0  Moving slowly or fidgety/restless  0 0 0 0  Suicidal thoughts  0 0 0 0  PHQ-9 Score  3 5 5 3   Difficult doing work/chores  Somewhat difficult Not difficult at all Somewhat difficult Not difficult at all   Zachary [name] from 07/14/2023 in [facility] from 04/21/2023 in BEHAVIORAL HEALTH PARTIAL HOSPITALIZATION PROGRAM ED from 04/13/2023 in [facility]  C-SSRS RISK CATEGORY High Risk High Risk High Risk      Suicidal/Homicidal: Nowithout intent/plan  Therapist Response: Clinician utilized CBT, MI, Solution focused and supportive reflection techniques to address presenting challenges.   Clinician actively greeted patient upon joining virtual visit, assessing presenting moods and affect, actively engaging in introductory check-in, and further eliciting patient's recounts of recent and planned events, and factors contributing to presenting moods. Actively listened to patient's reflections of recent week, utilizing open-ended questions to elicit patient's recent observations of challenges with depressive sxs, supporting and validating pt's awareness and individual efforts at managing sxs. Further engaged pt in reflection of prior visit content, processing the importance of perspectives and frame of mind in resulting moods. Utilized 'Cognitive Distortions' handout, providing pt electronic copy, to support  in greater exploration of implications of negative, irrational thought patterns on overall moods.   Clinician reassessed severity of presenting sxs, and presence of any safety concerns. Clinician provided support and empathy to patient during session.  Plan: Return again in 1 weeks.  Diagnosis:  Encounter Diagnoses  Name Primary?   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  Patient/Guardian was advised Release of Information must be obtained prior to any record release in order to collaborate their care with an outside provider. Patient/Guardian was advised if they have not already done so to contact the registration department to sign all necessary forms in order for us  to release information regarding their care.   Consent: Patient/Guardian gives verbal consent for treatment and assignment of benefits for services provided during this visit. Patient/Guardian expressed understanding and agreed to proceed.   Patsi Boots, MSW, LCSW 09/23/2023,  11:58 AM

## 2023-09-26 ENCOUNTER — Encounter: Payer: Self-pay | Admitting: Family Medicine

## 2023-09-27 ENCOUNTER — Other Ambulatory Visit: Payer: Self-pay

## 2023-09-27 DIAGNOSIS — F325 Major depressive disorder, single episode, in full remission: Secondary | ICD-10-CM

## 2023-09-27 MED ORDER — ESCITALOPRAM OXALATE 20 MG PO TABS: 20.0000 mg | ORAL_TABLET | Freq: Every day | ORAL | 0 refills | Status: DC

## 2023-09-30 ENCOUNTER — Ambulatory Visit (HOSPITAL_COMMUNITY): Admitting: Licensed Clinical Social Worker

## 2023-09-30 DIAGNOSIS — F4310 Post-traumatic stress disorder, unspecified: Secondary | ICD-10-CM | POA: Diagnosis not present

## 2023-09-30 DIAGNOSIS — F332 Major depressive disorder, recurrent severe without psychotic features: Secondary | ICD-10-CM | POA: Diagnosis not present

## 2023-09-30 NOTE — Progress Notes (Unsigned)
 THERAPIST PROGRESS NOTE  Session Date: 09/30/2023  Session Time: 1102 - 1200 Virtual Visit via Video Note  I connected with Zachary [name] on 09/30/23 at 11:00 AM EDT by a video enabled telemedicine application and verified that I am speaking with the correct person using two identifiers.  Location: Patient: Home Provider: Home office   I discussed the limitations of evaluation and management by telemedicine and the availability of in person appointments. The patient expressed understanding and agreed to proceed.  I discussed the assessment and treatment plan with the patient. The patient was provided an opportunity to ask questions and all were answered. The patient agreed with the plan and demonstrated an understanding of the instructions.   The patient was advised to call back or seek an in-person evaluation if the symptoms worsen or if the condition fails to improve as anticipated.  I provided 58 minutes of non-face-to-face time during this encounter.  Participation Level: Active  Behavioral Response: CasualAlertEuthymic  Type of Therapy: Individual Therapy   Treatment Goals addressed:  - Reduce frequency, intensity, and duration of depression symptoms so that daily functioning is improved (OP Depression) - Increase coping skills to manage depression and improve ability to perform daily activities (OP Depression) - Zachary [name] will identify cognitive patterns and beliefs that support depression (OP Depression) - "Improving my mindset" (OP Depression) - Zachary [name] will identify situations, thoughts, and feelings that trigger internal anger, and/or angry/aggressive actions as evidenced by self-report (Anger Management) - "Ensure it doesn't impact anyone else" (Anger Management)  ProgressTowards Goals: Progressing  Interventions: CBT, Motivational Interviewing, Supportive, and Reframing  Summary: Zachary [name] is a 53 y.o. male with past psych history of MDD and PTSD, presenting for  follow-up therapy session in efforts to improve management of depressive symptoms.   Patient actively engaged throughout session, presenting in overall pleasant moods and congruent affect throughout visit, engaging in brief check-in, detailing recounts of recent events and factors contributing to presenting moods, sharing of having a busy morning with having new dryer dropped off this morning. Engaged in reassessing depressive and anxious sxs via PHQ-9 and GAD-7, exploring minimal increase in observed anxious sxs surround needing to increase VA PTSD rating, finding self frustrated with having to go through all additional steps to ensure necessary gov docs are completed. Utilizing support from veteran support group to seek support, feedback, and validity. Processed recent increase   Patient responded well to interventions. Patient continues to meet criteria for MDD and PTSD. Patient will continue to benefit from engagement in outpatient therapy due to being the least restrictive service to meet presenting needs.      09/30/2023   11:07 AM 08/29/2023    1:58 PM 07/26/2023    2:16 PM 07/14/2023    8:30 AM  GAD 7 : Generalized Anxiety Score  Nervous, Anxious, on Edge 1 1 0 1  Control/stop worrying 1 1 0 0  Worry too much - different things 1 0 1 0  Trouble relaxing 1 1 1  0  Restless 0 0 0 0  Easily annoyed or irritable 1 1 1 1   Afraid - awful might happen 1 0 0 0  Total GAD 7 Score 6 4 3 2   Anxiety Difficulty Somewhat difficult Somewhat difficult Somewhat difficult Somewhat difficult      09/30/2023   11:04 AM 09/13/2023    3:14 PM 08/29/2023    1:56 PM 08/09/2023    3:04 PM 07/26/2023    2:19 PM  Depression screen PHQ 2/9  Decreased Interest  0 0 1 0 1  Down, Depressed, Hopeless 0 0 0 1 1  PHQ - 2 Score 0 0 1 1 2   Altered sleeping 1  1 2 1   Tired, decreased energy 1  1 1 1   Change in appetite 0  0 0 0  Feeling bad or failure about yourself  1  0 1 1  Trouble concentrating 1  0 0 0  Moving  slowly or fidgety/restless 0  0 0 0  Suicidal thoughts 0  0 0 0  PHQ-9 Score 4  3 5 5   Difficult doing work/chores Somewhat difficult  Somewhat difficult Not difficult at all Somewhat difficult   Flowsheet Row Counselor from 07/14/2023 in [facility] from 04/21/2023 in [facility]GRAM ED from 04/13/2023 in [facility]  C-SSRS RISK CATEGORY High Risk High Risk High Risk      Suicidal/Homicidal: Nowithout intent/plan  Therapist Response: Clinician utilized CBT, MI, Solution focused and supportive reflection techniques to address presenting challenges.   Clinician actively greeted patient upon joining virtual visit, assessing presenting moods and affect, actively engaging in introductory check-in, and further eliciting patient's recounts of recent and planned events, and factors contributing to presenting moods. Actively listened to patient's reflections of recent week, utilizing open-ended questions to elicit patient's recent observations of challenges with depressive sxs, supporting and validating pt's awareness and individual efforts at managing sxs. Further engaged pt in reflection of prior visit content, processing the importance of perspectives and frame of mind in resulting moods. Utilized 'Cognitive Distortions' handout, providing pt electronic copy, to support in greater exploration of implications of negative, irrational thought patterns on overall moods.   Clinician reassessed severity of presenting sxs, and presence of any safety concerns. Clinician provided support and empathy to patient during session.  Plan: Return again in 1 weeks.  Diagnosis:  No diagnosis found.    Collaboration of Care: Other none necessary at this time.  Patient/Guardian was advised Release of Information must be obtained prior to any record release in order to collaborate their care with an  outside provider. Patient/Guardian was advised if they have not already done so to contact the registration department to sign all necessary forms in order for us  to release information regarding their care.   Consent: Patient/Guardian gives verbal consent for treatment and assignment of benefits for services provided during this visit. Patient/Guardian expressed understanding and agreed to proceed.   Patsi Boots, MSW, LCSW 09/30/2023,  11:08 AM

## 2023-10-07 ENCOUNTER — Ambulatory Visit (HOSPITAL_COMMUNITY): Admitting: Licensed Clinical Social Worker

## 2023-10-07 DIAGNOSIS — F4310 Post-traumatic stress disorder, unspecified: Secondary | ICD-10-CM

## 2023-10-07 DIAGNOSIS — F332 Major depressive disorder, recurrent severe without psychotic features: Secondary | ICD-10-CM

## 2023-10-07 NOTE — Progress Notes (Signed)
 THERAPIST PROGRESS NOTE  Session Date: 10/07/2023  Session Time: 0805 - 0903 Virtual Visit via Video Note  I connected with Sabino Crafts on 10/07/23 at 11:05 AM EDT by a video enabled telemedicine application and verified that I am speaking with the correct person using two identifiers.  Location: Patient: Work office Provider: Home office   I discussed the limitations of evaluation and management by telemedicine and the availability of in person appointments. The patient expressed understanding and agreed to proceed.  I discussed the assessment and treatment plan with the patient. The patient was provided an opportunity to ask questions and all were answered. The patient agreed with the plan and demonstrated an understanding of the instructions.   The patient was advised to call back or seek an in-person evaluation if the symptoms worsen or if the condition fails to improve as anticipated.  I provided 58 minutes of non-face-to-face time during this encounter.  Participation Level: Active  Behavioral Response: CasualAlertDepressed and Hopeless  Type of Therapy: Individual Therapy   Treatment Goals addressed:  - Reduce frequency, intensity, and duration of depression symptoms so that daily functioning is improved (OP Depression) - Increase coping skills to manage depression and improve ability to perform daily activities (OP Depression) - Chidiebere will identify cognitive patterns and beliefs that support depression (OP Depression) - "Improving my mindset" (OP Depression) - Benyamin will identify situations, thoughts, and feelings that trigger internal anger, and/or angry/aggressive actions as evidenced by self-report (Anger Management) - "Ensure it doesn't impact anyone else" (Anger Management)  ProgressTowards Goals: Not Progressing  Interventions: CBT, Motivational Interviewing, Supportive, and Reframing  Summary: Dewayne is a 53 y.o. male with past psych history of MDD and  PTSD, presenting for follow-up therapy session in efforts to improve management of depressive symptoms.   Patient actively engaged throughout session, presenting in overall depressed moods and congruent affect throughout visit, engaging in check-in, sharing of things having "been better", providing details of finding out sub-flooring in bathroom has rotted and unable to be covered by home owners insurance, utilization of support group in various capacities, and challenging interactions with best friend resulting in ending of relationship of 27 years. Processed interactions with friendship, further exploring letter sent to friend, patient's individual perspective, irrational negative thoughts, and patient's history of assuming majority of blame and negative situations or encounters within most relationships.  Further processed patient's feelings surrounding the best functioning relationships pt has are already broken/damaged, citing relationships with ex-wife, and parents.  Briefly revisited anger iceberg to support patient and reflection of experienced anger within relationships primarily being as a result of underlying feelings.  Patient denied any current risk, program optimistic and acknowledging he is able to establish and maintain successful relationships.  Patient responded well to interventions. Patient continues to meet criteria for MDD and PTSD. Patient will continue to benefit from engagement in outpatient therapy due to being the least restrictive service to meet presenting needs.      09/30/2023   11:07 AM 08/29/2023    1:58 PM 07/26/2023    2:16 PM 07/14/2023    8:30 AM  GAD 7 : Generalized Anxiety Score  Nervous, Anxious, on Edge 1 1 0 1  Control/stop worrying 1 1 0 0  Worry too much - different things 1 0 1 0  Trouble relaxing 1 1 1  0  Restless 0 0 0 0  Easily annoyed or irritable 1 1 1 1   Afraid - awful might happen 1 0 0 0  Total  GAD 7 Score 6 4 3 2   Anxiety Difficulty Somewhat  difficult Somewhat difficult Somewhat difficult Somewhat difficult      09/30/2023   11:04 AM 09/13/2023    3:14 PM 08/29/2023    1:56 PM 08/09/2023    3:04 PM 07/26/2023    2:19 PM  Depression screen PHQ 2/9  Decreased Interest 0 0 1 0 1  Down, Depressed, Hopeless 0 0 0 1 1  PHQ - 2 Score 0 0 1 1 2   Altered sleeping 1  1 2 1   Tired, decreased energy 1  1 1 1   Change in appetite 0  0 0 0  Feeling bad or failure about yourself  1  0 1 1  Trouble concentrating 1  0 0 0  Moving slowly or fidgety/restless 0  0 0 0  Suicidal thoughts 0  0 0 0  PHQ-9 Score 4  3 5 5   Difficult doing work/chores Somewhat difficult  Somewhat difficult Not difficult at all Somewhat difficult   Flowsheet Row Counselor from 07/14/2023 in [facility] from 04/21/2023 in [facility]AL [facility]PITAL[facility] PROGRAM ED from 04/13/2023 in [facility]  C-SSRS RISK CATEGORY High Risk High Risk High Risk      Suicidal/Homicidal: Nowithout intent/plan  Therapist Response: Clinician utilized CBT, MI, Solution focused and supportive reflection techniques to address presenting challenges.   Clinician actively greeted patient upon joining today's virtual visit, actively engaging in introductory check-in, assessing presenting moods and affect, prompting patient's engagement in reflection of recent events and factors contributing to presenting moods.  Actively listened to patient's recounts of past week and challenges experienced in relation to home needs and interactions with long-term friend.  Utilized psychoeducational, CBT, and MI interventions to support patient in processing irrational negative thoughts, negative perspectives, thoughts and feelings surrounding challenging interactions, and individual recent progressions in improved perspectives and outlook. Utilized MI interventions to aid pt in exploration of evidence to support contrary  evidence to irrational negative thoughts.  Clinician reassessed severity of presenting sxs, and presence of any safety concerns. Clinician provided support and empathy to patient during session.  Plan: Return again in 1 weeks.  Diagnosis:  Encounter Diagnoses  Name Primary?   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  Patient/Guardian was advised Release of Information must be obtained prior to any record release in order to collaborate their care with an outside provider. Patient/Guardian was advised if they have not already done so to contact the registration department to sign all necessary forms in order for us  to release information regarding their care.   Consent: Patient/Guardian gives verbal consent for treatment and assignment of benefits for services provided during this visit. Patient/Guardian expressed understanding and agreed to proceed.   Patsi Boots, MSW, LCSW 10/07/2023,  8:07 AM

## 2023-10-14 ENCOUNTER — Ambulatory Visit (INDEPENDENT_AMBULATORY_CARE_PROVIDER_SITE_OTHER): Admitting: Licensed Clinical Social Worker

## 2023-10-14 DIAGNOSIS — F4310 Post-traumatic stress disorder, unspecified: Secondary | ICD-10-CM

## 2023-10-14 DIAGNOSIS — F332 Major depressive disorder, recurrent severe without psychotic features: Secondary | ICD-10-CM

## 2023-10-14 NOTE — Progress Notes (Unsigned)
 THERAPIST PROGRESS NOTE  Session Date: 10/14/2023  Session Time: 1610 - 0901 Virtual Visit via Video Note  I connected with Zachary [name] on 10/14/23 at  8:00 AM EDT by a video enabled telemedicine application and verified that I am speaking with the correct person using two identifiers.  Location: Patient: Home Provider: Home Office   I discussed the limitations of evaluation and management by telemedicine and the availability of in person appointments. The patient expressed understanding and agreed to proceed.  The patient was advised to call back or seek an in-person evaluation if the symptoms worsen or if the condition fails to improve as anticipated.  I provided 56 minutes of non-face-to-face time during this encounter.  Participation Level: Active  Behavioral Response: CasualAlertDepressed and Hopeless  Type of Therapy: Individual Therapy   Treatment Goals addressed: - Reduce frequency, intensity, and duration of depression symptoms so that daily functioning is improved (OP Depression) - Increase coping skills to manage depression and improve ability to perform daily activities (OP Depression) - Antinio will identify cognitive patterns and beliefs that support depression (OP Depression) - Improving my mindset (OP Depression) - Riel will identify situations, thoughts, and feelings that trigger internal anger, and/or angry/aggressive actions as evidenced by self-report (Anger Management) - Ensure it doesn't impact anyone else (Anger Management)  ProgressTowards Goals: Progressing  Interventions: CBT, Motivational Interviewing, Supportive, and Reframing  Summary: Zachary [name] is a 53 y.o. male with past psych history of MDD and PTSD, presenting for follow-up therapy session in efforts to improve management of depressive symptoms.   Patient actively engaged throughout session, presenting in overall depressed moods and congruent affect throughout visit, engaging in  check-in, sharing of things having been okay, utilized peers within veteran support group. Explored presenting stressors including bathroom sub-flooring, VA disability determination, and ending of relationship with close friend, processing individual thoughts, feelings, and perspectives in relation to navigation of presenting stressors, identifying efforts to address concerns in smaller more manageable tasks. Extensively explored pt's efforts at physical exercise to support in the improvement of physical, mental, and emotional health as well as chronic sleep issues, feeling increased exercise would support in improvement of sxs. Processed pt's continued awareness of individual challenges navigating stressors, efforts at improving management of anger and irritability, and accepting what is/is not within pt's control regarding relationships with others, and worldly stressors, and challenges.   Patient responded well to interventions. Patient continues to meet criteria for MDD and PTSD. Patient will continue to benefit from engagement in outpatient therapy due to being the least restrictive service to meet presenting needs.      09/30/2023   11:07 AM 08/29/2023    1:58 PM 07/26/2023    2:16 PM 07/14/2023    8:30 AM  GAD 7 : Generalized Anxiety Score  Nervous, Anxious, on Edge 1 1 0 1  Control/stop worrying 1 1 0 0  Worry too much - different things 1 0 1 0  Trouble relaxing 1 1 1  0  Restless 0 0 0 0  Easily annoyed or irritable 1 1 1 1   Afraid - awful might happen 1 0 0 0  Total GAD 7 Score 6 4 3 2   Anxiety Difficulty Somewhat difficult Somewhat difficult Somewhat difficult Somewhat difficult      09/30/2023   11:04 AM 09/13/2023    3:14 PM 08/29/2023    1:56 PM 08/09/2023    3:04 PM 07/26/2023    2:19 PM  Depression screen PHQ 2/9  Decreased Interest 0 0  1 0 1  Down, Depressed, Hopeless 0 0 0 1 1  PHQ - 2 Score 0 0 1 1 2   Altered sleeping 1  1 2 1   Tired, decreased energy 1  1 1 1   Change in  appetite 0  0 0 0  Feeling bad or failure about yourself  1  0 1 1  Trouble concentrating 1  0 0 0  Moving slowly or fidgety/restless 0  0 0 0  Suicidal thoughts 0  0 0 0  PHQ-9 Score 4  3 5 5   Difficult doing work/chores Somewhat difficult  Somewhat difficult Not difficult at all Somewhat difficult   Flowsheet Row Counselor from 07/14/2023 in [facility] from 04/21/2023 in BE[facility]AL [facility]PITAL[facility] PROGRAM ED from 04/13/2023 in [facility]  C-SSRS RISK CATEGORY High Risk High Risk High Risk   Suicidal/Homicidal: Nowithout intent/plan  Therapist Response: Clinician utilized CBT, MI, Solution focused and supportive reflection techniques to address presenting challenges.   Clinician actively greeted patient upon joining today's scheduled virtual visit, engaging in introductory check-in, assessing presenting moods and affect, and evoking patient's recounts of daily, and weekly events, and factors contributing to presenting moods.  Actively listened to patient's recounts of morning and weekly events, providing support and validation of patient's expressed continued thoughts and feelings surrounding presenting stressors. Provided support and validation of pt's expressed thoughts, feelings, and perspectives in relation to presenting sxs, and individual efforts at managing challenges. Utilized open ended questions to support pt in further exploring events, utilization of alternate supports, and feelings surrounding received support. Utilized socratic questioning to prompt greater critical processing of outlooks and perspectives in relation to overall interactions with others and worldly stressors.  Clinician reassessed severity of presenting sxs, and presence of any safety concerns. Clinician provided support and empathy to patient during session.  Plan: Return again in 1 weeks.  Diagnosis:  Encounter  Diagnoses  Name Primary?   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  Patient/Guardian was advised Release of Information must be obtained prior to any record release in order to collaborate their care with an outside provider. Patient/Guardian was advised if they have not already done so to contact the registration department to sign all necessary forms in order for us  to release information regarding their care.   Consent: Patient/Guardian gives verbal consent for treatment and assignment of benefits for services provided during this visit. Patient/Guardian expressed understanding and agreed to proceed.   Patsi Boots, MSW, LCSW 10/14/2023,  8:05 AM

## 2023-10-21 ENCOUNTER — Ambulatory Visit (HOSPITAL_COMMUNITY): Admitting: Licensed Clinical Social Worker

## 2023-10-21 DIAGNOSIS — F4310 Post-traumatic stress disorder, unspecified: Secondary | ICD-10-CM | POA: Diagnosis not present

## 2023-10-21 DIAGNOSIS — F332 Major depressive disorder, recurrent severe without psychotic features: Secondary | ICD-10-CM | POA: Diagnosis not present

## 2023-10-21 NOTE — Progress Notes (Unsigned)
 THERAPIST PROGRESS NOTE  Session Date: 10/21/2023  Session Time: 0804 - 0909 Virtual Visit via Video Note  I connected with Zachary [name] on 10/21/23 at  8:00 AM EDT by a video enabled telemedicine application and verified that I am speaking with the correct person using two identifiers.  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 The patient expressed understanding and agreed to proceed.  The patient was advised to call back or seek an in-person evaluation if the symptoms worsen or if the condition fails to improve as anticipated.  I provided 65 minutes of non-face-to-face time during this encounter.  Participation Level: Active  Behavioral Response: CasualAlertDepressed and Hopeless  Type of Therapy: Individual Therapy   Treatment Goals addressed: - Reduce frequency, intensity, and duration of depression symptoms so that daily functioning is improved (OP Depression) - Increase coping skills to manage depression and improve ability to perform daily activities (OP Depression) - Zachary [name] will identify cognitive patterns and beliefs that support depression (OP Depression) - Improving my mindset (OP Depression) - Zachary [name] will identify situations, thoughts, and feelings that trigger internal anger, and/or angry/aggressive actions as evidenced by self-report (Anger Management) - Ensure it doesn't impact anyone else (Anger Management)  ProgressTowards Goals: Progressing  Interventions: CBT, Motivational Interviewing, Supportive, and Reframing  Summary: Zachary [name] is a 53 y.o. male with past psych history of MDD and PTSD, presenting for follow-up therapy session in efforts to improve management of depressive symptoms.   Patient actively engaged throughout session, presenting in overall depressed moods and congruent affect throughout visit, engaging in  check-in, sharing I'm tired, been a busy week, been on call and had a lot of technical things to resolve, upcoming annual company event tomorrow. Processed how pt proves to have been navigating weekly stress, taking things one step at a time. Explored increased sleep difficulties throughout the week. Processed utilization of support system, sharing of having gone to cigar lounge to relax last night and connected with support group members. Explored individual efforts at reconnecting with close friend, having come to realization friend will not reach out to resolve/address damages and inabilities for relationship to be repaired. Shared of having discussion, giving advice which peer did not take, but did give same advice to anther individual dealing with a similar issue, proving to not utilize advice himself.   Patient responded well to interventions. Patient continues to meet criteria for MDD and PTSD. Patient will continue to benefit from engagement in outpatient therapy due to being the least restrictive service to meet presenting needs.      09/30/2023   11:07 AM 08/29/2023    1:58 PM 07/26/2023    2:16 PM 07/14/2023    8:30 AM  GAD 7 : Generalized Anxiety Score  Nervous, Anxious, on Edge 1 1 0 1  Control/stop worrying 1 1 0 0  Worry too much - different things 1 0 1 0  Trouble relaxing 1 1 1  0  Restless 0 0 0 0  Easily annoyed or irritable 1 1 1 1   Afraid - awful might happen 1 0 0 0  Total GAD 7 Score 6 4 3 2   Anxiety Difficulty Somewhat difficult Somewhat difficult Somewhat difficult Somewhat difficult      09/30/2023   11:04 AM 09/13/2023    3:14 PM 08/29/2023    1:56 PM 08/09/2023    3:04 PM 07/26/2023    2:19 PM  Depression screen PHQ 2/9  Decreased Interest 0 0 1 0 1  Down, Depressed, Hopeless 0 0 0 1 1  PHQ - 2 Score 0 0 1 1 2   Altered sleeping 1  1 2 1   Tired, decreased energy 1  1 1 1   Change in appetite 0  0 0 0  Feeling bad or failure about yourself  1  0 1 1  Trouble  concentrating 1  0 0 0  Moving slowly or fidgety/restless 0  0 0 0  Suicidal thoughts 0  0 0 0  PHQ-9 Score 4  3 5 5   Difficult doing work/chores Somewhat difficult  Somewhat difficult Not difficult at all Somewhat difficult   Flowsheet Row Counselor from 07/14/2023 in [facility] from 04/21/2023 in [facility]GRAM ED from 04/13/2023 in [facility]  C-SSRS RISK CATEGORY High Risk High Risk High Risk   Suicidal/Homicidal: Nowithout intent/plan  Therapist Response: Clinician utilized CBT, MI, Solution focused and supportive reflection techniques to address presenting challenges.   Clinician actively greeted patient upon joining today's scheduled virtual visit, engaging in introductory check-in, assessing presenting moods and affect, and evoking patient's recounts of daily, and weekly events, and factors contributing to presenting moods.  Actively listened to patient's recounts of morning and weekly events, providing support and validation of patient's expressed continued thoughts and feelings surrounding presenting stressors. Provided support and validation of pt's expressed thoughts, feelings, and perspectives in relation to presenting sxs, and individual efforts at managing challenges. Utilized open ended questions to support pt in further exploring events, utilization of alternate supports, and feelings surrounding received support. Utilized socratic questioning to prompt greater critical processing of outlooks and perspectives in relation to overall interactions with others and worldly stressors.  Clinician reassessed severity of presenting sxs, and presence of any safety concerns. Clinician provided support and empathy to patient during session.  Plan: Return again in 1 weeks.  Diagnosis:  Encounter Diagnoses  Name Primary?   Severe episode of recurrent major depressive  disorder, without psychotic features (HCC) Yes   PTSD (post-traumatic stress disorder)     Collaboration of Care: Other none necessary at this time.  Patient/Guardian was advised Release of Information must be obtained prior to any record release in order to collaborate their care with an outside provider. Patient/Guardian was advised if they have not already done so to contact the registration department to sign all necessary forms in order for us  to release information regarding their care.   Consent: Patient/Guardian gives verbal consent for treatment and assignment of benefits for services provided during this visit. Patient/Guardian expressed understanding and agreed to proceed.   Patsi Boots, MSW, LCSW 10/21/2023,  8:05 AM

## 2023-10-28 ENCOUNTER — Ambulatory Visit (HOSPITAL_COMMUNITY): Admitting: Licensed Clinical Social Worker

## 2023-10-28 DIAGNOSIS — F332 Major depressive disorder, recurrent severe without psychotic features: Secondary | ICD-10-CM

## 2023-10-28 DIAGNOSIS — F4310 Post-traumatic stress disorder, unspecified: Secondary | ICD-10-CM

## 2023-10-28 NOTE — Progress Notes (Unsigned)
 THERAPIST PROGRESS NOTE  Session Date: 10/28/2023  Session Time: 0905 - 9046 Virtual Visit via Video Note  I connected with Zachary [name] on 10/28/23 at  9:00 AM EDT by a video enabled telemedicine application and verified that I am speaking with the correct person using two identifiers.  Location: Patient: Home Provider: Home Office   I discussed the limitations of evaluation and management by telemedicine and the availability of in person appointments. The patient expressed understanding and agreed to proceed.  The patient was advised to call back or seek an in-person evaluation if the symptoms worsen or if the condition fails to improve as anticipated.  I provided 48 minutes of non-face-to-face time during this encounter.  Participation Level: Active  Behavioral Response: CasualAlertDysphoric  Type of Therapy: Individual Therapy   Treatment Goals addressed: - Reduce frequency, intensity, and duration of depression symptoms so that daily functioning is improved (OP Depression) - Increase coping skills to manage depression and improve ability to perform daily activities (OP Depression) - Jerard will identify cognitive patterns and beliefs that support depression (OP Depression) - Improving my mindset (OP Depression) - [name] will identify situations, thoughts, and feelings that trigger internal anger, and/or angry/aggressive actions as evidenced by self-report (Anger Management) - Ensure it doesn't impact anyone else (Anger Management)  ProgressTowards Goals: Progressing  Interventions: CBT, Motivational Interviewing, Supportive, and Reframing  Summary: Zachary [name] is a 53 y.o. male with past psych history of MDD and PTSD, presenting for follow-up therapy session in efforts to improve management of depressive symptoms.   Patient actively engaged throughout session, presenting in overall depressed moods and congruent affect throughout visit, engaging in check-in, sharing  I'm off today because I worked all week last week and covered the event on Saturday, seemed to go smoothly. Reassessed presenting depressive and anxious sxs via PHQ-9 and GAD-7, further processing observed sxs and impact. Further processed anxious sxs, noticing increased anx with ending of friendship, navigating that world, still having to exist with friend in the club they're in together, sometimes feels exhausting. Shared of having not been dealing with ending of friendship well, having not engaged in social interactions with club due to feeling uncomfortable. Reflected on recent depressive sxs, having experienced brain chatter, and negative thoughts have increased. Reflected further on separation in 2014 and feeling of having been in major depressive episode since, feeling this to be the norm, but exhausting. Processed how pt has proven to manage difficulties over recent week(s), having written down a lot of thoughts in journal, finding trends in abilities to have surface level relationships but experiencing challenges in relationship of substance. Further shared of considerations for leaving leadership of group/club, having connected with group leaders, and friend, to explore challenges. Actively reflected on challenges and negative thoughts, challenging negative thoughts and acknowledging pt's own awareness of irrational thoughts.  Patient responded well to interventions. Patient continues to meet criteria for MDD and PTSD. Patient will continue to benefit from engagement in outpatient therapy due to being the least restrictive service to meet presenting needs.      10/28/2023    9:10 AM 09/30/2023   11:07 AM 08/29/2023    1:58 PM 07/26/2023    2:16 PM  GAD 7 : Generalized Anxiety Score  Nervous, Anxious, on Edge 1 1 1  0  Control/stop worrying 1 1 1  0  Worry too much - different things 1 1 0 1  Trouble relaxing 1 1 1 1   Restless 0 0 0 0  Easily annoyed or  irritable 1 1 1 1   Afraid - awful  might happen 0 1 0 0  Total GAD 7 Score 5 6 4 3   Anxiety Difficulty Very difficult Somewhat difficult Somewhat difficult Somewhat difficult      10/28/2023    9:12 AM 09/30/2023   11:04 AM 09/13/2023    3:14 PM 08/29/2023    1:56 PM 08/09/2023    3:04 PM  Depression screen PHQ 2/9  Decreased Interest 1 0 0 1 0  Down, Depressed, Hopeless 1 0 0 0 1  PHQ - 2 Score 2 0 0 1 1  Altered sleeping 1 1  1 2   Tired, decreased energy 2 1  1 1   Change in appetite 0 0  0 0  Feeling bad or failure about yourself  1 1  0 1  Trouble concentrating 1 1  0 0  Moving slowly or fidgety/restless 0 0  0 0  Suicidal thoughts 0 0  0 0  PHQ-9 Score 7 4  3 5   Difficult doing work/chores Somewhat difficult Somewhat difficult  Somewhat difficult Not difficult at all   [employer] from 07/14/2023 in [facility] from 04/21/2023 in BEHAVIORAL [facility]IAL [facility]PITAL[facility] PROGRAM ED from 04/13/2023 in [facility]  C-SSRS RISK CATEGORY High Risk High Risk High Risk   Suicidal/Homicidal: Nowithout intent/plan  Therapist Response: Clinician utilized CBT, MI, Solution focused and supportive reflection techniques to address presenting challenges.   Clinician actively greeted pt upon joining virtual visit, engaging in introductory check-in, assessing presenting moods and affect, and eliciting pt's reflections of morning and recent events and factors contributing to presentation. Actively listened to pt's recounts of recent weekly events and observed reduction in work related stress with having a long weekend. Actively listened to pt's reflections of continued challenges surrounding relationship with long-time friend having ended and the additional challenges this presents for pt with maintaining role within group. Provided validation and support for pt's expressed thoughts and feelings surrounding ongoing challenges, utilizing socratic  questioning to further aid pt in greater critical processing of thoughts, exploring irrational thoughts, and identifying behavioral activation methods to aid pt in exploring activities and events that support in increasing happiness.  Clinician reassessed severity of presenting sxs, and presence of any safety concerns. Clinician provided support and empathy to patient during session.  Homework: Ship broker, begin exploration of individual activities pt find enjoyment in.  Plan: Return again in 1 weeks.  Diagnosis:  Encounter Diagnoses  Name Primary?   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  Patient/Guardian was advised Release of Information must be obtained prior to any record release in order to collaborate their care with an outside provider. Patient/Guardian was advised if they have not already done so to contact the registration department to sign all necessary forms in order for us  to release information regarding their care.   Consent: Patient/Guardian gives verbal consent for treatment and assignment of benefits for services provided during this visit. Patient/Guardian expressed understanding and agreed to proceed.   Lynwood JONETTA Maris, MSW, LCSW 10/28/2023,  9:14 AM

## 2023-10-30 ENCOUNTER — Other Ambulatory Visit: Payer: Self-pay | Admitting: Family Medicine

## 2023-10-30 DIAGNOSIS — F325 Major depressive disorder, single episode, in full remission: Secondary | ICD-10-CM

## 2023-11-11 ENCOUNTER — Ambulatory Visit (INDEPENDENT_AMBULATORY_CARE_PROVIDER_SITE_OTHER): Admitting: Licensed Clinical Social Worker

## 2023-11-11 DIAGNOSIS — F4310 Post-traumatic stress disorder, unspecified: Secondary | ICD-10-CM

## 2023-11-11 DIAGNOSIS — F332 Major depressive disorder, recurrent severe without psychotic features: Secondary | ICD-10-CM | POA: Diagnosis not present

## 2023-11-11 NOTE — Progress Notes (Unsigned)
 THERAPIST PROGRESS NOTE  Session Date: 11/11/2023  Session Time: 0904 - 1000 Virtual Visit via Video Note  I connected with Zachary [name] on 11/11/23 at  9:00 AM EDT by a video enabled telemedicine application and verified that I am speaking with the correct person using two identifiers.  Location: Patient: Home Provider: Home Office   I discussed the limitations of evaluation and management by telemedicine and the availability of in person appointments. The patient expressed understanding and agreed to proceed.  The patient was advised to call back or seek an in-person evaluation if the symptoms worsen or if the condition fails to improve as anticipated.  I provided 56 minutes of non-face-to-face time during this encounter.  Participation Level: Active  Behavioral Response: CasualAlertDysphoric  Type of Therapy: Individual Therapy   Treatment Goals addressed: - Reduce frequency, intensity, and duration of depression symptoms so that daily functioning is improved (OP Depression) - Increase coping skills to manage depression and improve ability to perform daily activities (OP Depression) - Zachary [name] will identify cognitive patterns and beliefs that support depression (OP Depression) - Improving my mindset (OP Depression) - Zachary [name] will identify situations, thoughts, and feelings that trigger internal anger, and/or angry/aggressive actions as evidenced by self-report (Anger Management) - Ensure it doesn't impact anyone else (Anger Management)  ProgressTowards Goals: Progressing  Interventions: CBT, Motivational Interviewing, Supportive, and Reframing  Summary: Zachary [name] is a 53 y.o. male with past psych history of MDD and PTSD, presenting for follow-up therapy session in efforts to improve management of depressive symptoms.   Patient actively engaged throughout session, presenting in overall depressed moods and congruent affect throughout visit, engaging in check-in, sharing  It's going. Actively engaged in reassessing of presenting anxious and depressive sxs via GAD-7 and PHQ-9, noting of increased depressive and anxious sxs observed over past two weeks, further processing variances in scores, sharing of having not all been bad over recent weeks. Further detailing long term friend are back on speaking terms, spending time together has been nice. Recounted continued stress surrounding ongoing VA process, filing new applications, having to appeal/supplemental claims for prior denials, and having found out homeowners insurance will not cover needed repairs at home, increasing stress for pt of need to gather $20k, felt PTSD was triggered, experiencing panic attacks, reflecting and feeling overall loss of control and the unknown runs havoc in my mind, found self telling self it is what it is, and have to deal with it as it comes, utilizing self-talk techniques. 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 Began exploring trauma responses of Fight, Flight, Freeze, Flop, Fawn, and how trauma impacts pt's behaviors, with pt acknowledging abilities to reflect after PTSD episodes, finding inabilities to be aware of triggers and cues prior to, to be challenging.  Patient responded well to interventions. Patient continues to meet criteria for MDD and PTSD. Patient will continue to benefit from engagement in outpatient therapy due to being the least restrictive service to meet presenting needs.      10/28/2023    9:10 AM 09/30/2023   11:07 AM 08/29/2023    1:58 PM 07/26/2023    2:16 PM  GAD 7 : Generalized Anxiety Score  Nervous, Anxious, on Edge 1 1 1  0  Control/stop worrying 1 1 1  0  Worry too much - different things 1 1 0  1  Trouble relaxing 1 1 1 1   Restless 0 0 0 0  Easily annoyed or irritable  1 1 1 1   Afraid - awful might happen 0 1 0 0  Total GAD 7 Score 5 6 4 3   Anxiety Difficulty Very difficult Somewhat difficult Somewhat difficult Somewhat difficult      10/28/2023    9:12 AM 09/30/2023   11:04 AM 09/13/2023    3:14 PM 08/29/2023    1:56 PM 08/09/2023    3:04 PM  Depression screen PHQ 2/9  Decreased Interest 1 0 0 1 0  Down, Depressed, Hopeless 1 0 0 0 1  PHQ - 2 Score 2 0 0 1 1  Altered sleeping 1 1  1 2   Tired, decreased energy 2 1  1 1   Change in appetite 0 0  0 0  Feeling bad or failure about yourself  1 1  0 1  Trouble concentrating 1 1  0 0  Moving slowly or fidgety/restless 0 0  0 0  Suicidal thoughts 0 0  0 0  PHQ-9 Score 7 4  3 5   Difficult doing work/chores Somewhat difficult Somewhat difficult  Somewhat difficult Not difficult at all   [employer] from 07/14/2023 in [facility] from 04/21/2023 in [facility] PARTIAL [facility]PITALIZATION PROGRAM ED from 04/13/2023 in [facility]  C-SSRS RISK CATEGORY High Risk High Risk High Risk   Suicidal/Homicidal: Nowithout intent/plan  Therapist Response: Clinician utilized CBT, MI, Solution focused and supportive reflection techniques to address presenting challenges.   Clinician actively greeted pt upon joining virtual visit, assessing presenting moods and affect, and engaging in introductory check-in. Reassessed presenting depressive and anxious sxs via PHQ-9 and GAD-7, noting of continued gradual increase in depressive and anxious sxs, further eliciting pt's recounts of observed variances in sxs. Actively listened to pt's recounts of recent events, supporting pt's acknowledgment of positive events occurring over the past two weeks, and further supporting pt's reflections of presenting challenges surrounding household needed repairs and continued VA disability barriers. Utilized open ended questions to further prompt pt's  exploration of thoughts, feelings, experienced surrounding presenting stressors. Provided support and validation for pt's identified thoughts and feelings. Utilized socratic questions to further support pt in greater critical processing of thoughts and feelings related to presenting stressors and individual means of approaching challenges. Utilized CBT, MI, and psycho ed interventions to support in processing relationship between trauma and behavioral responses, individual abilities at understanding and managing stressors, and needs of identifying triggers and cues to aid in improved responses.  Clinician reassessed severity of presenting sxs, and presence of any safety concerns. Clinician provided support and empathy to patient during session.  Homework: Ship broker, begin exploration of individual activities pt find enjoyment in.  Plan: Return again in 1 weeks.  Diagnosis:  Encounter Diagnoses  Name Primary?   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  Patient/Guardian was advised Release of Information must be obtained prior to any record release in order to collaborate their care with an outside provider. Patient/Guardian was advised if they have not already done so to contact the registration department to sign all necessary forms in order for us  to release information regarding their care.   Consent: Patient/Guardian gives verbal consent for treatment and assignment of benefits for  services provided during this visit. Patient/Guardian expressed understanding and agreed to proceed.   Lynwood JONETTA Maris, MSW, LCSW 11/11/2023,  9:07 AM

## 2023-11-18 ENCOUNTER — Ambulatory Visit (INDEPENDENT_AMBULATORY_CARE_PROVIDER_SITE_OTHER): Admitting: Licensed Clinical Social Worker

## 2023-11-18 DIAGNOSIS — F332 Major depressive disorder, recurrent severe without psychotic features: Secondary | ICD-10-CM

## 2023-11-18 DIAGNOSIS — F4310 Post-traumatic stress disorder, unspecified: Secondary | ICD-10-CM | POA: Diagnosis not present

## 2023-11-18 NOTE — Progress Notes (Signed)
 THERAPIST PROGRESS NOTE  Session Date: 11/18/2023  Session Time: 0901 - 1026 Virtual Visit via Video Note  I connected with Zachary [name] Blush on 11/18/23 at  9:00 AM EDT by a video enabled telemedicine application and verified that I am speaking with the correct person using two identifiers.  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 The patient expressed understanding and agreed to proceed.  The patient was advised to call back or seek an in-person evaluation if the symptoms worsen or if the condition fails to improve as anticipated.  I provided 85 minutes of non-face-to-face time during this encounter.  Participation Level: Active  Behavioral Response: CasualAlertDysphoric and Euthymic  Type of Therapy: Individual Therapy   Treatment Goals addressed: - Reduce frequency, intensity, and duration of depression symptoms so that daily functioning is improved (OP Depression) - Increase coping skills to manage depression and improve ability to perform daily activities (OP Depression) - [place] will identify cognitive patterns and beliefs that support depression (OP Depression) - Improving my mindset (OP Depression) - [name] will identify situations, thoughts, and feelings that trigger internal anger, and/or angry/aggressive actions as evidenced by self-report (Anger Management) - Ensure it doesn't impact anyone else (Anger Management)  ProgressTowards Goals: Progressing  Interventions: CBT, Motivational Interviewing, Supportive, and Reframing  Summary: Zachary [name] is a 53 y.o. male with past psych history of MDD and PTSD, presenting for follow-up therapy session in efforts to improve management of depressive symptoms.   Patient actively engaged throughout session, presenting in variable moods, primarily dysphoric with brightening in periods, and  congruent affect throughout visit, engaging in check-in, sharing I'm just really tired this week, further detailing of having not been sleeping well this week, sharing of knee and hip hurting proving to impact sleep, and not seeing a lot of hope in the world as a whole. Pt further shared of having reflected on comparing his life and his father's life, noting of father being likeable but having limited empathy for others, and believing father to have had a good life versus feeling himself to have not. Further processed individuals exposure to news and the implications on depression, anxiety, and moods as a whole, noting of individuals whom have limited exposure to worldly stressors, appearing lighter in nature and less down/depressed.  Further explored patient's thoughts, feelings, and perspectives surrounding immediate exposure and impact on depressive and anxious symptoms, as well as patient further processing individual perspectives on religion, politics, and how these to prove to impact level of hope surrounding worldly views and stressors.  Explored thoughts surrounding reduction in media exposure to in relation to potential support and reduction of depressive and anxious symptoms.  Patient responded well to interventions. Patient continues to meet criteria for MDD and PTSD. Patient will continue to benefit from engagement in outpatient therapy due to being the least restrictive service to meet presenting needs.      11/11/2023    9:09 AM 10/28/2023    9:10 AM 09/30/2023   11:07 AM 08/29/2023    1:58 PM  GAD 7 : Generalized Anxiety Score  Nervous, Anxious, on Edge 1 1 1 1   Control/stop worrying 1 1 1 1   Worry too much - different things 1 1 1  0  Trouble relaxing 2 1 1 1   Restless 1 0 0 0  Easily annoyed or irritable 1 1 1 1   Afraid - awful might happen 1 0  1 0  Total GAD 7 Score 8 5 6 4   Anxiety Difficulty Somewhat difficult Very difficult Somewhat difficult Somewhat difficult      11/11/2023     9:07 AM 10/28/2023    9:12 AM 09/30/2023   11:04 AM 09/13/2023    3:14 PM 08/29/2023    1:56 PM  Depression screen PHQ 2/9  Decreased Interest 1 1 0 0 1  Down, Depressed, Hopeless 1 1 0 0 0  PHQ - 2 Score 2 2 0 0 1  Altered sleeping 2 1 1  1   Tired, decreased energy 2 2 1  1   Change in appetite 0 0 0  0  Feeling bad or failure about yourself  1 1 1   0  Trouble concentrating 1 1 1   0  Moving slowly or fidgety/restless 0 0 0  0  Suicidal thoughts 0 0 0  0  PHQ-9 Score 8 7 4  3   Difficult doing work/chores Very difficult Somewhat difficult Somewhat difficult  Somewhat difficult   Flowsheet Row Counselor from 07/14/2023 in [facility] from 04/21/2023 in [facility] ED from 04/13/2023 in [facility]  C-SSRS RISK CATEGORY High Risk High Risk High Risk   Suicidal/Homicidal: Nowithout intent/plan  Therapist Response: Clinician utilized CBT, MI, Solution focused and supportive reflection techniques to address presenting challenges.   Clinician actively greeted pt upon joining virtual visit, assessing presenting moods and affect, and engaging in introductory check-in. Utilized open-ended prompts to engage patient in recounts of events of the past week and exploration of presenting stressors and/or challenges. Actively listened to pt's recounts of the past week, identifying patient's awareness and understanding of other individuals abilities to disconnect from media sources and the observable benefits to moods. Utilized open ended questions to further prompt pt's exploration of thoughts, feelings, experienced surrounding presenting stressors, processing patient's individual relationship with news and media. Utilized psychoeducation, CBT, and supportive reflection interventions to aid patient in exploring presence of depression and anxiety in individuals throughout history,  comparison to rights of today, and contributing factors to access of media and notifications of current events and real time, proving to reduce depressive and anxious symptoms or responses and early a history, and exacerbating that of individuals today.  Further supported patient in processing the way in which media serves to support and/or drive various perspectives, further impacting individuals moods. Provided support and validation for pt's identified thoughts and feelings.  Clinician reassessed severity of presenting sxs, and presence of any safety concerns. Clinician provided support and empathy to patient during session.  Homework: Ship broker, begin exploration of individual activities pt find enjoyment in.  Plan: Return again in 1 weeks.  Diagnosis:  Encounter Diagnoses  Name Primary?   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  Patient/Guardian was advised Release of Information must be obtained prior to any record release in order to collaborate their care with an outside provider. Patient/Guardian was advised if they have not already done so to contact the registration department to sign all necessary forms in order for us  to release information regarding their care.   Consent: Patient/Guardian gives verbal consent for treatment and assignment of benefits for services provided during this visit. Patient/Guardian expressed understanding and agreed to proceed.   Lynwood JONETTA Maris, MSW, LCSW 11/18/2023,  9:02 AM

## 2023-11-25 ENCOUNTER — Ambulatory Visit (HOSPITAL_COMMUNITY): Admitting: Licensed Clinical Social Worker

## 2023-11-25 DIAGNOSIS — F332 Major depressive disorder, recurrent severe without psychotic features: Secondary | ICD-10-CM

## 2023-11-25 DIAGNOSIS — F4310 Post-traumatic stress disorder, unspecified: Secondary | ICD-10-CM

## 2023-11-25 NOTE — Progress Notes (Signed)
 THERAPIST PROGRESS NOTE  Session Date: 11/25/2023  Session Time: 0905 - 1011 Virtual Visit via Video Note  I connected with Zachary [name] on 11/25/23 at  9:00 AM EDT by a video enabled telemedicine application and verified that I am speaking with the correct person using two identifiers.  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 The patient expressed understanding and agreed to proceed.  The patient was advised to call back or seek an in-person evaluation if the symptoms worsen or if the condition fails to improve as anticipated.  I provided 66 minutes of non-face-to-face time during this encounter.  Participation Level: Active  Behavioral Response: CasualAlertDysphoric  Type of Therapy: Individual Therapy   Treatment Goals addressed: - Reduce frequency, intensity, and duration of depression symptoms so that daily functioning is improved (OP Depression) - Increase coping skills to manage depression and improve ability to perform daily activities (OP Depression) - Zachary [name] will identify cognitive patterns and beliefs that support depression (OP Depression) - Improving my mindset (OP Depression) - Zachary [name] will identify situations, thoughts, and feelings that trigger internal anger, and/or angry/aggressive actions as evidenced by self-report (Anger Management) - Ensure it doesn't impact anyone else (Anger Management)  ProgressTowards Goals: Progressing  Interventions: CBT, Motivational Interviewing, Supportive, and Reframing  Summary: Zachary [name] is a 53 y.o. male with past psych history of MDD and PTSD, presenting for follow-up therapy session in efforts to improve management of depressive symptoms.   Patient actively engaged throughout session, presenting in variable moods, primarily dysphoric with brightening in periods, and congruent affect  throughout visit, engaging in check-in, sharing I'm doing alright. Reassessed depressive and anxious sxs via PHQ-9 and GAD-7, identifying minimal reduction in screening scores, maintained mild severities. Actively engaged in reflection of presenting stressors, noting of Number one issue recently has been the house, I don't really have any numbers from contractor yet, and need to get at least two other quotes, the unknown really being the main stressor, further processing individual efforts at accepting inabilities to address stressors at this time, and continued efforts of reminding self that he is only able to accept what will come. Began exploration of individual considerations of obtaining a support dog, uncertain of whether to explore one that's already trained or to obtain a puppy and train to support, processing individual hx of owning dogs. Further engaged continued exploration of pt's challenges surrounding accepting what is able to be changed, processing pt's negative perspectives related to political climate and overall worldly conflicts. Actively processed abilities at improving perspectives and outlook in efforts of improving moods.  Patient responded well to interventions. Patient continues to meet criteria for MDD and PTSD. Patient will continue to benefit from engagement in outpatient therapy due to being the least restrictive service to meet presenting needs.      11/25/2023    9:07 AM 11/11/2023    9:09 AM 10/28/2023    9:10 AM 09/30/2023   11:07 AM  GAD 7 : Generalized Anxiety Score  Nervous, Anxious, on Edge 1 1 1 1   Control/stop worrying 1 1 1 1   Worry too much - different things 1 1 1 1   Trouble relaxing 2 2 1 1   Restless 0 1 0 0  Easily annoyed or irritable 2 1 1 1   Afraid - awful might happen 0 1 0 1  Total GAD 7 Score 7 8 5 6   Anxiety Difficulty  Somewhat difficult Somewhat difficult Very difficult Somewhat difficult      11/25/2023    9:09 AM 11/11/2023    9:07 AM  10/28/2023    9:12 AM 09/30/2023   11:04 AM 09/13/2023    3:14 PM  Depression screen PHQ 2/9  Decreased Interest 1 1 1  0 0  Down, Depressed, Hopeless 1 1 1  0 0  PHQ - 2 Score 2 2 2  0 0  Altered sleeping 2 2 1 1    Tired, decreased energy 2 2 2 1    Change in appetite  0 0 0   Feeling bad or failure about yourself  0 1 1 1    Trouble concentrating 1 1 1 1    Moving slowly or fidgety/restless 0 0 0 0   Suicidal thoughts 0 0 0 0   PHQ-9 Score 7 8 7 4    Difficult doing work/chores Somewhat difficult Very difficult Somewhat difficult Somewhat difficult    Flowsheet Row Counselor from 07/14/2023 in [facility] from 04/21/2023 in [facility]GRAM ED from 04/13/2023 in [facility]  C-SSRS RISK CATEGORY High Risk High Risk High Risk   Suicidal/Homicidal: Nowithout intent/plan  Therapist Response: Clinician utilized CBT, MI, Solution focused and supportive reflection techniques to address presenting challenges.   Clinician actively greeted pt upon joining virtual visit, assessing presenting moods and affect, and engaging in introductory check-in. Openly engaged pt via open ended questions in exploration of recent events of the past week, and factors contributing to presenting moods. Actively listened to pt's recounts of events, and continued challenges surrounding household repairs and unknown financial burden proving to increase stress at this time. Provided support and validation of expressed thoughts and feelings, processing pt's efforts at managing factors. Reassessed presenting depressive and anxious sxs via PHQ-9 and GAD-7, noting of maintained mild sxs, and processing individual steps surrounding management of stress. Processed pt's isolative nature, and ambivalence surrounding solitude and challenges in exploring relationships as well as other challenges encountered in maintaining  relationships. Provided psychoed, solution focused, and supportive reflection interventions to aid pt in processing presenting challenges and individual approaches to navigating such.   Clinician reassessed severity of presenting sxs, and presence of any safety concerns. Clinician provided support and empathy to patient during session.  Homework: None.  Plan: Return again in 1 weeks.  Diagnosis:  Encounter Diagnoses  Name Primary?   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  Patient/Guardian was advised Release of Information must be obtained prior to any record release in order to collaborate their care with an outside provider. Patient/Guardian was advised if they have not already done so to contact the registration department to sign all necessary forms in order for us  to release information regarding their care.   Consent: Patient/Guardian gives verbal consent for treatment and assignment of benefits for services provided during this visit. Patient/Guardian expressed understanding and agreed to proceed.   Lynwood JONETTA Maris, MSW, LCSW 11/25/2023,  9:11 AM

## 2023-12-02 ENCOUNTER — Ambulatory Visit (HOSPITAL_COMMUNITY): Admitting: Licensed Clinical Social Worker

## 2023-12-02 DIAGNOSIS — F332 Major depressive disorder, recurrent severe without psychotic features: Secondary | ICD-10-CM | POA: Diagnosis not present

## 2023-12-02 DIAGNOSIS — G479 Sleep disorder, unspecified: Secondary | ICD-10-CM

## 2023-12-02 DIAGNOSIS — F4310 Post-traumatic stress disorder, unspecified: Secondary | ICD-10-CM | POA: Diagnosis not present

## 2023-12-02 NOTE — Progress Notes (Signed)
 THERAPIST PROGRESS NOTE  Session Date: 12/02/2023  Session Time: 1000 - 1104 Virtual Visit via Video Note  I connected with Zachary [name] on 12/02/23 at 10:00 AM EDT by a video enabled telemedicine application and verified that I am speaking with the correct person using two identifiers.  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 The patient expressed understanding and agreed to proceed.  The patient was advised to call back or seek an in-person evaluation if the symptoms worsen or if the condition fails to improve as anticipated.  I provided 63 minutes of non-face-to-face time during this encounter.  Participation Level: Active  Behavioral Response: CasualAlertDysphoric  Type of Therapy: Individual Therapy   Treatment Goals addressed:  Progressing (4) LTG: Increase coping skills to manage depression and improve ability to perform daily activities (OP Depression) STG: Zachary [name] will identify cognitive patterns and beliefs that support depression (OP Depression) STG: Zachary [name] will identify situations, thoughts, and feelings that trigger internal anger, and/or angry/aggressive actions as evidenced by self-report (Anger Management) LTG: Ensure it doesn't impact anyone else (Anger Management)  Not Progressing (2) LTG: Reduce frequency, intensity, and duration of depression symptoms so that daily functioning is improved (OP Depression) LTG: Improving my mindset (OP Depression)  ProgressTowards Goals: Progressing  Interventions: CBT, Motivational Interviewing, Supportive, and Reframing  Summary: Zachary [name] is a 53 y.o. male with past psych history of MDD and PTSD, presenting for follow-up therapy session in efforts to improve management of depressive symptoms.   Patient actively engaged throughout session, presenting in depressed moods, and congruent  affect throughout visit, engaging in check-in, sharing Not sleeping well, got a lot going on, further detailing of having had a plumber come out yesterday due clog and finding of needing further repairs in addition to outstanding bathroom renovation issues, processing of possibility of 401K hardship withdrawal and awaiting approval for potential 2 weeks. Processed availability of support, reflecting on input from father, and limited support from others. Pt further detailed limited supports, lacking supportive peer relationships and long term friends inabilities to support due to consistent lack of emotional intelligence. Extensively processed individual perspectives surrounding general lack of empathy in majority of individuals and overall doubt of abilities to secure healthy, emotionally supportive relationships. Further explored pt's overall outlook and interest in seeking out and establishing emotionally supportive relationships, and hx of hurt and being let down within relationships in the past.  Patient responded well to interventions. Patient continues to meet criteria for MDD and PTSD. Patient will continue to benefit from engagement in outpatient therapy due to being the least restrictive service to meet presenting needs.      11/25/2023    9:07 AM 11/11/2023    9:09 AM 10/28/2023    9:10 AM 09/30/2023   11:07 AM  GAD 7 : Generalized Anxiety Score  Nervous, Anxious, on Edge 1 1 1 1   Control/stop worrying 1 1 1 1   Worry too much - different things 1 1 1 1   Trouble relaxing 2 2 1 1   Restless 0 1 0 0  Easily annoyed or irritable 2 1 1 1   Afraid - awful might happen 0 1 0 1  Total GAD 7 Score 7 8 5 6   Anxiety Difficulty Somewhat difficult Somewhat difficult Very difficult Somewhat difficult      11/25/2023    9:09 AM 11/11/2023    9:07 AM 10/28/2023    9:12  AM 09/30/2023   11:04 AM 09/13/2023    3:14 PM  Depression screen PHQ 2/9  Decreased Interest 1 1 1  0 0  Down, Depressed, Hopeless 1 1  1  0 0  PHQ - 2 Score 2 2 2  0 0  Altered sleeping 2 2 1 1    Tired, decreased energy 2 2 2 1    Change in appetite  0 0 0   Feeling bad or failure about yourself  0 1 1 1    Trouble concentrating 1 1 1 1    Moving slowly or fidgety/restless 0 0 0 0   Suicidal thoughts 0 0 0 0   PHQ-9 Score 7 8 7 4    Difficult doing work/chores Somewhat difficult Very difficult Somewhat difficult Somewhat difficult    Flowsheet Row Counselor from 07/14/2023 in [facility] at [facility] from 04/21/2023 in [facility]PITAL[facility]GRAM ED from 04/13/2023 in [facility]  C-SSRS RISK CATEGORY High Risk High Risk High Risk   Suicidal/Homicidal: Nowithout intent/plan  Therapist Response: Clinician utilized CBT, MI, Solution focused and supportive reflection techniques to address presenting challenges.   Clinician actively greeted pt upon joining virtual visit, assessing presenting moods and affect. Openly engaged pt in introductory check-in, prompting pt's recounts of events of the past week, and factors contributing to poor moods. Actively listened to pt's reports of stressors of the past week impacting moods, noting of additional stressors surrounding household maintenance/repair needs, providing support in processing individual efforts at navigating financial stress/limitations. Provided support and validation of identified thoughts, feelings and perspectives surrounding presenting stressors, utilizing socratic questions to further prompt greater critical processing of thoughts and perspectives in relation to challenges.  Utilized CBT, MI, solution focused interventions to support pt in exploring thoughts and perspectives, distortions, interests, readiness, and outlook surrounding social relationships, and overall views of the nature of individuals.  Clinician reassessed severity of presenting sxs, and presence of any safety  concerns. Clinician provided support and empathy to patient during session.  Homework: None.  Plan: Return again in 2 weeks.  Diagnosis:  Encounter Diagnoses  Name Primary?   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  Patient/Guardian was advised Release of Information must be obtained prior to any record release in order to collaborate their care with an outside provider. Patient/Guardian was advised if they have not already done so to contact the registration department to sign all necessary forms in order for us  to release information regarding their care.   Consent: Patient/Guardian gives verbal consent for treatment and assignment of benefits for services provided during this visit. Patient/Guardian expressed understanding and agreed to proceed.   Lynwood JONETTA Maris, MSW, LCSW 12/02/2023,  10:01 AM

## 2023-12-04 NOTE — Progress Notes (Incomplete)
 THERAPIST PROGRESS NOTE  Session Date: 12/02/2023  Session Time: 1000 - 1104 Virtual Visit via Video Note  I connected with Zachary [name] on 12/02/23 at 10:00 AM EDT by a video enabled telemedicine application and verified that I am speaking with the correct person using two identifiers.  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 The patient expressed understanding and agreed to proceed.  The patient was advised to call back or seek an in-person evaluation if the symptoms worsen or if the condition fails to improve as anticipated.  I provided 63 minutes of non-face-to-face time during this encounter.  Participation Level: Active  Behavioral Response: CasualAlertDysphoric  Type of Therapy: Individual Therapy   Treatment Goals addressed:  Progressing (4) LTG: Increase coping skills to manage depression and improve ability to perform daily activities (OP Depression) STG: Zachary [name] will identify cognitive patterns and beliefs that support depression (OP Depression) STG: Zachary [name] will identify situations, thoughts, and feelings that trigger internal anger, and/or angry/aggressive actions as evidenced by self-report (Anger Management) LTG: Ensure it doesn't impact anyone else (Anger Management)  Not Progressing (2) LTG: Reduce frequency, intensity, and duration of depression symptoms so that daily functioning is improved (OP Depression) LTG: Improving my mindset (OP Depression)  ProgressTowards Goals: Progressing  Interventions: CBT, Motivational Interviewing, Supportive, and Reframing  Summary: Zachary [name] is a 53 y.o. male with past psych history of MDD and PTSD, presenting for follow-up therapy session in efforts to improve management of depressive symptoms.   Patient actively engaged throughout session, presenting in depressed moods, and congruent  affect throughout visit, engaging in check-in, sharing Not sleeping well, got a lot going on, further detailing of having had a plumber come out yesterday due clog and finding of needing further repairs in addition to outstanding bathroom renovation issues, processing of possibility of 401K hardship withdrawal and awaiting approval for potential 2 weeks. Processed availability of support, reflecting on input from father, and limited support from others. ***     I'm doing alright. Reassessed depressive and anxious sxs via PHQ-9 and GAD-7, identifying minimal reduction in screening scores, maintained mild severities. Actively engaged in reflection of presenting stressors, noting of Number one issue recently has been the house, I don't really have any numbers from contractor yet, and need to get at least two other quotes, the unknown really being the main stressor, further processing individual efforts at accepting inabilities to address stressors at this time, and continued efforts of reminding self that he is only able to accept what will come. Began exploration of individual considerations of obtaining a support dog, uncertain of whether to explore one that's already trained or to obtain a puppy and train to support, processing individual hx of owning dogs. Further engaged continued exploration of pt's challenges surrounding accepting what is able to be changed, processing pt's negative perspectives related to political climate and overall worldly conflicts. Actively processed abilities at improving perspectives and outlook in efforts of improving moods.  Patient responded well to interventions. Patient continues to meet criteria for MDD and PTSD. Patient will continue to benefit from engagement in outpatient therapy due to being the least restrictive service to meet presenting needs.      11/25/2023    9:07 AM 11/11/2023    9:09 AM 10/28/2023    9:10 AM 09/30/2023   11:07 AM  GAD 7 : Generalized  Anxiety Score  Nervous, Anxious, on  Edge 1 1 1 1   Control/stop worrying 1 1 1 1   Worry too much - different things 1 1 1 1   Trouble relaxing 2 2 1 1   Restless 0 1 0 0  Easily annoyed or irritable 2 1 1 1   Afraid - awful might happen 0 1 0 1  Total GAD 7 Score 7 8 5 6   Anxiety Difficulty Somewhat difficult Somewhat difficult Very difficult Somewhat difficult      11/25/2023    9:09 AM 11/11/2023    9:07 AM 10/28/2023    9:12 AM 09/30/2023   11:04 AM 09/13/2023    3:14 PM  Depression screen PHQ 2/9  Decreased Interest 1 1 1  0 0  Down, Depressed, Hopeless 1 1 1  0 0  PHQ - 2 Score 2 2 2  0 0  Altered sleeping 2 2 1 1    Tired, decreased energy 2 2 2 1    Change in appetite  0 0 0   Feeling bad or failure about yourself  0 1 1 1    Trouble concentrating 1 1 1 1    Moving slowly or fidgety/restless 0 0 0 0   Suicidal thoughts 0 0 0 0   PHQ-9 Score 7 8 7 4    Difficult doing work/chores Somewhat difficult Very difficult Somewhat difficult Somewhat difficult    Flowsheet Row Counselor from 07/14/2023 in [facility] from 04/21/2023 in [facility]PITAL[facility] PROGRAM ED from 04/13/2023 in [facility]  C-SSRS RISK CATEGORY High Risk High Risk High Risk   Suicidal/Homicidal: Nowithout intent/plan  Therapist Response: Clinician utilized CBT, MI, Solution focused and supportive reflection techniques to address presenting challenges.   Clinician actively greeted pt upon joining virtual visit, assessing presenting moods and affect, and engaging in introductory check-in. Openly engaged pt via open ended questions in exploration of recent events of the past week, and factors contributing to presenting moods. Actively listened to pt's recounts of events, and continued challenges surrounding household repairs and unknown financial burden proving to increase stress at this time. Provided support and  validation of expressed thoughts and feelings, processing pt's efforts at managing factors. Reassessed presenting depressive and anxious sxs via PHQ-9 and GAD-7, noting of maintained mild sxs, and processing individual steps surrounding management of stress. Processed pt's isolative nature, and ambivalence surrounding solitude and challenges in exploring relationships as well as other challenges encountered in maintaining relationships. Provided psychoed, solution focused, and supportive reflection interventions to aid pt in processing presenting challenges and individual approaches to navigating such.   Clinician reassessed severity of presenting sxs, and presence of any safety concerns. Clinician provided support and empathy to patient during session.  Homework: None.  Plan: Return again in 2 weeks.  Diagnosis:  Encounter Diagnoses  Name Primary?  . 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 PTSD (post-traumatic stress disorder)   . Sleep disturbance      Collaboration of Care: Other none necessary at this time.  Patient/Guardian was advised Release of Information must be obtained prior to any record release in order to collaborate their care with an outside provider. Patient/Guardian was advised if they have not already done so to contact the registration department to sign all necessary forms in order for us  to release information regarding their care.   Consent: Patient/Guardian gives verbal consent for treatment and assignment of benefits for services provided during this visit. Patient/Guardian expressed understanding and agreed to  proceed.   Zachary [name], MSW, LCSW 12/02/2023,  10:01 AM

## 2023-12-05 ENCOUNTER — Other Ambulatory Visit: Payer: Self-pay | Admitting: Family Medicine

## 2023-12-05 DIAGNOSIS — E66812 Obesity, class 2: Secondary | ICD-10-CM

## 2023-12-05 NOTE — Telephone Encounter (Unsigned)
 Copied from CRM [id number]. Topic: Clinical - Medication Refill >> Dec 05, 2023  3:55 PM Deaijah H wrote: Medication: tirzepatide  (ZEPBOUND ) 15 MG/0.5ML Pen  Has the patient contacted their pharmacy? Yes (Agent: If no, request that the patient contact the pharmacy for the refill. If patient does not wish to contact the pharmacy document the reason why and proceed with request.) (Agent: If yes, when and what did the pharmacy advise?) No longer able to fill Zepbound   This is the patient's preferred pharmacy:  [facility]  [address] [place], [place] [address] Phone : [phone]  Is this the correct pharmacy for this prescription? Yes If no, delete pharmacy and type the correct one.   Has the prescription been filled recently? No  Is the patient out of the medication? Yes  Has the patient been seen for an appointment in the last year OR does the patient have an upcoming appointment? Yes  Can we respond through MyChart? Yes  Agent: Please be advised that Rx refills may take up to 3 business days. We ask that you follow-up with your pharmacy.

## 2023-12-06 MED ORDER — ZEPBOUND 15 MG/0.5ML ~~LOC~~ SOAJ: 15.0000 mg | SUBCUTANEOUS | 3 refills | Status: DC

## 2023-12-06 NOTE — Progress Notes (Unsigned)
 Referring-Charlotte Lum NP Reason for referral-CAD  HPI: 53 year old male for evaluation of CAD at request of Roselie [place] NP.  Had STEMI September 2024.  Cardiac catheterization at [facility] showed occluded LAD and otherwise nonobstructive coronary disease and normal LVEDP.  Patient had successful PCI of the mid LAD.  Echocardiogram September 2024 showed ejection fraction 40%, mild left ventricular hypertrophy, grade 1 diastolic dysfunction, hypokinesis of the mid to distal anterior/anteroseptal wall and apex, mild to moderate right ventricular enlargement, mild right atrial enlargement, mildly dilated ascending aorta.  Patient has not followed up with [facility] since the initial event.  Cardiology now asked to evaluate.  Current Outpatient Medications  Medication Sig Dispense Refill   allopurinol  (ZYLOPRIM ) 100 MG tablet Take 1 tablet (100 mg total) by mouth daily. 90 tablet 3   aspirin  81 MG chewable tablet Chew 1 tablet (81 mg total) by mouth daily. 90 tablet 3   atorvastatin  (LIPITOR) 80 MG tablet Take 1 tablet (80 mg total) by mouth at bedtime. 90 tablet 3   escitalopram  (LEXAPRO ) 20 MG tablet TAKE 1 TABLET(20 MG) BY MOUTH DAILY 30 tablet 0   esomeprazole  (NEXIUM ) 40 MG capsule Take 1 capsule (40 mg total) by mouth daily. 30 capsule 3   furosemide  (LASIX ) 20 MG tablet Take 1 tablet (20 mg total) by mouth daily. 90 tablet 3   hydrochlorothiazide  (HYDRODIURIL ) 12.5 MG tablet Take 1 tablet (12.5 mg total) by mouth daily. 90 tablet 3   isosorbide  mononitrate (IMDUR ) 30 MG 24 hr tablet Take 1 tablet (30 mg total) by mouth daily. 90 tablet 3   losartan  (COZAAR ) 25 MG tablet Take 0.5 tablets (12.5 mg total) by mouth daily. 90 tablet 3   Melatonin 5 MG CAPS Take 1 capsule (5 mg total) by mouth at bedtime. 90 capsule 3   mirtazapine  (REMERON ) 7.5 MG tablet Take 1 tablet (7.5 mg total) by mouth at bedtime. 60 tablet 1   ticagrelor  (BRILINTA ) 90 MG TABS tablet Take 1 tablet (90 mg total) by mouth 2  (two) times daily. 180 tablet 3   tirzepatide  (ZEPBOUND ) 15 MG/0.5ML Pen Inject 15 mg into the skin once a week. 6 mL 3   No current facility-administered medications for this visit.    No Known Allergies   Past Medical History:  Diagnosis Date   Anxiety    GERD (gastroesophageal reflux disease)    Hyperlipidemia    Hypertension    Myocardial infarction [place])     Past Surgical History:  Procedure Laterality Date   CARDIAC CATHETERIZATION     HEMORRHOID SURGERY     HEMORRHOID SURGERY N/A 09/13/2014   Procedure: HEMORRHOIDECTOMY;  Surgeon: Unknown Sharps, MD;  Location: [facility] ORS;  Service: General;  Laterality: N/A;    Social History   Socioeconomic History   Marital status: Divorced    Spouse name: Not on file   Number of children: 0   Years of education: Not on file   Highest education level: Associate degree: occupational, Scientist, product/process development, or vocational program  Occupational History   Not on file  Tobacco Use   Smoking status: Every Day    Types: Cigars    Passive exposure: Current   Smokeless tobacco: Never   Tobacco comments:    5cigars weekly  Vaping Use   Vaping status: Never Used  Substance and Sexual Activity   Alcohol use: Yes    Comment: occassionally   Drug use: No   Sexual activity: Yes  Other Topics Concern  Not on file  Social History Narrative   Not on file   Social Drivers of Health   Financial Resource Strain: Medium Risk (05/25/2023)   Overall Financial Resource Strain (CARDIA)    Difficulty of Paying Living Expenses: Somewhat hard  Food Insecurity: No Food Insecurity (05/25/2023)   Hunger Vital Sign    Worried About Running Out of Food in the Last Year: Never true    Ran Out of Food in the Last Year: Never true  Transportation Needs: Unmet Transportation Needs (05/25/2023)   PRAPARE - Transportation    Lack of Transportation (Medical): Yes    Lack of Transportation (Non-Medical): Yes  Physical Activity: Unknown (05/25/2023)   Exercise Vital  Sign    Days of Exercise per Week: Patient declined    Minutes of Exercise per Session: Not on file  Stress: Stress Concern Present (05/25/2023)   [employer] of Occupational Health - Occupational Stress Questionnaire    Feeling of Stress : Very much  Social Connections: Moderately Isolated (05/25/2023)   Social Connection and Isolation Panel    Frequency of Communication with Friends and Family: Three times a week    Frequency of Social Gatherings with Friends and Family: Three times a week    Attends Religious Services: Never    Active Member of Clubs or Organizations: Yes    Attends Engineer, structural: More than 4 times per year    Marital Status: Divorced  Catering manager Violence: Not on file    Family History  Problem Relation Age of Onset   Depression Mother    Alcohol abuse Mother    Alcohol abuse Maternal Grandfather    Heart disease Maternal Grandfather 1   Alcohol abuse Paternal Grandfather    Bipolar disorder Cousin     ROS: no fevers or chills, productive cough, hemoptysis, dysphasia, odynophagia, melena, hematochezia, dysuria, hematuria, rash, seizure activity, orthopnea, PND, pedal edema, claudication. Remaining systems are negative.  Physical Exam:   There were no vitals taken for this visit.  General:  Well developed/well nourished in NAD Skin warm/dry Patient not depressed No peripheral clubbing Back-normal HEENT-normal/normal eyelids Neck supple/normal carotid upstroke bilaterally; no bruits; no JVD; no thyromegaly chest - CTA/ normal expansion CV - RRR/normal S1 and S2; no murmurs, rubs or gallops;  PMI nondisplaced Abdomen -NT/ND, no HSM, no mass, + bowel sounds, no bruit 2+ femoral pulses, no bruits Ext-no edema, chords, 2+ DP Neuro-grossly nonfocal  ECG - personally reviewed  A/P  1 coronary artery disease-patient is status post anterior infarct with PCI of the LAD in September 2024.  At that time his ejection fraction was  40%.  Will repeat echocardiogram to reassess LV function.  He is presently on low-dose losartan  which could be transition to Entresto if LV function remains decreased.  Will add Toprol 25 mg daily.  Continue aspirin  and statin.  Discontinue Brilinta  at the end of September.  2 hyperlipidemia-continue Lipitor.  Check lipids and liver with goal LDL less than 55.  3 ischemic cardiomyopathy-plan repeat echocardiogram as described above.  4 hypertension-  Redell Shallow, MD

## 2023-12-07 ENCOUNTER — Other Ambulatory Visit (HOSPITAL_COMMUNITY): Payer: Self-pay | Admitting: Family

## 2023-12-07 ENCOUNTER — Encounter: Payer: Self-pay | Admitting: Cardiology

## 2023-12-07 ENCOUNTER — Ambulatory Visit: Attending: Cardiology | Admitting: Cardiology

## 2023-12-07 VITALS — BP 100/60 | HR 53 | Ht 68.5 in | Wt 222.0 lb

## 2023-12-07 DIAGNOSIS — I10 Essential (primary) hypertension: Secondary | ICD-10-CM | POA: Diagnosis not present

## 2023-12-07 DIAGNOSIS — I219 Acute myocardial infarction, unspecified: Secondary | ICD-10-CM

## 2023-12-07 DIAGNOSIS — I2510 Atherosclerotic heart disease of native coronary artery without angina pectoris: Secondary | ICD-10-CM | POA: Diagnosis not present

## 2023-12-07 DIAGNOSIS — E7800 Pure hypercholesterolemia, unspecified: Secondary | ICD-10-CM | POA: Diagnosis not present

## 2023-12-07 MED ORDER — FUROSEMIDE 20 MG PO TABS: 20.0000 mg | ORAL_TABLET | Freq: Every day | ORAL | 3 refills | Status: AC | PRN

## 2023-12-07 NOTE — Patient Instructions (Signed)
 Medication Instructions:   STOP hydrochlorothiazide   STOP ISOSORBIDE   TAKE FUROSEMIDE  20 MG ONCE DAILY AS NEEDED  STOP BRILINTA  THE END OF SEPTEMBER  *If you need a refill on your cardiac medications before your next appointment, please call your pharmacy*  Lab Work:  Your physician recommends that you return for lab work FASTING  If you have labs (blood work) drawn today and your tests are completely normal, you will receive your results only by: MyChart Message (if you have MyChart) OR A paper copy in the mail If you have any lab test that is abnormal or we need to change your treatment, we will call you to review the results.  Testing/Procedures:  Your physician has requested that you have an echocardiogram. Echocardiography is a painless test that uses sound waves to create images of your heart. It provides your doctor with information about the size and shape of your heart and how well your heart's chambers and valves are working. This procedure takes approximately one hour. There are no restrictions for this procedure. Please do NOT wear cologne, perfume, aftershave, or lotions (deodorant is allowed). Please arrive 15 minutes prior to your appointment time.  Please note: We ask at that you not bring children with you during ultrasound (echo/ vascular) testing. Due to room size and safety concerns, children are not allowed in the ultrasound rooms during exams. Our front office staff cannot provide observation of children in our lobby area while testing is being conducted. An adult accompanying a patient to their appointment will only be allowed in the ultrasound room at the discretion of the ultrasound technician under special circumstances. We apologize for any inconvenience. [address]  Follow-Up: At [facility], you and your health needs are our priority.  As part of our continuing mission to provide you with exceptional heart care, our providers are all part of  one team.  This team includes your primary Cardiologist (physician) and Advanced Practice Providers or APPs (Physician Assistants and Nurse Practitioners) who all work together to provide you with the care you need, when you need it.  Your next appointment:   6 month(s)  Provider:   REDELL SHALLOW MD

## 2023-12-09 ENCOUNTER — Ambulatory Visit (HOSPITAL_COMMUNITY): Admitting: Licensed Clinical Social Worker

## 2023-12-11 ENCOUNTER — Other Ambulatory Visit: Payer: Self-pay | Admitting: Family Medicine

## 2023-12-11 DIAGNOSIS — F325 Major depressive disorder, single episode, in full remission: Secondary | ICD-10-CM

## 2023-12-13 ENCOUNTER — Encounter: Payer: Self-pay | Admitting: Family Medicine

## 2023-12-16 ENCOUNTER — Encounter (HOSPITAL_COMMUNITY): Payer: Self-pay

## 2023-12-16 ENCOUNTER — Ambulatory Visit (HOSPITAL_COMMUNITY): Payer: Self-pay | Admitting: Licensed Clinical Social Worker

## 2023-12-16 DIAGNOSIS — Z91199 Patient's noncompliance with other medical treatment and regimen due to unspecified reason: Secondary | ICD-10-CM

## 2023-12-16 NOTE — Progress Notes (Signed)
 THERAPIST PROGRESS NOTE   Session Date: 12/16/2023  Session Time: 1000  Patient contacted office at approx 0920 detailing inability to attend today's scheduled appt, having believed appt to be for 0900. Office informed pt of being considered a no-show for today's appointment; appointment was for follow up therapy visit, provider notified for review of record, patient agrees to reschedule missed appointment.    Lynwood JONETTA Maris, MSW, LCSW 12/16/2023,  10:16 AM

## 2023-12-19 ENCOUNTER — Encounter: Payer: Self-pay | Admitting: Family Medicine

## 2023-12-20 ENCOUNTER — Encounter: Payer: Self-pay | Admitting: Family Medicine

## 2023-12-20 ENCOUNTER — Ambulatory Visit: Admitting: Family Medicine

## 2023-12-20 VITALS — BP 111/76 | HR 80 | Temp 97.0°F | Resp 18 | Wt 227.0 lb

## 2023-12-20 DIAGNOSIS — E6609 Other obesity due to excess calories: Secondary | ICD-10-CM

## 2023-12-20 DIAGNOSIS — I10 Essential (primary) hypertension: Secondary | ICD-10-CM

## 2023-12-20 DIAGNOSIS — Z1211 Encounter for screening for malignant neoplasm of colon: Secondary | ICD-10-CM

## 2023-12-20 DIAGNOSIS — E66811 Obesity, class 1: Secondary | ICD-10-CM

## 2023-12-20 DIAGNOSIS — Z1212 Encounter for screening for malignant neoplasm of rectum: Secondary | ICD-10-CM

## 2023-12-20 DIAGNOSIS — M545 Low back pain, unspecified: Secondary | ICD-10-CM

## 2023-12-20 DIAGNOSIS — F332 Major depressive disorder, recurrent severe without psychotic features: Secondary | ICD-10-CM

## 2023-12-20 DIAGNOSIS — I252 Old myocardial infarction: Secondary | ICD-10-CM

## 2023-12-20 DIAGNOSIS — I2510 Atherosclerotic heart disease of native coronary artery without angina pectoris: Secondary | ICD-10-CM

## 2023-12-20 DIAGNOSIS — Z6834 Body mass index (BMI) 34.0-34.9, adult: Secondary | ICD-10-CM

## 2023-12-20 DIAGNOSIS — G8929 Other chronic pain: Secondary | ICD-10-CM

## 2023-12-20 MED ORDER — CYCLOBENZAPRINE HCL 5 MG PO TABS: 5.0000 mg | ORAL_TABLET | Freq: Three times a day (TID) | ORAL | 1 refills | Status: DC | PRN

## 2023-12-20 MED ORDER — DULOXETINE HCL 30 MG PO CPEP: ORAL_CAPSULE | ORAL | 1 refills | Status: DC

## 2023-12-20 NOTE — Patient Instructions (Signed)
  VISIT SUMMARY: Today, you were seen for ongoing back pain that has been present for over five years. Despite significant weight loss, your back pain has worsened. We discussed your history of coronary artery disease, past heart attack, and current medications. We also reviewed your chronic neck pain and depression management.  YOUR PLAN: -CHRONIC BACK PAIN: Chronic back pain is long-lasting pain in the back that persists for more than three months. You will be referred to [facility] for further evaluation and possible treatments like MRI, injections, or physical therapy. We are prescribing cyclobenzaprine  5 mg three times a day as needed for muscle relaxation, but be cautious as it may cause drowsiness. We will also transition you from escitalopram  to duloxetine , which may help with both mood and chronic pain.  -OBESITY: Obesity is a condition characterized by excessive body fat. You have successfully lost about 30 pounds with the help of tirzepatide  15 mg weekly. Continue taking tirzepatide  15 mg weekly to manage your weight.  -CORONARY ARTERY DISEASE AND HISTORY OF ST-ELEVATION MYOCARDIAL INFARCTION (STEMI): Coronary artery disease is a condition where the blood vessels supplying the heart are narrowed or blocked. You have a history of a heart attack and are currently taking ticagrelor  90 mg twice daily, which will be discontinued next month as advised by your cardiologist. Avoid NSAIDs as they increase the risk of bleeding while on ticagrelor .  -MAJOR DEPRESSIVE DISORDER: Major depressive disorder is a mental health condition characterized by persistent feelings of sadness and loss of interest. We will taper your current medication, escitalopram , and start you on duloxetine , which may help with both your mood and chronic pain. The tapering schedule for escitalopram  is 30 mg to 20 mg for two weeks, then 10 mg for two weeks, and finally 5 mg for two weeks before stopping. Duloxetine  will be started at  30 mg daily for two weeks, then increased to 60 mg daily.  -CHRONIC NECK PAIN WITH LEFT UPPER ARM PAIN: Chronic neck pain is long-lasting pain in the neck, sometimes accompanied by pain in the arm. You will be referred to [facility] for further evaluation and management.  INSTRUCTIONS: 1. Follow up with [facility] for further evaluation and management of your back and neck pain.  2. Take cyclobenzaprine  5 mg three times a day as needed for muscle relaxation, but be cautious of potential drowsiness.  3. Follow the tapering schedule for escitalopram  and start duloxetine  as discussed.  4. Continue taking tirzepatide  15 mg weekly for weight management.  5. Continue taking ticagrelor  90 mg twice daily until your cardiologist advises discontinuation. Avoid NSAIDs due to the risk of bleeding.

## 2023-12-20 NOTE — Progress Notes (Signed)
 Assessment & Plan   Assessment/Plan:     Assessment and Plan Assessment & Plan Chronic back pain Chronic back pain for over five years, primarily in the lower and mid-back, described as a burning sensation. Pain has worsened despite significant weight loss. No radicular symptoms reported. Previous treatments including acetaminophen , stretches, and lidocaine  patches have been ineffective. Imaging has not been performed previously. He is open to trying duloxetine  for potential dual benefit on mood and chronic pain. - Order x-ray of the back to assess for disc disease or other abnormalities - Refer to [facility] for further evaluation and management, including potential imaging and interventions such as MRI, injections, or physical therapy - Prescribe cyclobenzaprine  5 mg three times a day as needed for muscle relaxation, cautioning about potential sedation - Initiate transition from escitalopram  to duloxetine  for potential dual benefit on mood and chronic pain, with a tapering schedule for escitalopram  and titration for duloxetine   Obesity Significant weight loss of approximately 30 pounds achieved with tirzepatide  15 mg weekly. Weight loss has not alleviated back pain but has improved knee discomfort. - Continue tirzepatide  15 mg weekly for weight management  Coronary artery disease and history of ST-elevation myocardial infarction (STEMI) Coronary artery disease and STEMI. Currently on ticagrelor  90 mg BID, with planned discontinuation next month as per cardiologist's guidance. NSAIDs are contraindicated due to increased bleeding risk with ticagrelor . - Continue ticagrelor  90 mg BID until cardiologist advises discontinuation - Avoid NSAIDs due to bleeding risk with ticagrelor   Major depressive disorder Currently managed with escitalopram  30 mg daily. Transition to duloxetine  is planned to address both mood and chronic pain. He is open to medication change. - Taper escitalopram  from 30  mg to 20 mg for two weeks, then 10 mg for two weeks, and finally 5 mg for two weeks before discontinuation - Initiate duloxetine  30 mg daily for two weeks, then increase to 60 mg daily  Chronic neck pain with left upper arm pain Chronic neck pain with associated left upper arm discomfort. Previous evaluation by orthopedics and trial of celecoxib were ineffective. NSAIDs are contraindicated due to cardiac history. - Refer to [facility] for further evaluation and management  Screen for Colon Cancer Cologuard was ordered per patient's preference.       Medications Discontinued During This Encounter  Medication Reason   escitalopram  (LEXAPRO ) 20 MG tablet     Return in about 1 month (around 01/20/2024) for mood, back pain .        Subjective:   Encounter date: 12/20/2023  Zachary [name] is a 52 y.o. male who has GERD (gastroesophageal reflux disease); Hemorrhoids; Obesity; Primary hypertension; Bilateral leg edema; Gout; CAD (coronary atherosclerotic disease); History of MI (myocardial infarction); Tobacco use disorder; STEMI involving left anterior descending coronary artery [facility]); MDD (major depressive disorder), recurrent episode, severe (HCC); PTSD (post-traumatic stress disorder); and Low TSH level on their problem list..   He  has a past medical history of Anxiety, GERD (gastroesophageal reflux disease), Hyperlipidemia, Hypertension, and Myocardial infarction (HCC).SABRA   He presents with chief complaint of Back Pain (Pt c/o of back pain for a 5 years and became worse over time. OTC tylenol  and stretching exercise were used //HM due- vaccinations and cologuard ) .   Discussed the use of AI scribe software for clinical note transcription with the patient, who gave verbal consent to proceed.  History of Present Illness Zachary [name] is a 53 year old male with coronary artery disease who presents with ongoing  back pain.  He has been experiencing back pain for over five  years, which has worsened despite a significant weight loss of approximately 30 pounds. The pain is described as a constant burning sensation in the middle of his back, particularly when standing for extended periods. There is no radiation of pain to other areas. He has tried various home exercises, stretches, and Tylenol  without relief.  He has a history of obesity and has been on tirzepatide  15 mg weekly, which has contributed to his weight loss. Despite the weight loss, his back pain has not improved. 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  He has a history of coronary artery disease and a past STEMI, for which he takes Brilinta  90 mg twice daily. He reports that he cannot take NSAIDs like Celebrex or Aleve and has not found relief with Tylenol .  He is currently on escitalopram  30 mg for mood management and occasionally uses mirtazapine . He has not had any imaging studies for his back pain and has not been to [facility] since 2023.     ROS  Past Surgical History:  Procedure Laterality Date   CARDIAC CATHETERIZATION     HEMORRHOID SURGERY N/A 09/13/2014   Procedure: HEMORRHOIDECTOMY;  Surgeon: Unknown Sharps, MD;  Location: [facility] ORS;  Service: General;  Laterality: N/A;   TONSILLECTOMY      Outpatient Medications Prior to Visit  Medication Sig Dispense Refill   allopurinol  (ZYLOPRIM ) 100 MG tablet Take 1 tablet (100 mg total) by mouth daily. 90 tablet 3   aspirin  81 MG chewable tablet Chew 1 tablet (81 mg total) by mouth daily. 90 tablet 3   atorvastatin  (LIPITOR) 80 MG tablet Take 1 tablet (80 mg total) by mouth at bedtime. 90 tablet 3   carvedilol (COREG) 6.25 MG tablet Take 6.25 mg by mouth 2 (two) times daily with a meal.     esomeprazole  (NEXIUM ) 40 MG capsule Take 1 capsule (40 mg total) by mouth daily. 30 capsule 3   furosemide  (LASIX ) 20 MG tablet Take 1 tablet (20 mg total) by mouth daily as needed. 90 tablet 3   losartan  (COZAAR ) 25 MG  tablet Take 0.5 tablets (12.5 mg total) by mouth daily. 90 tablet 3   Melatonin 5 MG CAPS Take 1 capsule (5 mg total) by mouth at bedtime. 90 capsule 3   mirtazapine  (REMERON ) 7.5 MG tablet Take 1 tablet (7.5 mg total) by mouth at bedtime. 60 tablet 1   ticagrelor  (BRILINTA ) 90 MG TABS tablet Take 1 tablet (90 mg total) by mouth 2 (two) times daily. 180 tablet 3   tirzepatide  (ZEPBOUND ) 15 MG/0.5ML Pen Inject 15 mg into the skin once a week. 6 mL 3   escitalopram  (LEXAPRO ) 20 MG tablet TAKE 1 TABLET(20 MG) BY MOUTH DAILY 30 tablet 0   No facility-administered medications prior to visit.    Family History  Problem Relation Age of Onset   Depression Mother    Alcohol abuse Mother    Alcohol abuse Maternal Grandfather    Heart disease Maternal Grandfather 4   Alcohol abuse Paternal Grandfather    Bipolar disorder Cousin     Social History   Socioeconomic History   Marital status: Divorced    Spouse name: Not on file   Number of children: 0   Years of education: Not on file   Highest education level: Some college, no degree  Occupational History   Not on file  Tobacco Use  Smoking status: Every Day    Types: Cigars    Passive exposure: Current   Smokeless tobacco: Never   Tobacco comments:    5cigars weekly  Vaping Use   Vaping status: Never Used  Substance and Sexual Activity   Alcohol use: Yes    Comment: occasionally   Drug use: No   Sexual activity: Yes  Other Topics Concern   Not on file  Social History Narrative   Not on file   Social Drivers of Health   Financial Resource Strain: Low Risk  (12/16/2023)   Overall Financial Resource Strain (CARDIA)    Difficulty of Paying Living Expenses: Not hard at all  Food Insecurity: No Food Insecurity (12/16/2023)   Hunger Vital Sign    Worried About Running Out of Food in the Last Year: Never true    Ran Out of Food in the Last Year: Never true  Transportation Needs: No Transportation Needs (12/16/2023)   PRAPARE -  Administrator, Civil Service (Medical): No    Lack of Transportation (Non-Medical): No  Physical Activity: Inactive (12/16/2023)   Exercise Vital Sign    Days of Exercise per Week: 0 days    Minutes of Exercise per Session: Not on file  Stress: Stress Concern Present (12/16/2023)   [employer] of Occupational Health - Occupational Stress Questionnaire    Feeling of Stress: Very much  Social Connections: Moderately Isolated (12/16/2023)   Social Connection and Isolation Panel    Frequency of Communication with Friends and Family: Three times a week    Frequency of Social Gatherings with Friends and Family: Twice a week    Attends Religious Services: Never    Database administrator or Organizations: Yes    Attends Engineer, structural: More than 4 times per year    Marital Status: Divorced  Catering manager Violence: Not on file                                                                                                  Objective:  Physical Exam: BP 111/76 (BP Location: Left Arm, Patient Position: Sitting, Cuff Size: Large)   Pulse 80   Temp (!) 97 F (36.1 C) (Temporal)   Resp 18   Wt 227 lb (103 kg)   SpO2 100%   BMI 34.01 kg/m   Wt Readings from Last 3 Encounters:  12/20/23 227 lb (103 kg)  12/07/23 222 lb (100.7 kg)  09/13/23 256 lb 8 oz (116.3 kg)    Physical Exam GENERAL: Alert, cooperative, well developed, no acute distress. HEENT: Normocephalic, normal oropharynx, moist mucous membranes. CHEST: Clear to auscultation bilaterally, no wheezes, rhonchi, or crackles. Lungs normal. CARDIOVASCULAR: Normal heart rate and rhythm, S1 and S2 normal without murmurs. ABDOMEN: Soft, non-tender, non-distended, without organomegaly, normal bowel sounds. EXTREMITIES: No cyanosis or edema. MUSCULOSKELETAL: No tenderness on palpation of the back. NEUROLOGICAL: Cranial nerves grossly intact, moves all extremities without gross motor or sensory  deficit.   Physical Exam  No results found.  No results found for this or any previous visit (from the  past 2160 hours).      Beverley Adine Hummer, MD, MS

## 2023-12-23 ENCOUNTER — Ambulatory Visit (INDEPENDENT_AMBULATORY_CARE_PROVIDER_SITE_OTHER): Admitting: Licensed Clinical Social Worker

## 2023-12-23 DIAGNOSIS — F332 Major depressive disorder, recurrent severe without psychotic features: Secondary | ICD-10-CM

## 2023-12-23 DIAGNOSIS — F4310 Post-traumatic stress disorder, unspecified: Secondary | ICD-10-CM | POA: Diagnosis not present

## 2023-12-23 NOTE — Progress Notes (Signed)
 THERAPIST PROGRESS NOTE  Session Date: 12/23/2023  Session Time: 1005 - 1110 Virtual Visit via Video Note  I connected with Zachary [name] on 12/23/23 at 10:00 AM EDT by a video enabled telemedicine application and verified that I am speaking with the correct person using two identifiers.  Location: Patient: Home Provider: Home Office   I discussed the limitations of evaluation and management by telemedicine and the availability of in person appointments. The patient expressed understanding and agreed to proceed.  The patient was advised to call back or seek an in-person evaluation if the symptoms worsen or if the condition fails to improve as anticipated.  I provided 65 minutes of non-face-to-face time during this encounter.  Participation Level: Active  Behavioral Response: CasualAlertDysphoric  Type of Therapy: Individual Therapy   Treatment Goals addressed:  Progressing (4) LTG: Increase coping skills to manage depression and improve ability to perform daily activities (OP Depression) STG: Shanti will identify cognitive patterns and beliefs that support depression (OP Depression) STG: Seabron will identify situations, thoughts, and feelings that trigger internal anger, and/or angry/aggressive actions as evidenced by self-report (Anger Management) LTG: Ensure it doesn't impact anyone else (Anger Management)  Not Progressing (2) LTG: Reduce frequency, intensity, and duration of depression symptoms so that daily functioning is improved (OP Depression) LTG: Improving my mindset (OP Depression)  ProgressTowards Goals: Progressing  Interventions: CBT, Motivational Interviewing, Supportive, and Reframing  Summary: Zachary [name] is a 53 y.o. male with past psych history of MDD and PTSD, presenting for follow-up therapy session in efforts to improve management of depressive symptoms.   Patient actively engaged throughout session, presenting in depressed moods, and congruent affect  throughout visit, engaging in check-in, sharing Things are going, further detailing of recent events and factors contributing to missed appt last week, sharing of having been in [place], [place] with founder of support club visiting [employer] [place]. 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 Further shared of having navigated financial stress surrounding plumbing needs and still experiencing stress awaiting estimates from contractors to address bathroom repairs. Reassessed presenting depressive and anxious sxs via PHQ-9 and GAD-7, noting of increased anxious sxs, further sharing of factors being surrounding financial strain and [place] barriers surrounding disability benefit. Pt shared of upcoming [facility] C&P exam next month to further assess headaches and migraines. Processed hx of trauma and the implications experienced trauma has proven to have of all aspects of pt's life.  Patient responded well to interventions. Patient continues to meet criteria for MDD and PTSD. Patient will continue to benefit from engagement in outpatient therapy due to being the least restrictive service to meet presenting needs.      12/23/2023   10:27 AM 11/25/2023    9:07 AM 11/11/2023    9:09 AM 10/28/2023    9:10 AM  GAD 7 : Generalized Anxiety Score  Nervous, Anxious, on Edge 2 1 1 1   Control/stop worrying 2 1 1 1   Worry too much - different things 1 1 1 1   Trouble relaxing 2 2 2 1   Restless 0 0 1 0  Easily annoyed or irritable 2 2 1 1   Afraid - awful might happen 1 0 1 0  Total GAD 7 Score 10 7 8 5   Anxiety Difficulty Somewhat difficult Somewhat difficult Somewhat difficult Very difficult      12/23/2023   11:01 AM 12/20/2023  2:52 PM 11/25/2023    9:09 AM 11/11/2023    9:07 AM 10/28/2023    9:12 AM  Depression screen PHQ 2/9  Decreased Interest 2 0 1 1 1   Down,  Depressed, Hopeless 2 0 1 1 1   PHQ - 2 Score 4 0 2 2 2   Altered sleeping 2  2 2 1   Tired, decreased energy 2  2 2 2   Change in appetite 0   0 0  Feeling bad or failure about yourself  2  0 1 1  Trouble concentrating 2  1 1 1   Moving slowly or fidgety/restless 0  0 0 0  Suicidal thoughts 1  0 0 0  PHQ-9 Score 13  7 8 7   Difficult doing work/chores Very difficult  Somewhat difficult Very difficult Somewhat difficult   Flowsheet Row Counselor from 07/14/2023 in [facility] from 04/21/2023 in [facility] ED from 04/13/2023 in [facility]  C-SSRS RISK CATEGORY High Risk High Risk High Risk   Suicidal/Homicidal: Yeswithout intent/plan; pSI occasional thoughts of feeling burdensome.  Therapist Response: Clinician utilized CBT, MI, Solution focused and supportive reflection techniques to address presenting challenges.   Clinician openly greeted pt upon joining virtual visit, assessing presenting moods and affect. Actively engaged pt in introductory check-in, eliciting pt's recounts of events of the recent weeks, and factors contributing to poor moods. Actively listened to pt's recounts of events of recent weeks, noting of expressed pleasurable experiences while visiting with support group founder and other members out of state, and time spent with close friend. Utilized socratic questions to aid pt in navigating thoughts and feelings surrounding anticipation of close friend relocating within the next year, how this may prove to impact relationship, and challenging irrational thoughts surrounding transition. Reassessed presenting depressive and anxious sxs via PHQ-9 and GAD-7, noting of continued increase in sxs, and processing pt's perspectives surrounding such.  Clinician reassessed severity of presenting sxs, and presence of any safety concerns. Clinician provided support and  empathy to patient during session.  Homework: Explore [employer] support groups to increase social connections.  Plan: Return again in 1 weeks.  Diagnosis:  Encounter Diagnoses  Name Primary?   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  Patient/Guardian was advised Release of Information must be obtained prior to any record release in order to collaborate their care with an outside provider. Patient/Guardian was advised if they have not already done so to contact the registration department to sign all necessary forms in order for us  to release information regarding their care.   Consent: Patient/Guardian gives verbal consent for treatment and assignment of benefits for services provided during this visit. Patient/Guardian expressed understanding and agreed to proceed.   Lynwood JONETTA Maris, MSW, LCSW 12/23/2023,  11:03 AM

## 2023-12-25 DIAGNOSIS — Z1212 Encounter for screening for malignant neoplasm of rectum: Secondary | ICD-10-CM | POA: Diagnosis not present

## 2023-12-25 DIAGNOSIS — Z1211 Encounter for screening for malignant neoplasm of colon: Secondary | ICD-10-CM | POA: Diagnosis not present

## 2023-12-25 LAB — COLOGUARD: Cologuard: NEGATIVE

## 2023-12-28 ENCOUNTER — Telehealth: Payer: Self-pay | Admitting: Cardiology

## 2023-12-28 ENCOUNTER — Encounter: Payer: Self-pay | Admitting: Cardiology

## 2023-12-28 DIAGNOSIS — E7800 Pure hypercholesterolemia, unspecified: Secondary | ICD-10-CM | POA: Diagnosis not present

## 2023-12-28 DIAGNOSIS — I10 Essential (primary) hypertension: Secondary | ICD-10-CM | POA: Diagnosis not present

## 2023-12-28 LAB — LIPID PANEL

## 2023-12-28 NOTE — Telephone Encounter (Signed)
 Received critical lab page about CMP drawn today. K 5.8 (not hemolyzed), no Cr resulted yet. Tried calling patient phone numbers x5 without success in getting in contact with the patient (phone has been turned off). Then attempted to call patient's emergency contact, Marcey [name], and was able to get in contact with him. He confirmed the phone numbers I had been trying to reach Zachary [name]. I let Marcey know that we had some critical labs resulted that we needed to communicate with Zachary. Marcey will attempt to get in contact with Zachary in the morning as well to let him know that his cardiology team is trying to get in contact with him. Will CC his cardiologist, Dr. Pietro, to let him know what's going on and to have the clinic attempt reaching out to Zachary [name] in the morning as well.  Depending on what the Cr results, may need to come into the hospital to bring his K down urgently, otherwise could possibly manage as an outpatient by holding his losartan  and rechecking labs in a few days.   Curtistine LITTIE Farr, MD Cardiology 12/28/2023

## 2023-12-29 ENCOUNTER — Ambulatory Visit: Payer: Self-pay | Admitting: Cardiology

## 2023-12-29 DIAGNOSIS — E875 Hyperkalemia: Secondary | ICD-10-CM

## 2023-12-29 LAB — COMPREHENSIVE METABOLIC PANEL WITH GFR
ALT: 22 IU/L (ref 0–44)
AST: 28 IU/L (ref 0–40)
Albumin: 4.2 g/dL (ref 3.8–4.9)
Alkaline Phosphatase: 91 IU/L (ref 44–121)
BUN/Creatinine Ratio: 11 (ref 9–20)
BUN: 13 mg/dL (ref 6–24)
Bilirubin Total: 0.4 mg/dL (ref 0.0–1.2)
CO2: 21 mmol/L (ref 20–29)
Calcium: 10.1 mg/dL (ref 8.7–10.2)
Chloride: 100 mmol/L (ref 96–106)
Creatinine, Ser: 1.15 mg/dL (ref 0.76–1.27)
Globulin, Total: 2.1 g/dL (ref 1.5–4.5)
Glucose: 74 mg/dL (ref 70–99)
Potassium: 5.8 mmol/L (ref 3.5–5.2)
Sodium: 134 mmol/L (ref 134–144)
Total Protein: 6.3 g/dL (ref 6.0–8.5)
eGFR: 77 mL/min/1.73 (ref >59)

## 2023-12-29 LAB — LIPID PANEL
Cholesterol, Total: 96 mg/dL — AB (ref 100–199)
HDL: 46 mg/dL (ref >39)
LDL CALC COMMENT:: 2.1 ratio (ref 0.0–5.0)
LDL Chol Calc (NIH): 30 mg/dL (ref 0–99)
Triglycerides: 107 mg/dL (ref 0–149)
VLDL Cholesterol Cal: 20 mg/dL (ref 5–40)

## 2023-12-29 LAB — LIPOPROTEIN A (LPA): Lipoprotein (a): 194.8 nmol/L — AB (ref <75.0)

## 2023-12-29 NOTE — Telephone Encounter (Signed)
 I received sign-out from fellow regarding his receipt of elevated potassium level notification and difficulty reaching patient. See his note below about speaking with patient's friend. I also tried to call patient. The [phone] number just goes directly to VM, LMTCB. The 202 home phone number did pick up but said this was a wrong number. Will route to Dr. Alfornia Conquest to continue to try when office re-opens.

## 2023-12-29 NOTE — Telephone Encounter (Signed)
 Spoke with pt, he is very upset because his emergency contact was called at midnight. He is 4 days out from an MI and he is not happy. Explained why elevated potassium is urgent. The patiuent said he is going to stop his potassium pill and hung up.

## 2023-12-29 NOTE — Telephone Encounter (Signed)
 Patient is returning call.

## 2023-12-30 ENCOUNTER — Ambulatory Visit (HOSPITAL_COMMUNITY): Admitting: Licensed Clinical Social Worker

## 2023-12-30 DIAGNOSIS — F4310 Post-traumatic stress disorder, unspecified: Secondary | ICD-10-CM

## 2023-12-30 DIAGNOSIS — F332 Major depressive disorder, recurrent severe without psychotic features: Secondary | ICD-10-CM | POA: Diagnosis not present

## 2023-12-30 NOTE — Progress Notes (Signed)
 THERAPIST PROGRESS NOTE  Session Date: 12/30/2023  Session Time: 1007 - 1106 Virtual Visit via Video Note  I connected with Zachary [name] on 12/30/23 at 10:00 AM EDT by a video enabled telemedicine application and verified that I am speaking with the correct person using two identifiers.  Location: Patient: Home Provider: Home Office   I discussed the limitations of evaluation and management by telemedicine and the availability of in person appointments. The patient expressed understanding and agreed to proceed.  The patient was advised to call back or seek an in-person evaluation if the symptoms worsen or if the condition fails to improve as anticipated.  I provided 59 minutes of non-face-to-face time during this encounter.  Participation Level: Active  Behavioral Response: CasualAlertDysphoric  Type of Therapy: Individual Therapy   Treatment Goals addressed:  Progressing (4) LTG: Increase coping skills to manage depression and improve ability to perform daily activities (OP Depression) STG: Zachary [name] will identify cognitive patterns and beliefs that support depression (OP Depression) STG: Zachary [name] will identify situations, thoughts, and feelings that trigger internal anger, and/or angry/aggressive actions as evidenced by self-report (Anger Management) LTG: Ensure it doesn't impact anyone else (Anger Management)  Not Progressing (2) LTG: Reduce frequency, intensity, and duration of depression symptoms so that daily functioning is improved (OP Depression) LTG: Improving my mindset (OP Depression)  ProgressTowards Goals: Progressing  Interventions: CBT, Motivational Interviewing, Supportive, and Reframing  Summary: Zachary [name] is a 53 y.o. male with past psych history of MDD and PTSD, presenting for follow-up therapy session in efforts to improve management of depressive symptoms.   Patient actively engaged throughout session, presenting in depressed moods, and congruent affect  throughout visit, engaging in check-in, sharing of things overall being Okay for the most part', further detailing of having had labs done Wednesday having been ordered by cardiology, and having reached out near midnight to pt, and due to inability to reach pt, having contacted emergency contact due to concerns. Pt shared of having connected with office and addressed concerns, proving to be no significant concern or emergency at this time. Explored past work week, noting of things continuing to progress. Engaged in processing how pt's depressive and anxious sxs have proven to impact pt over the past week, processing the dichotomy between the peace experienced with solitude versus the overall loneliness that pt also proves to experience, identifying thoughts surrounding the likelihood of pt finding a partner at his current age. Actively explored cognitive distortions and how these prove to impact one's view of self, others, situations, and the world as a whole, based primarily on life events and experiences, proving to have negative implications on outlook and perspectives. Utilized examples of various different cognitive distortions that prove to shape individuals perspectives and outlooks, exploring pts individual awareness of such irrational thought patterns.   Patient responded well to interventions. Patient continues to meet criteria for MDD and PTSD. Patient will continue to benefit from engagement in outpatient therapy due to being the least restrictive service to meet presenting needs.      12/23/2023   10:27 AM 11/25/2023    9:07 AM 11/11/2023    9:09 AM 10/28/2023    9:10 AM  GAD 7 : Generalized Anxiety Score  Nervous, Anxious, on Edge 2 1 1 1   Control/stop worrying 2 1 1 1   Worry too much - different things 1 1 1 1   Trouble relaxing 2 2 2 1   Restless 0 0 1 0  Easily annoyed or irritable 2 2 1  1  Afraid - awful might happen 1 0 1 0  Total GAD 7 Score 10 7 8 5   Anxiety Difficulty Somewhat  difficult Somewhat difficult Somewhat difficult Very difficult      12/23/2023   11:01 AM 12/20/2023    2:52 PM 11/25/2023    9:09 AM 11/11/2023    9:07 AM 10/28/2023    9:12 AM  Depression screen PHQ 2/9  Decreased Interest 2 0 1 1 1   Down, Depressed, Hopeless 2 0 1 1 1   PHQ - 2 Score 4 0 2 2 2   Altered sleeping 2  2 2 1   Tired, decreased energy 2  2 2 2   Change in appetite 0   0 0  Feeling bad or failure about yourself  2  0 1 1  Trouble concentrating 2  1 1 1   Moving slowly or fidgety/restless 0  0 0 0  Suicidal thoughts 1  0 0 0  PHQ-9 Score 13  7 8 7   Difficult doing work/chores Very difficult  Somewhat difficult Very difficult Somewhat difficult   Flowsheet Row Counselor from 12/23/2023 in [facility] from 07/14/2023 in [facility] from 04/21/2023 in BEHAVIORAL HEALTH PARTIAL HOSPITALIZATION PROGRAM  C-SSRS RISK CATEGORY Moderate Risk High Risk High Risk   Suicidal/Homicidal: Yeswithout intent/plan; pSI occasional thoughts of feeling burdensome.  Therapist Response: Clinician utilized CBT, MI, Solution focused and supportive reflection techniques to address presenting challenges.   Clinician openly greeted pt upon joining virtual visit, assessing presenting moods and affect. Actively engaged pt in check-in, utilizing open ended questions in aims of eliciting pt's recounts of events of the past week, observed stress or challenges, and factors contributing to poor moods. Utilized active listening techniques in supporting pt in exploration of recounts of events, providing support and validation for pt's identified concerns in relation to experienced stress. Utilized psychoed, CBT, and solution focused techniques in efforts of supporting pt in processing negative thoughts and distorted thought patterns that prove to reinforce negative perspectives and/or outlooks. Utilized socratic  questions in prompting greater critical processing of negative thoughts and pt's perspectives of the implications on overall outlook.  Clinician reassessed severity of presenting sxs, and presence of any safety concerns. Clinician provided support and empathy to patient during session.  Homework: Explore DAV support groups to increase social connections.  Plan: Return again in 1 weeks.  Diagnosis:  Encounter Diagnoses  Name Primary?   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  Patient/Guardian was advised Release of Information must be obtained prior to any record release in order to collaborate their care with an outside provider. Patient/Guardian was advised if they have not already done so to contact the registration department to sign all necessary forms in order for us  to release information regarding their care.   Consent: Patient/Guardian gives verbal consent for treatment and assignment of benefits for services provided during this visit. Patient/Guardian expressed understanding and agreed to proceed.   Lynwood JONETTA Maris, MSW, LCSW 12/30/2023,  10:08 AM

## 2024-01-01 LAB — COLOGUARD: COLOGUARD: NEGATIVE

## 2024-01-03 ENCOUNTER — Ambulatory Visit: Payer: Self-pay | Admitting: Family Medicine

## 2024-01-06 ENCOUNTER — Ambulatory Visit (HOSPITAL_COMMUNITY): Admitting: Licensed Clinical Social Worker

## 2024-01-06 ENCOUNTER — Encounter: Payer: Self-pay | Admitting: *Deleted

## 2024-01-06 ENCOUNTER — Telehealth: Payer: Self-pay | Admitting: Urology

## 2024-01-06 DIAGNOSIS — F4310 Post-traumatic stress disorder, unspecified: Secondary | ICD-10-CM

## 2024-01-06 DIAGNOSIS — F332 Major depressive disorder, recurrent severe without psychotic features: Secondary | ICD-10-CM

## 2024-01-06 NOTE — Progress Notes (Unsigned)
 THERAPIST PROGRESS NOTE  Session Date: 01/06/2024  Session Time: 0908 - 1009 Virtual Visit via Video Note  I connected with [name] on 01/06/24 at  9:00 AM EDT by a video enabled telemedicine application and verified that I am speaking with the correct person using two identifiers.  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 The patient expressed understanding and agreed to proceed.  The patient was advised to call back or seek an in-person evaluation if the symptoms worsen or if the condition fails to improve as anticipated.  I provided 61 minutes of non-face-to-face time during this encounter.  Participation Level: Active  Behavioral Response: CasualAlertDysphoric  Type of Therapy: Individual Therapy   Treatment Goals addressed:  Progressing (4) LTG: Increase coping skills to manage depression and improve ability to perform daily activities (OP Depression) STG: Jaevon will identify cognitive patterns and beliefs that support depression (OP Depression) STG: Revere will identify situations, thoughts, and feelings that trigger internal anger, and/or angry/aggressive actions as evidenced by self-report (Anger Management) LTG: Ensure it doesn't impact anyone else (Anger Management)  Not Progressing (2) LTG: Reduce frequency, intensity, and duration of depression symptoms so that daily functioning is improved (OP Depression) LTG: Improving my mindset (OP Depression)  ProgressTowards Goals: Progressing  Interventions: CBT, Motivational Interviewing, Supportive, and Reframing  Summary: Zubayr is a 53 y.o. male with past psych history of MDD and PTSD, presenting for follow-up therapy session in efforts to improve management of depressive symptoms.   Patient actively engaged throughout session, presenting in depressed moods, and flat  affect throughout visit, engaging in check-in, sharing of things overall being generally the same, being on call this week so currently in office at work. Pt shared of upcoming [facility] with [facility] in attempts to increase disability rating, processing details and supports provided by clinician regarding information in relation to availability of support personnel within [place] to aid in navigating process. Actively engaged in reflection of information provided to [place] thus var, exploring medical factors and further processing details surrounding PTSD, exploring ways in which pt can recall trauma occurring in [employer] to have had lasting implications on life post-discharge. Explored pt's perspectives surrounding 10-20 year period following discharge from [employer], processing pt's thoughts and feelings surrounding overall dislike for self, feelings of unworthiness based on reflection of individual actions/behaviors, and processing individual accountability for actions, believing factors to have contributed to pt's current life and feeling for life to have been generally unfulfilling. Actively processed pt's perspectives surrounding trauma, how pt proved to navigate trauma during earlier parts of decade following, and how this proved to impact pt's behaviors and interactions towards self and others. Processed implications on marriage, and exploring ways in which trauma proved to impact ways in which pt engaged in relationship, and the emotional dysregulation that proved to impact all aspects of pt's life. Explored pt's ongoing efforts to forgive self for previous behaviors and treatment towards self and others, noting of inability to forget how unhealthy pt had been in the past. Explored ways in which pt observed anger become increasingly problematic in relation to hx of trauma, reflecting on anger being secondary to primary feelings surrounding traumatic experiences, processing importance of allowing self grace and abilities to heal  from pain.  Patient responded well to interventions. Patient continues to meet criteria for MDD and PTSD. Patient will continue to benefit from engagement in outpatient therapy due  to being the least restrictive service to meet presenting needs.      12/23/2023   10:27 AM 11/25/2023    9:07 AM 11/11/2023    9:09 AM 10/28/2023    9:10 AM  GAD 7 : Generalized Anxiety Score  Nervous, Anxious, on Edge 2 1 1 1   Control/stop worrying 2 1 1 1   Worry too much - different things 1 1 1 1   Trouble relaxing 2 2 2 1   Restless 0 0 1 0  Easily annoyed or irritable 2 2 1 1   Afraid - awful might happen 1 0 1 0  Total GAD 7 Score 10 7 8 5   Anxiety Difficulty Somewhat difficult Somewhat difficult Somewhat difficult Very difficult      12/23/2023   11:01 AM 12/20/2023    2:52 PM 11/25/2023    9:09 AM 11/11/2023    9:07 AM 10/28/2023    9:12 AM  Depression screen PHQ 2/9  Decreased Interest 2 0 1 1 1   Down, Depressed, Hopeless 2 0 1 1 1   PHQ - 2 Score 4 0 2 2 2   Altered sleeping 2  2 2 1   Tired, decreased energy 2  2 2 2   Change in appetite 0   0 0  Feeling bad or failure about yourself  2  0 1 1  Trouble concentrating 2  1 1 1   Moving slowly or fidgety/restless 0  0 0 0  Suicidal thoughts 1  0 0 0  PHQ-9 Score 13  7 8 7   Difficult doing work/chores Very difficult  Somewhat difficult Very difficult Somewhat difficult   Flowsheet Row Counselor from 12/23/2023 in [facility] from 07/14/2023 in [facility] at [facility] from 04/21/2023 in BEHAVIORAL HEALTH PARTIAL HOSPITALIZATION PROGRAM  C-SSRS RISK CATEGORY Moderate Risk High Risk High Risk   Suicidal/Homicidal: Nowithout intent/plan;  Therapist Response: Clinician utilized CBT, MI, Solution focused and supportive reflection techniques to address presenting challenges.   Clinician openly greeted pt upon joining virtual visit, assessing presenting moods and  affect. Actively engaged pt in check-in, utilizing open ended questions in aims of eliciting pt's recounts of the past week, individual efforts at navigating challenges, and factors contributing to presenting moods. Utilized active listening techniques in efforts of supporting pt in exploration of recent events, providing support and validation for pt's identified concerns in relation to continued stressors. Utilized socratic questions in prompting greater critical processing of negative thoughts and pt's perspectives of the implications on overall outlook. Utilized psychoed, CBT, and solution focused techniques to support pt in processing negative thoughts and distorted thought patterns that prove to reinforce negative perspectives and/or outlooks. Clinician reassessed severity of presenting sxs, and presence of any safety concerns. Clinician provided support and empathy to patient during session. Pt proves to maintain minimal progress towards identified goals.  Homework: Explore DAV support groups to increase social connections.  Plan: Return again in 2 weeks.  Diagnosis:  Encounter Diagnoses  Name Primary?   PTSD (post-traumatic stress disorder) Yes   Severe episode of recurrent major depressive disorder, without psychotic features (HCC)      Collaboration of Care: Other none necessary at this time.  Patient/Guardian was advised Release of Information must be obtained prior to any record release in order to collaborate their care with an outside provider. Patient/Guardian was advised if they have not already done so to contact the registration department to sign all necessary forms in order for us   to release information regarding their care.   Consent: Patient/Guardian gives verbal consent for treatment and assignment of benefits for services provided during this visit. Patient/Guardian expressed understanding and agreed to proceed.   Lynwood JONETTA Maris, MSW, LCSW 01/06/2024,  9:13 AM

## 2024-01-06 NOTE — Telephone Encounter (Signed)
 Called pt to get more information for his apt he was really upset that I was calling him and asking him if he has ever seen a urologist and where. Please look into this for me.

## 2024-01-10 ENCOUNTER — Other Ambulatory Visit: Payer: Self-pay | Admitting: Family Medicine

## 2024-01-10 DIAGNOSIS — K219 Gastro-esophageal reflux disease without esophagitis: Secondary | ICD-10-CM

## 2024-01-10 DIAGNOSIS — F325 Major depressive disorder, single episode, in full remission: Secondary | ICD-10-CM

## 2024-01-12 ENCOUNTER — Other Ambulatory Visit: Payer: Self-pay

## 2024-01-12 DIAGNOSIS — K219 Gastro-esophageal reflux disease without esophagitis: Secondary | ICD-10-CM

## 2024-01-12 DIAGNOSIS — Z0189 Encounter for other specified special examinations: Secondary | ICD-10-CM | POA: Diagnosis not present

## 2024-01-12 MED ORDER — ESOMEPRAZOLE MAGNESIUM 40 MG PO CPDR: 40.0000 mg | DELAYED_RELEASE_CAPSULE | Freq: Every day | ORAL | 3 refills | Status: DC

## 2024-01-13 ENCOUNTER — Ambulatory Visit (HOSPITAL_COMMUNITY)

## 2024-01-16 ENCOUNTER — Encounter (HOSPITAL_COMMUNITY): Payer: Self-pay

## 2024-01-17 DIAGNOSIS — M47816 Spondylosis without myelopathy or radiculopathy, lumbar region: Secondary | ICD-10-CM | POA: Diagnosis not present

## 2024-01-17 DIAGNOSIS — M5459 Other low back pain: Secondary | ICD-10-CM | POA: Diagnosis not present

## 2024-01-17 DIAGNOSIS — M7918 Myalgia, other site: Secondary | ICD-10-CM | POA: Diagnosis not present

## 2024-01-20 ENCOUNTER — Other Ambulatory Visit: Payer: Self-pay | Admitting: Family Medicine

## 2024-01-20 ENCOUNTER — Ambulatory Visit (HOSPITAL_COMMUNITY): Admitting: Licensed Clinical Social Worker

## 2024-01-20 DIAGNOSIS — F332 Major depressive disorder, recurrent severe without psychotic features: Secondary | ICD-10-CM | POA: Diagnosis not present

## 2024-01-20 DIAGNOSIS — F4310 Post-traumatic stress disorder, unspecified: Secondary | ICD-10-CM | POA: Diagnosis not present

## 2024-01-20 DIAGNOSIS — G8929 Other chronic pain: Secondary | ICD-10-CM

## 2024-01-20 NOTE — Progress Notes (Signed)
 THERAPIST PROGRESS NOTE  Session Date: 01/20/2024  Session Time: 0903 - 1003 Virtual Visit via Video Note  I connected with Zachary [name] on 01/20/24 at  9:00 AM EDT by a video enabled telemedicine application and verified that I am speaking with the correct person using two identifiers.  Location: Patient: Home Provider: Home Office   I discussed the limitations of evaluation and management by telemedicine and the availability of in person appointments. The patient expressed understanding and agreed to proceed.  The patient was advised to call back or seek an in-person evaluation if the symptoms worsen or if the condition fails to improve as anticipated.  I provided 60 minutes of non-face-to-face time during this encounter.  Participation Level: Active  Behavioral Response: CasualAlertDysphoric  Type of Therapy: Individual Therapy   Treatment Goals addressed:  Progressing (4) LTG: Increase coping skills to manage depression and improve ability to perform daily activities (OP Depression) STG: [name] will identify cognitive patterns and beliefs that support depression (OP Depression) STG: [name] will identify situations, thoughts, and feelings that trigger internal anger, and/or angry/aggressive actions as evidenced by self-report (Anger Management) LTG: Ensure it doesn't impact anyone else (Anger Management)  Not Progressing (2) LTG: Reduce frequency, intensity, and duration of depression symptoms so that daily functioning is improved (OP Depression) LTG: Improving my mindset (OP Depression)  ProgressTowards Goals: Progressing  Interventions: CBT, Motivational Interviewing, Supportive, and Reframing  Summary: Zachary [name] is a 53 y.o. male with past psych history of MDD and PTSD, presenting for follow-up therapy session in efforts to improve management of depressive symptoms.   Patient actively engaged throughout session, presenting in depressed moods, and flat affect  throughout visit, engaging in check-in, sharing of Doing okay, further detailing of having to have canceled [facility] appt from last week, and dealing with needing to reschedule multiple other appts. Further sharing stress surrounding VA process and having to be reassessed for PTSD rating next week, proving to be concerning due to having only received PTSD rating approx. 7 mo. Ago. Processed resource of VA Benefit support via [employer] office, providing pt with link to access support and schedule appt to aid in navigating disability process. Actively engaged in reassessing presenting depressive and anxious sxs, processing variances in screening scores, noting of no significant improvements in sxs and continued stress in areas surrounding [place] process. Pt shared to Just feel tired, mentally and physically, engaging in further reflection and processing sleep challenges, identifying difficulties getting brain to shut off, feeling brain goes off on tangent regarding prior events and challenges pt has encountered throughout hx. Engaged in exploration of utilizing journaling in order to aid in emptying mind in aims of supporting greater relaxation.   Patient responded well to interventions. Patient continues to meet criteria for MDD and PTSD. Patient will continue to benefit from engagement in outpatient therapy due to being the least restrictive service to meet presenting needs.      01/20/2024    9:22 AM 12/23/2023   10:27 AM 11/25/2023    9:07 AM 11/11/2023    9:09 AM  GAD 7 : Generalized Anxiety Score  Nervous, Anxious, on Edge 2 2 1 1   Control/stop worrying 1 2 1 1   Worry too much - different things 0 1 1 1   Trouble relaxing 2 2 2 2   Restless 0 0 0 1  Easily annoyed or irritable 2 2 2 1   Afraid - awful might happen 1 1 0 1  Total GAD 7 Score 8 10 7  8  Anxiety Difficulty Very difficult Somewhat difficult Somewhat difficult Somewhat difficult      01/20/2024    9:24 AM 12/23/2023   11:01 AM 12/20/2023     2:52 PM 11/25/2023    9:09 AM 11/11/2023    9:07 AM  Depression screen PHQ 2/9  Decreased Interest 1 2 0 1 1  Down, Depressed, Hopeless 1 2 0 1 1  PHQ - 2 Score 2 4 0 2 2  Altered sleeping 2 2  2 2   Tired, decreased energy 2 2  2 2   Change in appetite 0 0   0  Feeling bad or failure about yourself  1 2  0 1  Trouble concentrating 1 2  1 1   Moving slowly or fidgety/restless 0 0  0 0  Suicidal thoughts 0 1  0 0  PHQ-9 Score 8 13  7 8   Difficult doing work/chores Somewhat difficult Very difficult  Somewhat difficult Very difficult   Flowsheet Row Counselor from 12/23/2023 in [facility] from 07/14/2023 in [facility] from 04/21/2023 in BEHAVIORAL HEALTH PARTIAL HOSPITALIZATION PROGRAM  C-SSRS RISK CATEGORY Moderate Risk High Risk High Risk   Suicidal/Homicidal: Nowithout intent/plan;  Therapist Response: Clinician utilized CBT, MI, Solution focused and supportive reflection techniques to address presenting challenges.   Clinician openly greeted pt upon joining virtual visit, assessing presenting moods and affect. Actively engaged pt in check-in, utilizing open ended questions in eliciting recounts of the past two weeks, individual efforts at navigating challenges, and factors contributing to presenting moods. Utilized active listening techniques in supporting pt in exploration of recent events, providing support and validation for pt's identified stressors surrounding ongoing challenges surrounding [facility] disability process. Reassessed presenting depressive and anxious sxs via PHQ-9 and GAD-7, further eliciting pt's thoughts and perspectives surrounding observed sxs and variances in such over recent weeks. Utilized psychoed, CBT, and solution focused techniques to support pt in processing challenges surrounding sleep and how sleep difficulties further impact various other factors. Clinician  reassessed severity of presenting sxs, and presence of any safety concerns. Clinician provided support and empathy to patient during session. Pt proves to maintain minimal progress towards identified goals.  Homework: Begin exploring utilizing journaling at night in efforts to aid in emptying mind and improving relaxation in order to improve sleep.   Plan: Return again in 1 weeks.  Diagnosis:  Encounter Diagnoses  Name Primary?   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  Patient/Guardian was advised Release of Information must be obtained prior to any record release in order to collaborate their care with an outside provider. Patient/Guardian was advised if they have not already done so to contact the registration department to sign all necessary forms in order for us  to release information regarding their care.   Consent: Patient/Guardian gives verbal consent for treatment and assignment of benefits for services provided during this visit. Patient/Guardian expressed understanding and agreed to proceed.   Lynwood JONETTA Maris, MSW, LCSW 01/20/2024,  9:26 AM

## 2024-01-23 ENCOUNTER — Ambulatory Visit (HOSPITAL_COMMUNITY)
Admission: RE | Admit: 2024-01-23 | Discharge: 2024-01-23 | Disposition: A | Discharge status: Home / Self Care | Source: Ambulatory Visit | Attending: Cardiology | Admitting: Cardiology

## 2024-01-23 DIAGNOSIS — I2510 Atherosclerotic heart disease of native coronary artery without angina pectoris: Secondary | ICD-10-CM | POA: Insufficient documentation

## 2024-01-23 MED ORDER — PERFLUTREN LIPID MICROSPHERE
1.0000 mL | INTRAVENOUS | Status: AC | PRN
  Administered 2024-01-23: 3 mL via INTRAVENOUS

## 2024-01-24 ENCOUNTER — Ambulatory Visit: Admitting: Family Medicine

## 2024-01-24 LAB — ECHOCARDIOGRAM COMPLETE
AR max vel: 4.06 cm2
AV Area VTI: 3.59 cm2
AV Area mean vel: 3.62 cm2
AV Mean grad: 4 mmHg
AV Peak grad: 6.5 mmHg
Ao pk vel: 1.27 m/s
Area-P 1/2: 3.06 cm2
S' Lateral: 3.4 cm

## 2024-01-27 ENCOUNTER — Ambulatory Visit (HOSPITAL_COMMUNITY): Admitting: Licensed Clinical Social Worker

## 2024-01-27 ENCOUNTER — Other Ambulatory Visit: Payer: Self-pay

## 2024-01-27 DIAGNOSIS — F332 Major depressive disorder, recurrent severe without psychotic features: Secondary | ICD-10-CM

## 2024-01-27 DIAGNOSIS — F4310 Post-traumatic stress disorder, unspecified: Secondary | ICD-10-CM | POA: Diagnosis not present

## 2024-01-27 MED ORDER — CARVEDILOL 6.25 MG PO TABS: 6.2500 mg | ORAL_TABLET | Freq: Two times a day (BID) | ORAL | 0 refills | Status: DC

## 2024-01-27 NOTE — Telephone Encounter (Signed)
 Noted. Rx was sent on 01/27/2024

## 2024-01-27 NOTE — Progress Notes (Signed)
 THERAPIST PROGRESS NOTE  Session Date: 01/27/2024  Session Time: 0901 - 1001 Virtual Visit via Video Note  I connected with [name] on 01/27/24 at  9:00 AM EDT by a video enabled telemedicine application and verified that I am speaking with the correct person using two identifiers.  Location: Patient: Home Provider: Home Office   I discussed the limitations of evaluation and management by telemedicine and the availability of in person appointments. The patient expressed understanding and agreed to proceed.  The patient was advised to call back or seek an in-person evaluation if the symptoms worsen or if the condition fails to improve as anticipated.  I provided 60 minutes of non-face-to-face time during this encounter.  Participation Level: Active  Behavioral Response: CasualAlertDysphoric  Type of Therapy: Individual Therapy   Treatment Goals addressed:  Progressing (4) LTG: Increase coping skills to manage depression and improve ability to perform daily activities (OP Depression) STG: Michelangelo will identify cognitive patterns and beliefs that support depression (OP Depression) STG: Tariq will identify situations, thoughts, and feelings that trigger internal anger, and/or angry/aggressive actions as evidenced by self-report (Anger Management) LTG: Ensure it doesn't impact anyone else (Anger Management)  Not Progressing (2) LTG: Reduce frequency, intensity, and duration of depression symptoms so that daily functioning is improved (OP Depression) LTG: Improving my mindset (OP Depression)  ProgressTowards Goals: Progressing  Interventions: CBT, Motivational Interviewing, Supportive, and Reframing  Summary: Ilya is a 53 y.o. male with past psych history of MDD and PTSD, presenting for follow-up therapy session in efforts to improve management of depressive symptoms.   Patient actively engaged throughout session, presenting in depressed moods, and flat affect  throughout visit, engaging in check-in, sharing of It's going, further detailing of working having not been bad, and having to schedule multiple [facility] appts over the coming weeks, detailing of re-eval for PTSD next Wednesday, a C&P exam in [place], audio screening in [place], and follow up with PCP the following week. Pt further detailed of all upcoming visits being for assessing VA secondary disability ratings for Tinnitus, headaches and migraines, arthritis, gout, GERD, ED, heart attack, Hemorrhoids due to VA having approved PTSD rating and denying all secondary when initially submitting in June 2024. Identified benefits of achieving greater disability rating to further support financial needs and other limitations. Reviewed recent echocardiogram, believing findings to be of no concern. Explored moods, processing spending a lot of time alone and experiencing regular loneliness, combating by spending a lot of time reading or being ut on back deck, and dichotomy of not wanting to be around people. Engaged in exploration of other areas of interest as well as interest in exploring dating, identifying varying factors contributing to pt's lack of interest in dating or pursuing relationships due to not wanting to be burdened with other's baggage and various aspects of individuals lives that prove to impact romantic relationships, I.e. kids, grand kids, and experiences from previous relationships.  Patient responded well to interventions. Patient continues to meet criteria for MDD and PTSD. Patient will continue to benefit from engagement in outpatient therapy due to being the least restrictive service to meet presenting needs.      01/20/2024    9:22 AM 12/23/2023   10:27 AM 11/25/2023    9:07 AM 11/11/2023    9:09 AM  GAD 7 : Generalized Anxiety Score  Nervous, Anxious, on Edge 2 2 1 1   Control/stop worrying 1 2 1 1   Worry too much - different things 0 1 1 1  Trouble relaxing 2 2 2 2   Restless 0 0 0 1   Easily annoyed or irritable 2 2 2 1   Afraid - awful might happen 1 1 0 1  Total GAD 7 Score 8 10 7 8   Anxiety Difficulty Very difficult Somewhat difficult Somewhat difficult Somewhat difficult      01/20/2024    9:24 AM 12/23/2023   11:01 AM 12/20/2023    2:52 PM 11/25/2023    9:09 AM 11/11/2023    9:07 AM  Depression screen PHQ 2/9  Decreased Interest 1 2 0 1 1  Down, Depressed, Hopeless 1 2 0 1 1  PHQ - 2 Score 2 4 0 2 2  Altered sleeping 2 2  2 2   Tired, decreased energy 2 2  2 2   Change in appetite 0 0   0  Feeling bad or failure about yourself  1 2  0 1  Trouble concentrating 1 2  1 1   Moving slowly or fidgety/restless 0 0  0 0  Suicidal thoughts 0 1  0 0  PHQ-9 Score 8 13  7 8   Difficult doing work/chores Somewhat difficult Very difficult  Somewhat difficult Very difficult   Flowsheet Row Counselor from 12/23/2023 in [facility] from 07/14/2023 in [facility] from 04/21/2023 in [facility]PITALIZATION PROGRAM  C-SSRS RISK CATEGORY Moderate Risk High Risk High Risk   Suicidal/Homicidal: Nowithout intent/plan;  Therapist Response: Clinician utilized CBT, MI, Solution focused and supportive reflection techniques to address presenting challenges.   Clinician openly greeted pt upon joining virtual visit, assessing presenting moods and affect, engaging in introductory check-in, utilizing open-ended questions in evoking patient recounts of events of the past week, presenting stress, and factors proving to impact observed moods.  Utilized active listening techniques discussed the potential and supporting patient in exploring recounts of recent events, providing support and validation for identified thoughts and associated feelings in relation to ongoing stressors surrounding VA disability process and increased number of medical appointments over the coming weeks,  proving to require patient to miss increased amounts of time from work. Utilized psychoed, CBT, solution focused, and supportive reflection techniques to aid pt in processing challenges surrounding stressors and challenging feelings. Clinician reassessed severity of presenting sxs, and presence of any safety concerns. Clinician provided support and empathy to patient during session. Pt proves to maintain minimal progress towards identified goals.   [x]  Cognitive Challenging     []  Cognitive Refocusing     [x]  Cognitive Reframing []  Communication Skills []  Compliance Issues     []  DBT Past [x]  Exploration of Coping Patterns     [x]  Exploration of Emotions     [x]  Exploration of Relationship Patterns []  Guided Imagery     []  Interactive Feedback     [x]  Interpersonal Resolutions []  Mindfulness Training     []  Preventative Services     [x]  Psycho-Education []  Relaxation/Deep Breathing []  Review of Treatment Plan/Progress     []  Role-Play/Behavioral Rehearsal []  Structured Problem Solving     [x]  Supportive Reflection []  Symptom Management     []  Other  Homework: Begin exploring utilizing journaling at night in efforts to aid in emptying mind and improving relaxation in order to improve sleep.   Plan: Return again in 1 weeks.  Diagnosis:  Encounter Diagnoses  Name Primary?   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  Patient/Guardian was advised Release of Information must be obtained prior to any record release in order to collaborate their care with an outside provider. Patient/Guardian was advised if they have not already done so to contact the registration department to sign all necessary forms in order for us  to release information regarding their care.   Consent: Patient/Guardian gives verbal consent for treatment and assignment of  benefits for services provided during this visit. Patient/Guardian expressed understanding and agreed to proceed.   Lynwood JONETTA Maris, MSW, LCSW 01/27/2024,  8:59 AM

## 2024-01-29 ENCOUNTER — Encounter: Payer: Self-pay | Admitting: Family Medicine

## 2024-02-01 ENCOUNTER — Other Ambulatory Visit: Payer: Self-pay | Admitting: Medical Genetics

## 2024-02-03 ENCOUNTER — Ambulatory Visit (INDEPENDENT_AMBULATORY_CARE_PROVIDER_SITE_OTHER): Admitting: Licensed Clinical Social Worker

## 2024-02-03 DIAGNOSIS — F4310 Post-traumatic stress disorder, unspecified: Secondary | ICD-10-CM | POA: Diagnosis not present

## 2024-02-03 DIAGNOSIS — F332 Major depressive disorder, recurrent severe without psychotic features: Secondary | ICD-10-CM

## 2024-02-03 NOTE — Progress Notes (Signed)
 THERAPIST PROGRESS NOTE  Session Date: 02/03/2024  Session Time: 0907 - 1002 Virtual Visit via Video Note  I connected with [name] on 02/03/24 at  9:00 AM EDT by a video enabled telemedicine application and verified that I am speaking with the correct person using two identifiers.  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 The patient expressed understanding and agreed to proceed.  The patient was advised to call back or seek an in-person evaluation if the symptoms worsen or if the condition fails to improve as anticipated.  I provided 54 minutes of non-face-to-face time during this encounter.  Participation Level: Active  Behavioral Response: CasualAlertDysphoric  Type of Therapy: Individual Therapy   Treatment Goals addressed:  Progressing (4) LTG: Increase coping skills to manage depression and improve ability to perform daily activities (OP Depression) STG: Mickeal will identify cognitive patterns and beliefs that support depression (OP Depression) STG: Finbar will identify situations, thoughts, and feelings that trigger internal anger, and/or angry/aggressive actions as evidenced by self-report (Anger Management) LTG: Ensure it doesn't impact anyone else (Anger Management)  Not Progressing (2) LTG: Reduce frequency, intensity, and duration of depression symptoms so that daily functioning is improved (OP Depression) LTG: Improving my mindset (OP Depression)  ProgressTowards Goals: Progressing  Interventions: CBT, Motivational Interviewing, Supportive, and Reframing  Summary: Shun is a 53 y.o. male with past psych history of MDD and PTSD, presenting for follow-up therapy session in efforts to improve management of depressive symptoms.   Patient actively engaged throughout session, presenting in depressed moods, and flat  affect throughout visit, engaging in check-in, sharing of Just trying to get over this cold, further detailing morning and daily events, being on call. Further reflected on recent PTSD re-eval with VA on Wednesday, sharing of experience being very sterile, finding to have had impact on moods throughout the remainder of Wednesday and into Thursday, finding comfort and stability in home environment. Further explored pt's stress surrounding filing process and waiting for updates regarding determinations. Explored ongoing sleep difficulties surrounding pt's chronic hx of <6-8hr/night of broken sleep and challenges falling asleep due to brain ot turning off, processing efforts towards journaling prior to sleeping and any observed benefits, noting of inconsistency in journaling. Further explored challenges and experiencing of negative thoughts while attempting to get to sleep, processing cognitive triangle and the abilities of challenging negative thoughts prior to impacting feelings and ruminating on thoughts. Explored questions within 'Challenging Negative Thoughts' technique, and processed improved benefits through regular practice. Processed pt's overall feeling of unrewarding life, exploring personal factors as well as global factors that prove to impact pt's moods and outlook.  Patient responded well to interventions. Patient continues to meet criteria for MDD and PTSD. Patient will continue to benefit from engagement in outpatient therapy due to being the least restrictive service to meet presenting needs.      01/20/2024    9:22 AM 12/23/2023   10:27 AM 11/25/2023    9:07 AM 11/11/2023    9:09 AM  GAD 7 : Generalized Anxiety Score  Nervous, Anxious, on Edge 2 2 1 1   Control/stop worrying 1 2 1 1   Worry too much - different things 0 1 1 1   Trouble relaxing 2 2 2 2   Restless 0 0 0 1  Easily annoyed or irritable 2 2 2 1   Afraid - awful might happen 1 1 0 1  Total GAD 7 Score 8 10 7 8   Anxiety  Difficulty Very difficult Somewhat difficult Somewhat difficult Somewhat difficult      01/20/2024    9:24 AM 12/23/2023   11:01 AM 12/20/2023    2:52 PM 11/25/2023    9:09 AM 11/11/2023    9:07 AM  Depression screen PHQ 2/9  Decreased Interest 1 2 0 1 1  Down, Depressed, Hopeless 1 2 0 1 1  PHQ - 2 Score 2 4 0 2 2  Altered sleeping 2 2  2 2   Tired, decreased energy 2 2  2 2   Change in appetite 0 0   0  Feeling bad or failure about yourself  1 2  0 1  Trouble concentrating 1 2  1 1   Moving slowly or fidgety/restless 0 0  0 0  Suicidal thoughts 0 1  0 0  PHQ-9 Score 8 13  7 8   Difficult doing work/chores Somewhat difficult Very difficult  Somewhat difficult Very difficult   Flowsheet Row Counselor from 12/23/2023 in [facility] at [facility] from 07/14/2023 in [facility] at [facility] from 04/21/2023 in BEHAVIORAL HEALTH PARTIAL HOSPITALIZATION PROGRAM  C-SSRS RISK CATEGORY Moderate Risk High Risk High Risk   Suicidal/Homicidal: Nowithout intent/plan;  Therapist Response: Clinician utilized CBT, MI, Solution focused and supportive reflection techniques to address presenting challenges.   Clinician openly greeted pt upon joining virtual visit, assessing presenting moods and affect, acknowledging pt's presenting moods and briefly engaging in exploration of contributing factors. Engaged in introductory check-in, utilizing open-ended questions in evoking recounts of events of the past week, newly identified stressors, recurring stressors, factors impact pt's moods, and individual means of navigating stressors. Utilized active listening techniques in supporting patient in exploring recent and recurring stressors, providing support and validation of pt's identified thoughts, feelings, and perspectives. Utilized psychoed, CBT, solution focused, and supportive reflection techniques to aid pt in processing challenges  surrounding stressors and challenging feelings, exploring how pt has and may prove to navigate challenges moving forward.  [x]  Cognitive Challenging [x]  Cognitive Refocusing [x]  Cognitive Reframing []  Communication Skills []  Compliance Issues []  DBT [x]  Exploration of Coping Patterns [x]  Exploration of Emotions []  Exploration of Relationship Patterns []  Guided Imagery []  Interactive Feedback []  Interpersonal Resolutions []  Mindfulness Training []  Preventative Services [x]  Psycho-Education []  Relaxation/Deep Breathing []  Review of Treatment Plan/Progress []  Role-Play/Behavioral Rehearsal []  Structured Problem Solving []  Supportive Reflection []  Symptom Management []  Other  Clinician reassessed severity of presenting sxs, and presence of any safety concerns. Pt proves to maintain minimal progress towards identified goals.   Homework: Continue exploring utilizing journaling at night in efforts to aid in sleep.   Plan: Return again in 2 weeks.  Diagnosis:  Encounter Diagnoses  Name Primary?   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  Patient/Guardian was advised Release of Information must be obtained prior to any record release in order to collaborate their care with an outside provider. Patient/Guardian was advised if they have not already done so to contact the registration department to sign all necessary forms in order for us  to release information regarding their care.   Consent: Patient/Guardian gives verbal consent for treatment and assignment of benefits for services provided during this visit. Patient/Guardian expressed understanding and agreed to proceed.   Lynwood JONETTA Maris, MSW,  LCSW 02/03/2024,  9:08 AM

## 2024-02-07 ENCOUNTER — Encounter: Payer: Self-pay | Admitting: Family Medicine

## 2024-02-07 ENCOUNTER — Ambulatory Visit: Admitting: Family Medicine

## 2024-02-07 VITALS — BP 106/67 | HR 55 | Temp 97.7°F | Resp 18 | Wt 226.4 lb

## 2024-02-07 DIAGNOSIS — F332 Major depressive disorder, recurrent severe without psychotic features: Secondary | ICD-10-CM

## 2024-02-07 DIAGNOSIS — M545 Low back pain, unspecified: Secondary | ICD-10-CM

## 2024-02-07 DIAGNOSIS — I5022 Chronic systolic (congestive) heart failure: Secondary | ICD-10-CM | POA: Diagnosis not present

## 2024-02-07 DIAGNOSIS — G8929 Other chronic pain: Secondary | ICD-10-CM

## 2024-02-07 DIAGNOSIS — Z6833 Body mass index (BMI) 33.0-33.9, adult: Secondary | ICD-10-CM

## 2024-02-07 DIAGNOSIS — R001 Bradycardia, unspecified: Secondary | ICD-10-CM | POA: Diagnosis not present

## 2024-02-07 DIAGNOSIS — E66811 Obesity, class 1: Secondary | ICD-10-CM | POA: Diagnosis not present

## 2024-02-07 DIAGNOSIS — I10 Essential (primary) hypertension: Secondary | ICD-10-CM

## 2024-02-07 DIAGNOSIS — E6609 Other obesity due to excess calories: Secondary | ICD-10-CM

## 2024-02-07 DIAGNOSIS — M542 Cervicalgia: Secondary | ICD-10-CM

## 2024-02-07 DIAGNOSIS — F4310 Post-traumatic stress disorder, unspecified: Secondary | ICD-10-CM

## 2024-02-07 DIAGNOSIS — I2510 Atherosclerotic heart disease of native coronary artery without angina pectoris: Secondary | ICD-10-CM

## 2024-02-07 DIAGNOSIS — F411 Generalized anxiety disorder: Secondary | ICD-10-CM

## 2024-02-07 MED ORDER — PREGABALIN 75 MG PO CAPS: ORAL_CAPSULE | ORAL | 1 refills | Status: DC

## 2024-02-07 MED ORDER — NAPROXEN 500 MG PO TABS: 500.0000 mg | ORAL_TABLET | Freq: Two times a day (BID) | ORAL | 1 refills | Status: DC

## 2024-02-07 MED ORDER — DULOXETINE HCL 60 MG PO CPEP: 120.0000 mg | ORAL_CAPSULE | Freq: Every day | ORAL | 3 refills | Status: DC

## 2024-02-07 MED ORDER — CARVEDILOL 3.125 MG PO TABS: 6.2500 mg | ORAL_TABLET | Freq: Two times a day (BID) | ORAL | 0 refills | Status: DC

## 2024-02-07 NOTE — Patient Instructions (Signed)
  VISIT SUMMARY: Today, we reviewed your mental health, back pain, osteoarthritis, heart condition, weight management, and other health concerns. We made some adjustments to your medications and discussed new strategies to help manage your symptoms.  YOUR PLAN: RECURRENT MAJOR DEPRESSIVE DISORDER AND PTSD: Your depression and PTSD are being managed with medications and therapy. Your mood has been stable, but your PHQ score has increased slightly. -Increase duloxetine  to 120 mg daily. -Continue mirtazapine  7.5 mg at night. -Continue behavioral therapy for depression. -Consider pregabalin for additional anxiety and pain management.  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 -Increase duloxetine  to 120 mg daily for pain management. -Start pregabalin 75 mg twice daily, with potential increase to 150 mg after one week if tolerated. -Refer to physical therapy. -Prescribe naproxen for breakthrough pain, with caution due to your heart condition.  OSTEOARTHRITIS OF CERVICAL SPINE AND SHOULDERS: You have osteoarthritis in your neck and shoulders, causing intermittent pain. -Start pregabalin 75 mg twice daily, with potential increase to 150 mg after one week if tolerated.  CHRONIC HEART FAILURE WITH MILDLY REDUCED EJECTION FRACTION: You have chronic heart failure with a mildly reduced ejection fraction, mild bradycardia, and low blood pressure. -Reduce carvedilol  from 6.25 mg to 3.125 mg twice daily. -Monitor heart rate and blood pressure after dose adjustment.  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 -Continue aspirin  81 mg daily. -Monitor cardiac symptoms and follow up with cardiology as needed.  ESSENTIAL HYPERTENSION: Your high blood  pressure is well-controlled with medication, but your readings are on the lower end. -Continue losartan  12.5 mg daily. -Reduce carvedilol  from 6.25 mg to 3.125 mg twice daily.  OBESITY (BMI 33): You have obesity with a BMI of 33 and have lost about 30 pounds on tirzepatide . -Continue tirzepatide  4 mg weekly. -Encourage continued physical activity and weight management.

## 2024-02-07 NOTE — Telephone Encounter (Signed)
 Noted; letter was given to patient while in clinic for OV

## 2024-02-07 NOTE — Progress Notes (Signed)
 Assessment & Plan   Assessment/Plan:     Assessment and Plan Assessment & Plan Recurrent major depressive disorder, severe, without psychotic features, GAD, and post-traumatic stress disorder Recurrent major depressive disorder with severe symptoms, no psychotic features, and comorbid anxiety and PTSD. PHQ score increased from 8 to 11 over the past two months. Anxiety symptoms have decreased slightly. Currently on duloxetine  60 mg daily and mirtazapine  7.5 mg at night. No significant side effects reported from cross-titration from escitalopram  to duloxetine . Mood remains unchanged, but back pain has slightly improved. - Increase duloxetine  to 120 mg daily to address mood and pain symptoms. - Continue mirtazapine  7.5 mg at night. - Continue behavioral therapy for depression. - Adjunct for mood and pain, initiate pregabalin 75 mg twice daily, with potential increase to 150 mg after one week if tolerated.  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 Pain varies with activity level, exacerbated by physical exertion. Previous imaging by [facility] confirmed lumbar spondylosis. Current treatment includes duloxetine , which has provided slight improvement in pain. Muscle relaxants like Flexeril  and Robaxin have been ineffective. - Increase duloxetine  to 120 mg daily for pain management. - Initiate pregabalin 75 mg twice daily, with potential increase to 150 mg after one week if tolerated. - Refer to physical therapy as recommended by [facility]. - Prescribe naproxen 500 mg BID prn for breakthrough pain, with caution due to cardiac history.  Osteoarthritis of cervical spine and shoulders Osteoarthritis affecting the cervical spine and shoulders, causing intermittent pain and discomfort. Previous treatments with muscle relaxants were ineffective. Gabapentin was  previously tried without significant benefit. - Initiate pregabalin 75 mg twice daily, with potential increase to 150 mg after one week if tolerated. - Increase duloxetine  to 120 mg daily to address mood and pain symptoms.  Chronic heart failure with mildly reduced ejection fraction with Hypotension and Bradycardia Chronic heart failure with mildly reduced ejection fraction (45-50%). Symptoms include mild bradycardia w/ HR as low at 51 bpm and low blood pressure 90s/60s, likely exacerbated by carvedilol . Experiences lightheadedness and shortness of breath with exertion. - Reduce carvedilol  from 6.25 mg to 3.125 mg twice daily to address bradycardia and hypotension. - Monitor heart rate and blood pressure after dose adjustment.  Coronary artery disease with prior STEMI and atherosclerosis Coronary artery disease with prior STEMI and atherosclerosis. Currently on aspirin  81 mg daily for antiplatelet therapy. Brilinta  was discontinued as per cardiology's recommendation after one year of use. - Continue aspirin  81 mg daily. - Monitor cardiac symptoms and follow up with cardiology as needed.  Essential hypertension Essential hypertension well-controlled today with losartan  and carvedilol . Blood pressure readings are on the lower end, necessitating adjustment of carvedilol  dosage. - Continue losartan  12.5 mg daily. - Reduce carvedilol  from 6.25 mg to 3.125 mg twice daily.  Obesity (BMI 33) Obesity with a BMI of 33. He has lost approximately 30 pounds since starting tirzepatide . Continues to engage in physical activity, though reports limited benefit from stationary biking. - Continue tirzepatide  15 mg IM weekly. - Encourage continued physical activity and weight management.       Medications Discontinued During This Encounter  Medication Reason   carvedilol  (COREG ) 6.25 MG tablet    ticagrelor  (BRILINTA ) 90 MG TABS tablet    Melatonin 5 MG CAPS    DULoxetine  (CYMBALTA ) 30 MG capsule     carvedilol  (COREG ) 6.25 MG tablet Reorder  Return in about 1 month (around 03/09/2024) for mood, pain, BP and HR .        Subjective:   Encounter date: 02/07/2024  Zachary [name] is a 53 y.o. male who has GERD (gastroesophageal reflux disease); Hemorrhoids; Obesity; Primary hypertension; Bilateral leg edema; Gout; CAD (coronary atherosclerotic disease); History of MI (myocardial infarction); Tobacco use disorder; STEMI involving left anterior descending coronary artery [facility]); MDD (major depressive disorder), recurrent episode, severe (HCC); PTSD (post-traumatic stress disorder); Low TSH level; Chronic heart failure with mildly reduced ejection fraction (HFmrEF, 41-49%) (HCC); GAD (generalized anxiety disorder); Chronic midline low back pain without sciatica; and Cervicalgia on their problem list..   He  has a past medical history of Anxiety, Coronary artery disease, GERD (gastroesophageal reflux disease), Hyperlipidemia, Hypertension, and Myocardial infarction (HCC).SABRA   He presents with chief complaint of MOOD (2 month follow up. Pt is requesting letter of necessity that is needed for smart watch (FSA) //HM due- vaccinations; cologuard was done on 12/25/2023 negative ) and Back Pain (Pt seen [facility] on 01/17/2024 for lower back pain. Pt stated pain is still present' currently taking robaxin 500MG  ) .   Discussed the use of AI scribe software for clinical note transcription with the patient, who gave verbal consent to proceed.  History of Present Illness 12/20/2023  Zachary [name] is a 53 year old male with coronary artery disease who presents with ongoing back pain.  He has been experiencing back pain for over five years, which has worsened despite a significant weight loss of approximately 30 pounds. The pain is described as a constant burning sensation in the middle of his back, particularly when standing for extended periods. There is no radiation of pain to other areas. He  has tried various home exercises, stretches, and Tylenol  without relief.  He has a history of obesity and has been on tirzepatide  15 mg weekly, which has contributed to his weight loss. Despite the weight loss, his back pain has not improved. 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  He has a history of coronary artery disease and a past STEMI, for which he takes Brilinta  90 mg twice daily. He reports that he cannot take NSAIDs like Celebrex or Aleve and has not found relief with Tylenol .  He is currently on escitalopram  30 mg for mood management and occasionally uses mirtazapine . He has not had any imaging studies for his back pain and has not been to [facility] since 2023.  02/07/2024  ARRINGTON YOHE is a 53 year old male with major depressive disorder, PTSD, and coronary artery disease who presents for follow-up on his mental health and cardiovascular conditions.  Depressive and anxiety symptoms - Major depressive disorder and PTSD under ongoing management with psychotherapy. - Previously on escitalopram , discontinued due to back pain. - Currently on duloxetine , increased from 30 mg to 60 mg daily, and mirtazapine  7.5 mg nightly. - PHQ score increased from 8 to 11 over the past two months. - Anxiety score has decreased slightly. - Depressive symptoms remain stable since medication change.  Chronic back pain - Chronic back pain with variable intensity, worsened by activity such as putting away laundry. - Muscle relaxants (Flexeril , Robaxin) provided no significant relief. - Lumbar spondylosis and lower disc disease identified on x-ray from [facility]. - Physical therapy not yet initiated.  Osteoarthritis and cervical radiculopathy - Osteoarthritis affecting multiple joints, including the neck. - Neck pain occasionally radiates to chest  and arm. - Gabapentin previously trialed without significant relief.  Coronary artery disease and cardiac  function - History of ST elevation myocardial infarction and atherosclerosis. - No longer on ticagrelor ; currently taking aspirin  81 mg daily. - Echocardiogram shows left ventricular ejection fraction of 45-50%.  Obesity and weight management - Obesity with current BMI of 33. - On tirzepatide  4 mg weekly, resulting in approximately 30 pounds of weight loss.  Bradycardia and hypotension - Mild bradycardia with heart rate of 56 bpm. - Blood pressure measured at 106/67 mmHg in clinic; home readings typically low, with lowest at 85/55 mmHg. - On carvedilol  6.25 mg daily for blood pressure management.  Exertional lightheadedness and dyspnea - Episodes of lightheadedness and shortness of breath, particularly with exertion or standing quickly. - Uses stationary bike for exercise without significant improvement in symptoms.     02/07/2024    3:31 PM 01/20/2024    9:24 AM 12/23/2023   11:01 AM 12/20/2023    2:52 PM 11/25/2023    9:09 AM  Depression screen PHQ 2/9  Decreased Interest 1 1 2  0 1  Down, Depressed, Hopeless 2 1 2  0 1  PHQ - 2 Score 3 2 4  0 2  Altered sleeping 3 2 2  2   Tired, decreased energy 2 2 2  2   Change in appetite 0 0 0    Feeling bad or failure about yourself  2 1 2   0  Trouble concentrating 1 1 2  1   Moving slowly or fidgety/restless 0 0 0  0  Suicidal thoughts 0 0 1  0  PHQ-9 Score 11 8 13  7   Difficult doing work/chores Somewhat difficult Somewhat difficult Very difficult  Somewhat difficult      02/07/2024    3:32 PM 01/20/2024    9:22 AM 12/23/2023   10:27 AM 11/25/2023    9:07 AM  GAD 7 : Generalized Anxiety Score  Nervous, Anxious, on Edge 0 2 2 1   Control/stop worrying 1 1 2 1   Worry too much - different things 1 0 1 1  Trouble relaxing 2 2 2 2   Restless 0 0 0 0  Easily annoyed or irritable 2 2 2 2   Afraid - awful might happen 0 1 1 0  Total GAD 7 Score 6 8 10 7   Anxiety Difficulty Somewhat difficult Very difficult Somewhat difficult Somewhat difficult        ROS  Past Surgical History:  Procedure Laterality Date   CARDIAC CATHETERIZATION     HEMORRHOID SURGERY N/A 09/13/2014   Procedure: HEMORRHOIDECTOMY;  Surgeon: Unknown Sharps, MD;  Location: [facility] ORS;  Service: General;  Laterality: N/A;   TONSILLECTOMY      Outpatient Medications Prior to Visit  Medication Sig Dispense Refill   allopurinol  (ZYLOPRIM ) 100 MG tablet Take 1 tablet (100 mg total) by mouth daily. 90 tablet 3   Ascorbic Acid (VITAMIN C) 1000 MG tablet      aspirin  81 MG chewable tablet Chew 1 tablet (81 mg total) by mouth daily. 90 tablet 3   atorvastatin  (LIPITOR) 80 MG tablet Take 1 tablet (80 mg total) by mouth at bedtime. 90 tablet 3   Cholecalciferol (D3-1000) 25 MCG (1000 UT) capsule      cyclobenzaprine  (FLEXERIL ) 5 MG tablet TAKE ONE TABLET BY MOUTH THREE TIMES A DAY AS NEEDED FOR MUSCLE SPASM 30 tablet 0   esomeprazole  (NEXIUM ) 40 MG capsule Take 1 capsule (40 mg total) by mouth daily. 30 capsule 3  furosemide  (LASIX ) 20 MG tablet Take 1 tablet (20 mg total) by mouth daily as needed. 90 tablet 3   losartan  (COZAAR ) 25 MG tablet Take 0.5 tablets (12.5 mg total) by mouth daily. 90 tablet 3   methocarbamol (ROBAXIN) 500 MG tablet Take 1 tablet twice a day by oral route as needed for 15 days.     mirtazapine  (REMERON ) 7.5 MG tablet Take 1 tablet (7.5 mg total) by mouth at bedtime. 60 tablet 1   Multiple Vitamins-Minerals (CENTRUM SILVER 50+MEN PO)      tirzepatide  (ZEPBOUND ) 15 MG/0.5ML Pen Inject 15 mg into the skin once a week. 6 mL 3   carvedilol  (COREG ) 6.25 MG tablet Take 1 tablet (6.25 mg total) by mouth 2 (two) times daily with a meal. 180 tablet 0   DULoxetine  (CYMBALTA ) 30 MG capsule Take 1 capsule (30 mg) by mouth daily for 14 days, THEN 2 capsules (60 mg) daily 60 capsule 1   carvedilol  (COREG ) 6.25 MG tablet Take by mouth.     Melatonin 5 MG CAPS Take 1 capsule (5 mg total) by mouth at bedtime. 90 capsule 3   ticagrelor  (BRILINTA ) 90 MG TABS tablet  Take 1 tablet (90 mg total) by mouth 2 (two) times daily. 180 tablet 3   No facility-administered medications prior to visit.    Family History  Problem Relation Age of Onset   Depression Mother    Alcohol abuse Mother    Alcohol abuse Maternal Grandfather    Heart disease Maternal Grandfather 61   Heart attack Maternal Grandfather    Heart failure Maternal Grandfather    Alcohol abuse Paternal Grandfather    Bipolar disorder Cousin     Social History   Socioeconomic History   Marital status: Divorced    Spouse name: Not on file   Number of children: 0   Years of education: Not on file   Highest education level: Some college, no degree  Occupational History   Not on file  Tobacco Use   Smoking status: Every Day    Types: Cigars    Passive exposure: Current   Smokeless tobacco: Never   Tobacco comments:    Pt smoke 1-2 cigars per day         5cigars weekly  Vaping Use   Vaping status: Never Used  Substance and Sexual Activity   Alcohol use: Yes    Comment: occasionally   Drug use: No   Sexual activity: Yes  Other Topics Concern   Not on file  Social History Narrative   Not on file   Social Drivers of Health   Financial Resource Strain: Low Risk  (12/16/2023)   Overall Financial Resource Strain (CARDIA)    Difficulty of Paying Living Expenses: Not hard at all  Food Insecurity: No Food Insecurity (12/16/2023)   Hunger Vital Sign    Worried About Running Out of Food in the Last Year: Never true    Ran Out of Food in the Last Year: Never true  Transportation Needs: No Transportation Needs (12/16/2023)   PRAPARE - Administrator, Civil Service (Medical): No    Lack of Transportation (Non-Medical): No  Physical Activity: Inactive (12/16/2023)   Exercise Vital Sign    Days of Exercise per Week: 0 days    Minutes of Exercise per Session: Not on file  Stress: Stress Concern Present (12/16/2023)   [employer] of Occupational Health - Occupational  Stress Questionnaire    Feeling of Stress: Very  much  Social Connections: Moderately Isolated (12/16/2023)   Social Connection and Isolation Panel    Frequency of Communication with Friends and Family: Three times a week    Frequency of Social Gatherings with Friends and Family: Twice a week    Attends Religious Services: Never    Database administrator or Organizations: Yes    Attends Engineer, structural: More than 4 times per year    Marital Status: Divorced  Catering manager Violence: Not on file                                                                                                  Objective:  Physical Exam: BP 106/67 (BP Location: Left Arm, Patient Position: Sitting, Cuff Size: Large)   Pulse (!) 55   Temp 97.7 F (36.5 C) (Temporal)   Resp 18   Wt 226 lb 6.4 oz (102.7 kg)   SpO2 99%   BMI 33.92 kg/m   Wt Readings from Last 3 Encounters:  02/07/24 226 lb 6.4 oz (102.7 kg)  12/20/23 227 lb (103 kg)  12/07/23 222 lb (100.7 kg)    Physical Exam GENERAL: Alert, cooperative, well developed, no acute distress. HEENT: Normocephalic, normal oropharynx, moist mucous membranes. CHEST: Clear to auscultation bilaterally, no wheezes, rhonchi, or crackles. Lungs normal. CARDIOVASCULAR: Normal heart rate and rhythm, S1 and S2 normal without murmurs. ABDOMEN: Soft, non-tender, non-distended, without organomegaly, normal bowel sounds. EXTREMITIES: No cyanosis or edema. MUSCULOSKELETAL: No tenderness on palpation of the back. NEUROLOGICAL: Cranial nerves grossly intact, moves all extremities without gross motor or sensory deficit.   VITALS: P- 56, BP- 106/67 MEASUREMENTS: Weight- down 1 pound from last visit, BMI- 33.0. GENERAL: Alert, cooperative, well developed, no acute distress HEENT: Normocephalic, normal oropharynx, moist mucous membranes CHEST: Clear to auscultation bilaterally, no wheezes, rhonchi, or crackles CARDIOVASCULAR: Normal heart rate and rhythm,  S1 and S2 normal without murmurs ABDOMEN: Soft, tender on palpation, non-distended, without organomegaly, normal bowel sounds MSK: Tenderness along midline cervical spine and onto posterior shoulder on left, normal ROM and strength and sensation of left upper extremity no lower back tenderness  NEUROLOGICAL: Cranial nerves grossly intact, moves all extremities without gross motor or sensory deficit   Physical Exam  ECHOCARDIOGRAM COMPLETE Result Date: 01/24/2024    ECHOCARDIOGRAM REPORT   Patient Name:   ABIMELEC GROCHOWSKI Date of Exam: 01/23/2024 Medical Rec #:  [id number]        Height:       68.5 in Accession #:    [id number]       Weight:       227.0 lb Date of Birth:  1970/10/21       BSA:          2.169 m Patient Age:    52 years         BP:           115/84 mmHg Patient Gender: M                HR:           53  bpm. Exam Location:  [address] Procedure: 2D Echo, Cardiac Doppler, Color Doppler and Intracardiac            Opacification Agent (Both Spectral and Color Flow Doppler were            utilized during procedure). Indications:    CAD I25.10  History:        Patient has prior history of Echocardiogram examinations.                 Previous Myocardial Infarction and stent; Risk                 Factors:Hypertension and Hyperlipidemia.  Sonographer:    Cherene Ravens RDCS Referring Phys: [id number] BRIAN S CRENSHAW IMPRESSIONS  1. Left ventricular ejection fraction, by estimation, is 45 to 50%. The left ventricle has mildly decreased function. The left ventricle demonstrates regional wall motion abnormalities (see scoring diagram/findings for description). There is moderate asymmetric left ventricular hypertrophy of the basal-septal segment. Left ventricular diastolic parameters were normal.  2. Right ventricular systolic function is normal. The right ventricular size is normal.  3. The mitral valve is normal in structure. No evidence of mitral valve regurgitation. No evidence of mitral stenosis.  4. The  aortic valve is normal in structure. Aortic valve regurgitation is not visualized. No aortic stenosis is present.  5. The inferior vena cava is normal in size with greater than 50% respiratory variability, suggesting right atrial pressure of 3 mmHg. FINDINGS  Left Ventricle: Left ventricular ejection fraction, by estimation, is 45 to 50%. The left ventricle has mildly decreased function. The left ventricle demonstrates regional wall motion abnormalities. Definity  contrast agent was given IV to delineate the left ventricular endocardial borders. The left ventricular internal cavity size was normal in size. There is moderate asymmetric left ventricular hypertrophy of the basal-septal segment. Left ventricular diastolic parameters were normal.  LV Wall Scoring: The apical septal segment is akinetic. Right Ventricle: The right ventricular size is normal. No increase in right ventricular wall thickness. Right ventricular systolic function is normal. Left Atrium: Left atrial size was normal in size. Right Atrium: Right atrial size was normal in size. Pericardium: There is no evidence of pericardial effusion. Mitral Valve: The mitral valve is normal in structure. No evidence of mitral valve regurgitation. No evidence of mitral valve stenosis. Tricuspid Valve: The tricuspid valve is normal in structure. Tricuspid valve regurgitation is not demonstrated. No evidence of tricuspid stenosis. Aortic Valve: The aortic valve is normal in structure. Aortic valve regurgitation is not visualized. No aortic stenosis is present. Aortic valve mean gradient measures 4.0 mmHg. Aortic valve peak gradient measures 6.5 mmHg. Aortic valve area, by VTI measures 3.59 cm. Pulmonic Valve: The pulmonic valve was normal in structure. Pulmonic valve regurgitation is trivial. No evidence of pulmonic stenosis. Aorta: The aortic root is normal in size and structure. Venous: The inferior vena cava is normal in size with greater than 50% respiratory  variability, suggesting right atrial pressure of 3 mmHg. IAS/Shunts: No atrial level shunt detected by color flow Doppler.  LEFT VENTRICLE PLAX 2D LVIDd:         5.20 cm   Diastology LVIDs:         3.40 cm   LV e' medial:    7.29 cm/s LV PW:         1.00 cm   LV E/e' medial:  7.4 LV IVS:        1.50 cm   LV e' lateral:  6.85 cm/s LVOT diam:     2.40 cm   LV E/e' lateral: 7.9 LV SV:         106 LV SV Index:   49 LVOT Area:     4.52 cm  RIGHT VENTRICLE RV Basal diam:  3.70 cm RV Mid diam:    3.10 cm RV S prime:     13.20 cm/s TAPSE (M-mode): 2.6 cm RVSP:           16.0 mmHg LEFT ATRIUM             Index        RIGHT ATRIUM LA diam:        3.60 cm 1.66 cm/m   RA Pressure: 3.00 mmHg LA Vol (A2C):   63.8 ml 29.40 ml/m LA Vol (A4C):   36.4 ml 16.81 ml/m LA Biplane Vol: 49.2 ml 22.69 ml/m  AORTIC VALVE AV Area (Vmax):    4.06 cm AV Area (Vmean):   3.62 cm AV Area (VTI):     3.59 cm AV Vmax:           127.00 cm/s AV Vmean:          87.400 cm/s AV VTI:            0.295 m AV Peak Grad:      6.5 mmHg AV Mean Grad:      4.0 mmHg LVOT Vmax:         114.00 cm/s LVOT Vmean:        69.900 cm/s LVOT VTI:          0.234 m LVOT/AV VTI ratio: 0.79  AORTA Ao Root diam: 3.70 cm Ao Asc diam:  3.40 cm MITRAL VALVE               TRICUSPID VALVE MV Area (PHT): 3.06 cm    TR Peak grad:   13.0 mmHg MV Decel Time: 248 msec    TR Vmax:        180.00 cm/s MV E velocity: 54.00 cm/s  Estimated RAP:  3.00 mmHg MV A velocity: 60.80 cm/s  RVSP:           16.0 mmHg MV E/A ratio:  0.89                            SHUNTS                            Systemic VTI:  0.23 m                            Systemic Diam: 2.40 cm Oneil Parchment MD Electronically signed by Oneil Parchment MD Signature Date/Time: 01/24/2024/7:14:01 AM    Final     Recent Results (from the past 2160 hours)  Cologuard     Status: None   Collection Time: 12/25/23 12:00 AM  Result Value Ref Range   Cologuard Negative Negative    Comment: see note in CE  Cologuard     Status:  None   Collection Time: 12/25/23 10:48 AM  Result Value Ref Range   COLOGUARD Negative Negative    Comment: The Cologuard (TM) test was performed on this specimen.  NEGATIVE TEST RESULT. A negative Cologuard result indicates a low likelihood that a colorectal cancer (CRC) or advanced adenoma (adenomatous polyps with more advanced pre-malignant features) is  present. The chance that a person with a negative Cologuard test has a colorectal cancer is less than 1 in 1500 (negative predictive value >99.9%) or has an advanced adenoma is less than 5.3% (negative predictive value 94.7%). These data are based on a prospective cross-sectional study of 10,000 individuals at average risk for colorectal cancer who were screened with both Cologuard and colonoscopy. (Imperiale T. et al, N Engl J Med 2014;370(14):1286-1297) The normal value (reference range) for this assay is negative.  COLOGUARD RE-SCREENING RECOMMENDATION: Periodic colorectal cancer screening is an important part of preventive healthcare for asymptomatic individuals at average risk for colorectal cancer. Following a negative Cologuard  result, the American Cancer Society and U.S. Multi-Society Task Force screening guidelines recommend a Cologuard re-screening interval of 3 years.  References: American Cancer Society Guideline for Colorectal Cancer Screening: https://www.cancer.org/cancer/colon-rectal-cancer/detection-diagnosis-staging/acs-recommendations.html.; Rex DK, Boland CR, Dominitz JK, Colorectal Cancer Screening: Recommendations for Physicians and Patients from the U.S. Multi-Society Task Force on Colorectal Cancer Screening , Am J Gastroenterology 2017; 112:1016-1030.  TEST DESCRIPTION: Composite algorithmic analysis of stool DNA-biomarkers with hemoglobin immunoassay.   Quantitative values of individual biomarkers are not reportable and are not associated with individual biomarker result reference ranges. Cologuard is intended for colorectal  cancer screening of adults of either sex, 45 years or older, who are at average-risk for colorectal cancer (CRC). Cologuard has been approved for use by the U.S.  FDA. The performance of Cologuard was established in a cross sectional study of average-risk adults aged 83-84. Cologuard performance in patients ages 33 to 72 years was estimated by sub-group analysis of near-age groups. Colonoscopies performed for a positive result may find as the most clinically significant lesion: colorectal cancer [4.0%], advanced adenoma (including sessile serrated polyps greater than or equal to 1cm diameter) [20%] or non- advanced adenoma [31%]; or no colorectal neoplasia [45%]. These estimates are derived from a prospective cross-sectional screening study of 10,000 individuals at average risk for colorectal cancer who were screened with both Cologuard and colonoscopy. (Imperiale T. et al, LOISE [place] J Med 2014;370(14):1286-1297.) Cologuard may produce a false negative or false positive result (no colorectal cancer or precancerous polyp present at colonoscopy follow up). A negative Cologuard test result does not guarantee the absence of CRC or advanced adenoma  (pre-cancer). The current Cologuard screening interval is every 3 years. Science writer and [place] Therapist, music). Cologuard performance data in a 10,000 patient pivotal study using colonoscopy as the reference method can be accessed at the following location: www.[email or web address].com/results. Additional description of the Cologuard test process, warnings and precautions can be found at www.[email or web address].com.   Lipid panel     Status: Abnormal   Collection Time: 12/28/23  5:19 PM  Result Value Ref Range   Cholesterol, Total 96 (L) 100 - 199 mg/dL   Triglycerides 892 0 - 149 mg/dL   HDL 46 >60 mg/dL   VLDL Cholesterol Cal 20 5 - 40 mg/dL   LDL Chol Calc (NIH) 30 0 - 99 mg/dL   Chol/HDL Ratio 2.1 0.0 - 5.0 ratio    Comment:                                   T.  Chol/HDL Ratio  Men  Women                               1/2 Avg.Risk  3.4    3.3                                   Avg.Risk  5.0    4.4                                2X Avg.Risk  9.6    7.1                                3X Avg.Risk 23.4   11.0   Comprehensive Metabolic Panel (CMET)     Status: Abnormal   Collection Time: 12/28/23  5:19 PM  Result Value Ref Range   Glucose 74 70 - 99 mg/dL   BUN 13 6 - 24 mg/dL   Creatinine, Ser 8.84 0.76 - 1.27 mg/dL   eGFR 77 >40 fO/fpw/8.26   BUN/Creatinine Ratio 11 9 - 20   Sodium 134 134 - 144 mmol/L   Potassium 5.8 (HH) 3.5 - 5.2 mmol/L    Comment:                   Client Requested Flag   Chloride 100 96 - 106 mmol/L   CO2 21 20 - 29 mmol/L   Calcium  10.1 8.7 - 10.2 mg/dL   Total Protein 6.3 6.0 - 8.5 g/dL   Albumin 4.2 3.8 - 4.9 g/dL   Globulin, Total 2.1 1.5 - 4.5 g/dL   Bilirubin Total 0.4 0.0 - 1.2 mg/dL   Alkaline Phosphatase 91 44 - 121 IU/L   AST 28 0 - 40 IU/L   ALT 22 0 - 44 IU/L  Lipoprotein A (LPA)     Status: Abnormal   Collection Time: 12/28/23  5:19 PM  Result Value Ref Range   Lipoprotein (a) 194.8 (H) <75.0 nmol/L    Comment: Note:  Values greater than or equal to 75.0 nmol/L may        indicate an independent risk factor for CHD,        but must be evaluated with caution when applied        to non-Caucasian populations due to the        influence of genetic factors on Lp(a) across        ethnicities.   ECHOCARDIOGRAM COMPLETE     Status: None   Collection Time: 01/23/24  3:57 PM  Result Value Ref Range   S' Lateral 3.40 cm   AV Area VTI 3.59 cm2   AV Mean grad 4.0 mmHg   AV Area mean vel 3.62 cm2   Area-P 1/2 3.06 cm2   AR max vel 4.06 cm2   AV Peak grad 6.5 mmHg   Ao pk vel 1.27 m/s   Est EF 45 - 50%         Beverley Adine Hummer, MD, MS

## 2024-02-07 NOTE — Telephone Encounter (Signed)
 Here is a Physicist, medical.  Please put on letterhead.  Please provide to patient.  Date: 02/07/2024 To Whom It May Concern: Re: Medical Necessity for Smart Watch Patient: Zachary [name]. Tanda, DOB: Meriam DOB] I am writing to certify the medical necessity for Mr. Calvert to obtain a smart watch with the following capabilities:  Continuous heart rate monitoring Oxygen saturation (SpO?) tracking Sleep pattern analysis Electrocardiogram (ECG) functionality Fall detection alerts  Mr. Sherk is a 53 year old male with coronary artery disease, prior ST-elevation myocardial infarction, heart failure with mildly reduced ejection fraction (HFmrEF), primary hypertension, obesity, and major depressive disorder. Continuous monitoring of cardiovascular and physiologic parameters is essential for early detection of arrhythmias, hypoxemia, and sleep disturbances, which can worsen cardiac and mental health outcomes. ECG monitoring is critical for arrhythmia detection in CAD and HFmrEF patients, and fall detection mitigates injury risk given his chronic musculoskeletal pain and cardiovascular status. Monitoring sleep quality is clinically relevant as poor sleep worsens depression and increases cardiovascular risk, and may help identify patterns such as sleep apnea that exacerbate heart failure symptoms. This device is prescribed as part of the patient's ongoing treatment plan to improve cardiovascular monitoring, support guideline-directed therapy adherence, and reduce risk of hospitalization. The American Heart Association [facility]) states: "Continuous electrocardiographic monitoring is indicated for patients at risk of arrhythmia to enable timely detection and intervention." Wearable devices with ECG capability and physiologic monitoring align with these recommendations and provide real-time data that improve patient engagement and clinical decision-making. This proactive approach reduces emergency visits and hospitalizations  associated with cardiac and fall-related events, lowering overall healthcare costs. 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 For additional documentation, contact my office at [facility] Number]. Sincerely, [Your Name, MD] Family Medicine Physician [Clinic Name] [facility] Information]  Evidence Summary (Appendix) ECG Monitoring: CAD and HFmrEF patients have elevated arrhythmia risk; continuous ECG monitoring enables early detection and intervention, reducing adverse cardiac events (AHA guidelines). Fall Detection: Cardiovascular disease and chronic pain increase fall risk; automated alerts reduce time to emergency response and prevent complications. Sleep Monitoring: Poor sleep worsens depression and heart failure outcomes; tracking patterns supports comprehensive care and adherence.

## 2024-02-10 ENCOUNTER — Encounter: Payer: Self-pay | Admitting: Urology

## 2024-02-10 ENCOUNTER — Ambulatory Visit: Admitting: Urology

## 2024-02-10 VITALS — BP 113/75 | HR 53 | Ht 69.0 in | Wt 220.0 lb

## 2024-02-10 DIAGNOSIS — R399 Unspecified symptoms and signs involving the genitourinary system: Secondary | ICD-10-CM | POA: Diagnosis not present

## 2024-02-10 DIAGNOSIS — R3915 Urgency of urination: Secondary | ICD-10-CM | POA: Diagnosis not present

## 2024-02-10 DIAGNOSIS — R351 Nocturia: Secondary | ICD-10-CM | POA: Diagnosis not present

## 2024-02-10 DIAGNOSIS — R3911 Hesitancy of micturition: Secondary | ICD-10-CM

## 2024-02-10 DIAGNOSIS — R3912 Poor urinary stream: Secondary | ICD-10-CM | POA: Diagnosis not present

## 2024-02-10 DIAGNOSIS — R350 Frequency of micturition: Secondary | ICD-10-CM

## 2024-02-10 LAB — URINALYSIS, ROUTINE W REFLEX MICROSCOPIC
Bilirubin, UA: NEGATIVE
Glucose, UA: NEGATIVE
Ketones, UA: NEGATIVE
Leukocytes,UA: NEGATIVE
Nitrite, UA: NEGATIVE
Protein,UA: NEGATIVE
RBC, UA: NEGATIVE
Specific Gravity, UA: 1.015 (ref 1.005–1.030)
Urobilinogen, Ur: 1 mg/dL (ref 0.2–1.0)
pH, UA: 7.5 (ref 5.0–7.5)

## 2024-02-10 LAB — BLADDER SCAN AMB NON-IMAGING

## 2024-02-10 MED ORDER — ALFUZOSIN HCL ER 10 MG PO TB24: 10.0000 mg | ORAL_TABLET | Freq: Every day | ORAL | 11 refills | Status: AC

## 2024-02-10 NOTE — Progress Notes (Signed)
 Assessment: 1. Lower urinary tract symptoms      Plan: I personally reviewed the patient's chart including provider notes, lab results. PSA today. Trial of alfuzosin 10 mg daily for his lower urinary tract symptoms.  Prescription sent.  Use and side effects discussed. Return to office in 1 month for reevaluation.   Chief Complaint:  Chief Complaint  Patient presents with   LUTS    History of Present Illness:  Zachary [name] is a 53 y.o. male who is seen for evaluation of lower urinary tract symptoms. He reports lower urinary tract symptoms including frequency, urgency, nocturia x 4, intermittent stream, hesitancy.  His symptoms have been present for 2-3 years with some gradual worsening.  He reports that his symptoms are intermittent in nature.  No dysuria or gross hematuria.  No history of UTIs or prostatitis. IPSS = 19/5 No recent PSA.  Past Medical History:  Past Medical History:  Diagnosis Date   Anxiety    Coronary artery disease    GERD (gastroesophageal reflux disease)    Hyperlipidemia    Hypertension    Myocardial infarction [facility])     Past Surgical History:  Past Surgical History:  Procedure Laterality Date   CARDIAC CATHETERIZATION     HEMORRHOID SURGERY N/A 09/13/2014   Procedure: HEMORRHOIDECTOMY;  Surgeon: Unknown Sharps, MD;  Location: [facility] ORS;  Service: General;  Laterality: N/A;   TONSILLECTOMY      Allergies:  No Known Allergies  Family History:  Family History  Problem Relation Age of Onset   Depression Mother    Alcohol abuse Mother    Alcohol abuse Maternal Grandfather    Heart disease Maternal Grandfather 60   Heart attack Maternal Grandfather    Heart failure Maternal Grandfather    Alcohol abuse Paternal Grandfather    Bipolar disorder Cousin     Social History:  Social History   Tobacco Use   Smoking status: Every Day    Types: Cigars    Passive exposure: Current   Smokeless tobacco: Never   Tobacco comments:    Pt  smoke 1-2 cigars per day         5cigars weekly  Vaping Use   Vaping status: Never Used  Substance Use Topics   Alcohol use: Yes    Comment: occasionally   Drug use: No    Review of symptoms:  Constitutional:  Negative for unexplained weight loss, night sweats, fever, chills ENT:  Negative for nose bleeds, sinus pain, painful swallowing CV:  Negative for chest pain, shortness of breath, exercise intolerance, palpitations, loss of consciousness Resp:  Negative for cough, wheezing, shortness of breath GI:  Negative for nausea, vomiting, diarrhea, bloody stools GU:  Positives noted in HPI; otherwise negative for gross hematuria, dysuria, urinary incontinence Neuro:  Negative for seizures, poor balance, limb weakness, slurred speech Psych:  Negative for lack of energy, depression, anxiety Endocrine:  Negative for polydipsia, polyuria, symptoms of hypoglycemia (dizziness, hunger, sweating) Hematologic:  Negative for anemia, purpura, petechia, prolonged or excessive bleeding, use of anticoagulants  Allergic:  Negative for difficulty breathing or choking as a result of exposure to anything; no shellfish allergy; no allergic response (rash/itch) to materials, foods  Physical exam: BP 113/75   Pulse (!) 53   Ht 5' 9 (1.753 m)   Wt 220 lb (99.8 kg)   BMI 32.49 kg/m  GENERAL APPEARANCE:  Well appearing, well developed, well nourished, NAD HEENT: Atraumatic, Normocephalic, oropharynx clear. NECK: Supple without lymphadenopathy or thyromegaly.  LUNGS: Clear to auscultation bilaterally. HEART: Regular Rate and Rhythm without murmurs, gallops, or rubs. ABDOMEN: Soft, non-tender, No Masses. EXTREMITIES: Moves all extremities well.  Without clubbing, cyanosis, or edema. NEUROLOGIC:  Alert and oriented x 3, normal gait, CN II-XII grossly intact.  MENTAL STATUS:  Appropriate. BACK:  Non-tender to palpation.  No CVAT SKIN:  Warm, dry and intact.   GU: Penis:  circumcised Meatus:  Normal Scrotum: normal, no masses Testis: normal without masses bilateral Prostate: 40 g, nontender, no nodules Rectum: Normal tone,  no masses or tenderness   Results: U/A: Negative  PVR = 0 mL

## 2024-02-17 ENCOUNTER — Ambulatory Visit (INDEPENDENT_AMBULATORY_CARE_PROVIDER_SITE_OTHER): Admitting: Licensed Clinical Social Worker

## 2024-02-17 ENCOUNTER — Other Ambulatory Visit: Payer: Self-pay | Admitting: Family Medicine

## 2024-02-17 DIAGNOSIS — F4310 Post-traumatic stress disorder, unspecified: Secondary | ICD-10-CM

## 2024-02-17 DIAGNOSIS — F329 Major depressive disorder, single episode, unspecified: Secondary | ICD-10-CM | POA: Diagnosis not present

## 2024-02-17 DIAGNOSIS — F332 Major depressive disorder, recurrent severe without psychotic features: Secondary | ICD-10-CM

## 2024-02-17 NOTE — Progress Notes (Unsigned)
 THERAPIST PROGRESS NOTE  Session Date: 02/17/2024  Session Time: 0901 - 1004 Virtual Visit via Video Note  I connected with Zachary [name] on 02/17/24 at  9:00 AM EDT by a video enabled telemedicine application and verified that I am speaking with the correct person using two identifiers.  Location: Patient: Home Provider: Home Office   I discussed the limitations of evaluation and management by telemedicine and the availability of in person appointments. The patient expressed understanding and agreed to proceed.  The patient was advised to call back or seek an in-person evaluation if the symptoms worsen or if the condition fails to improve as anticipated.  I provided 63 minutes of non-face-to-face time during this encounter.  Participation Level: Active  Behavioral Response: CasualAlertDysphoric  Type of Therapy: Individual Therapy   Treatment Goals addressed:  Progressing (4) LTG: Increase coping skills to manage depression and improve ability to perform daily activities (OP Depression) STG: Zachary [name] will identify cognitive patterns and beliefs that support depression (OP Depression) STG: Zachary [name] will identify situations, thoughts, and feelings that trigger internal anger, and/or angry/aggressive actions as evidenced by self-report (Anger Management) LTG: Ensure it doesn't impact anyone else (Anger Management)  Not Progressing (2) LTG: Reduce frequency, intensity, and duration of depression symptoms so that daily functioning is improved (OP Depression) LTG: Improving my mindset (OP Depression)  ProgressTowards Goals: Progressing  Interventions: CBT, Motivational Interviewing, Supportive, and Reframing  Summary: Zachary [name] is a 53 y.o. male with past psych history of MDD and PTSD, presenting for follow-up therapy session in efforts to improve management of depressive symptoms.   Patient actively engaged throughout session, presenting in depressed moods, and flat affect  throughout visit, engaging in check-in, sharing of This week has been a bit of a challenge, further detailing of having received notice letter from [place] denying all new claims, and detailing no adjustment in rating. Processed potential of seeking support from Zachary [name] specific legal representation, and further explored potential benefits of appealing VA disability determinations and resubmitting claims individually, allowing each claim to process, instead of submitting 7-8 claims simultaneously to prevent potential for receiving repeated blanket denials. Processed pt's feelings of frustration surrounding overall process, and overall distrust for the system. Pt expressed further frustrations in relation to presenting political stress/conflict occurring that pt believes may prove to further delay processing due to delays with federal employee processing times.      02/07/2024    3:32 PM 01/20/2024    9:22 AM 12/23/2023   10:27 AM 11/25/2023    9:07 AM  GAD 7 : Generalized Anxiety Score  Nervous, Anxious, on Edge 0 2 2 1   Control/stop worrying 1 1 2 1   Worry too much - different things 1 0 1 1  Trouble relaxing 2 2 2 2   Restless 0 0 0 0  Easily annoyed or irritable 2 2 2 2   Afraid - awful might happen 0 1 1 0  Total GAD 7 Score 6 8 10 7   Anxiety Difficulty Somewhat difficult Very difficult Somewhat difficult Somewhat difficult      02/07/2024    3:31 PM 01/20/2024    9:24 AM 12/23/2023   11:01 AM 12/20/2023    2:52 PM 11/25/2023    9:09 AM  Depression screen PHQ 2/9  Decreased Interest 1 1 2  0 1  Down, Depressed, Hopeless 2 1 2  0 1  PHQ - 2 Score 3 2 4  0 2  Altered sleeping 3 2 2  2   Tired, decreased energy 2 2  2  2  Change in appetite 0 0 0    Feeling bad or failure about yourself  2 1 2   0  Trouble concentrating 1 1 2  1   Moving slowly or fidgety/restless 0 0 0  0  Suicidal thoughts 0 0 1  0  PHQ-9 Score 11 8 13  7   Difficult doing work/chores Somewhat difficult Somewhat difficult Very  difficult  Somewhat difficult   Flowsheet Row Counselor from 12/23/2023 in [facility] from 07/14/2023 in [facility] at [facility] from 04/21/2023 in BEHAVIORAL [facility] PARTIAL HOSPITALIZATION PROGRAM  C-SSRS RISK CATEGORY Moderate Risk High Risk High Risk   Suicidal/Homicidal: Nowithout intent/plan;  Therapist Response:  Clinician openly greeted pt upon joining virtual visit, assessing presenting moods and affect, exploring daily events, acknowledging presenting moods and exploring contributing factors.  Engaged in introductory check-in, utilizing open-ended questions in eliciting recounts of events of the past two weeks, newly presenting stressors, recurring stressors, impacts to pt's moods, and individual means of navigating stressors.  Utilized active listening techniques in supporting patient in exploring recent events and presenting stressors, providing support and validation of pt's identified thoughts, feelings, and perspectives.  Utilized psychoed, CBT, solution focused, and supportive reflection techniques to aid pt in processing challenges surrounding stressors and challenging feelings, exploring how pt has and may prove to navigate challenges moving forward.  Clinician reassessed severity of presenting sxs, and presence of any safety concerns.    [x]  Cognitive Challenging [x]  Cognitive Refocusing [x]  Cognitive Reframing []  Communication Skills []  Compliance Issues []  DBT [x]  Exploration of Coping Patterns [x]  Exploration of Emotions []  Exploration of Relationship Patterns []  Guided Imagery []  Interactive Feedback []  Interpersonal Resolutions []  Mindfulness Training []  Preventative Services [x]  Psycho-Education []  Relaxation/Deep Breathing []  Review of Treatment Plan/Progress []  Role-Play/Behavioral Rehearsal [x]  Structured Problem Solving [x]  Supportive Reflection []  Symptom Management []   Other  Patient responded well to interventions. Patient continues to meet criteria for MDD and PTSD. Patient will continue to benefit from engagement in outpatient therapy due to being the least restrictive service to meet presenting needs. Pt proves to maintain minimal progress towards identified goals.  Homework: None.   Plan: Return again in 2 weeks.  Diagnosis:  No diagnosis found.    Collaboration of Care: Other none necessary at this time.  Patient/Guardian was advised Release of Information must be obtained prior to any record release in order to collaborate their care with an outside provider. Patient/Guardian was advised if they have not already done so to contact the registration department to sign all necessary forms in order for us  to release information regarding their care.   Consent: Patient/Guardian gives verbal consent for treatment and assignment of benefits for services provided during this visit. Patient/Guardian expressed understanding and agreed to proceed.   Lynwood JONETTA Maris, MSW, LCSW 02/17/2024,  9:03 AM

## 2024-02-20 ENCOUNTER — Telehealth: Payer: Self-pay

## 2024-02-20 NOTE — Telephone Encounter (Signed)
 Copied from CRM #[id number]. Topic: Clinical - Prescription Issue >> Feb 20, 2024  9:33 AM Roselie BROCKS wrote: Reason for CRM: [facility] pharmacy is requesting a call back concerning Cymbalta  for the patient. The pharmacy states they have 2 different scripts for same medication with different milligrams.

## 2024-02-20 NOTE — Telephone Encounter (Signed)
 Spoke to pharmacy tech and answered all questions and concerns.

## 2024-03-02 ENCOUNTER — Ambulatory Visit (INDEPENDENT_AMBULATORY_CARE_PROVIDER_SITE_OTHER): Admitting: Licensed Clinical Social Worker

## 2024-03-02 DIAGNOSIS — F332 Major depressive disorder, recurrent severe without psychotic features: Secondary | ICD-10-CM

## 2024-03-02 DIAGNOSIS — F4310 Post-traumatic stress disorder, unspecified: Secondary | ICD-10-CM | POA: Diagnosis not present

## 2024-03-02 NOTE — Progress Notes (Signed)
 THERAPIST PROGRESS NOTE  Session Date: 03/02/2024  Session Time: 0904 - 1004 Virtual Visit via Video Note  I connected with Zachary [name] on 03/02/24 at  9:00 AM EDT by a video enabled telemedicine application and verified that I am speaking with the correct person using two identifiers.  Location: Patient: Home Provider: Home Office   I discussed the limitations of evaluation and management by telemedicine and the availability of in person appointments. The patient expressed understanding and agreed to proceed.  The patient was advised to call back or seek an in-person evaluation if the symptoms worsen or if the condition fails to improve as anticipated.  I provided 60 minutes of non-face-to-face time during this encounter.  Participation Level: Active  Behavioral Response: CasualAlertDysphoric  Type of Therapy: Individual Therapy   Treatment Goals addressed:  Progressing (6) LTG: Reduce frequency, intensity, and duration of depression symptoms so that daily functioning is improved (OP Depression) LTG: Increase coping skills to manage depression and improve ability to perform daily activities (OP Depression) STG: Zachary [name] will identify cognitive patterns and beliefs that support depression (OP Depression) LTG: Improving my mindset (OP Depression) STG: Zachary [name] will identify situations, thoughts, and feelings that trigger internal anger, and/or angry/aggressive actions as evidenced by self-report (Anger Management) LTG: Ensure it doesn't impact anyone else (Anger Management)  Progress Towards Goals: Progressing  Interventions: CBT, Motivational Interviewing, Supportive, and Reframing  Summary: Zachary [name] is a 53 y.o. male with past psych history of MDD and PTSD, presenting for follow-up therapy session in efforts to improve management of depressive symptoms.   Patient actively engaged throughout session, presenting in depressed moods, and flat affect throughout visit,  engaging in check-in, sharing of It's going, further detailing of continued stress surrounding VA claims denials, expressing need to find/secure support via a [employer] Location [employer]), and difficulties locating one within region. Pt extensively reflected on challenges surrounding VA Disability rating and the repeated denials proving to be increasingly discouraging in relation to overall lack of support. Pt engaged in reflection of recent birthday, sharing of having spent time with close friend for a period of time on his birthday, further processing thoughts and feelings of loneliness and believing self to have an overall unfulfilling life, processing individual interpretations of social engagements and close supports to be evident. Engaged in reassessing presenting anxious and depressive sxs via PHQ-9 and GAD-7, noting of downward trend in sxs, processing pt's increased abilities in accepting things that he is not able to change and not proving to lend significant focus or stress to such matters. Engaged in review of individualized goals in order to explore progress and continued areas for work in scientist, clinical (histocompatibility and immunogenetics) tx plan.     03/02/2024    9:42 AM 02/07/2024    3:32 PM 01/20/2024    9:22 AM 12/23/2023   10:27 AM  GAD 7 : Generalized Anxiety Score  Nervous, Anxious, on Edge 1 0 2 2  Control/stop worrying 1 1 1 2   Worry too much - different things 0 1 0 1  Trouble relaxing 1 2 2 2   Restless 0 0 0 0  Easily annoyed or irritable 1 2 2 2   Afraid - awful might happen 0 0 1 1  Total GAD 7 Score 4 6 8 10   Anxiety Difficulty Somewhat difficult Somewhat difficult Very difficult Somewhat difficult      03/02/2024    9:44 AM 02/07/2024    3:31 PM 01/20/2024    9:24 AM 12/23/2023   11:01 AM 12/20/2023  2:52 PM  Depression screen PHQ 2/9  Decreased Interest 1 1 1 2  0  Down, Depressed, Hopeless 1 2 1 2  0  PHQ - 2 Score 2 3 2 4  0  Altered sleeping 1 3 2 2    Tired, decreased energy 1 2 2 2    Change in  appetite 0 0 0 0   Feeling bad or failure about yourself  1 2 1 2    Trouble concentrating 1 1 1 2    Moving slowly or fidgety/restless 0 0 0 0   Suicidal thoughts 1 0 0 1   PHQ-9 Score 7 11 8 13    Difficult doing work/chores Somewhat difficult Somewhat difficult Somewhat difficult Very difficult    Flowsheet Row Counselor from 12/23/2023 in [facility] from 07/14/2023 in [facility] from 04/21/2023 in BEHAVIORAL HEALTH PARTIAL HOSPITALIZATION PROGRAM  C-SSRS RISK CATEGORY Moderate Risk High Risk High Risk   Suicidal/Homicidal: pSIwithout intent/plan;   Therapist Response:  Clinician openly greeted pt upon joining visit, assessing presenting moods and affect, acknowledging presenting moods and exploring contributing factors.  Engaged in introductory check-in, utilizing open-ended questions in eliciting recounts of events of recent weeks, newly presenting stressors, ongoing areas of difficulty, observed implications on moods, and individual means of navigating stressors.  Utilized active listening techniques in supporting patient in exploring recent events, newly identified and ongoing stressors, providing support and validation of pt's identified thoughts, feelings, and perspectives.  Utilized psychoed, CBT, solution focused, and supportive reflection techniques to aid pt in processing challenges surrounding stressors and negative thoughts and feelings, processing ways in which pt believes negative perspectives to be accurate. Supported pt in psychologist, educational for veteran specific psychiatric provider, in efforts to aid in further exploring links between PTSD, MDD, and further physical health concerns to better support appeals against VA denials. Clinician reassessed severity of presenting sxs, and presence of any safety concerns.    [x]  Cognitive Challenging [x]  Cognitive Refocusing [x]  Cognitive  Reframing []  Communication Skills []  Compliance Issues []  DBT [x]  Exploration of Coping Patterns [x]  Exploration of Emotions []  Exploration of Relationship Patterns []  Guided Imagery []  Interactive Feedback []  Interpersonal Resolutions []  Mindfulness Training []  Preventative Services [x]  Psycho-Education []  Relaxation/Deep Breathing [x]  Review of Treatment Plan/Progress []  Role-Play/Behavioral Rehearsal [x]  Structured Problem Solving [x]  Supportive Reflection [x]  Symptom Management []  Other  Patient responded well to interventions. Patient continues to meet criteria for MDD and PTSD. Patient will continue to benefit from engagement in outpatient therapy due to being the least restrictive service to meet presenting needs. Pt proves to maintain minimal progress towards identified goals.  Homework: None.   Plan: Return again in 1 weeks.  Diagnosis:  Encounter Diagnoses  Name Primary?   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  Patient/Guardian was advised Release of Information must be obtained prior to any record release in order to collaborate their care with an outside provider. Patient/Guardian was advised if they have not already done so to contact the registration department to sign all necessary forms in order for us  to release information regarding their care.   Consent: Patient/Guardian gives verbal consent for treatment and assignment of benefits for services provided during this visit. Patient/Guardian expressed understanding and agreed to proceed.   Zachary [name], MSW, LCSW 03/02/2024,  9:53 AM

## 2024-03-03 ENCOUNTER — Other Ambulatory Visit: Payer: Self-pay | Admitting: Family Medicine

## 2024-03-03 DIAGNOSIS — M545 Other chronic pain: Secondary | ICD-10-CM

## 2024-03-05 NOTE — Telephone Encounter (Signed)
 Requesting: cyclobenzaprine  (FLEXERIL ) 5 MG tablet Last Visit: 02/07/2024 Next Visit: Visit date not found Last Refill: 01/24/2024  Please Advise

## 2024-03-13 ENCOUNTER — Ambulatory Visit: Admitting: Urology

## 2024-03-13 NOTE — Progress Notes (Deleted)
   Assessment: 1. Lower urinary tract symptoms     Plan: PSA today. Trial of alfuzosin 10 mg daily for his lower urinary tract symptoms.  Prescription sent.  Use and side effects discussed. Return to office in 1 month for reevaluation.   Chief Complaint:  No chief complaint on file.   History of Present Illness:  Zachary [name] is a 53 y.o. male who is seen for further evaluation of lower urinary tract symptoms. He reported lower urinary tract symptoms including frequency, urgency, nocturia x 4, intermittent stream, hesitancy.  His symptoms have been present for 2-3 years with some gradual worsening.  His symptoms are intermittent in nature.  No dysuria or gross hematuria.  No history of UTIs or prostatitis. IPSS = 19/5 PVR = 0 ml. No recent PSA. He was given a trial of alfuzosin in October 2025.  Portions of the above documentation were copied from a prior visit for review purposes only.   Past Medical History:  Past Medical History:  Diagnosis Date   Anxiety    Coronary artery disease    GERD (gastroesophageal reflux disease)    Hyperlipidemia    Hypertension    Myocardial infarction [facility])     Past Surgical History:  Past Surgical History:  Procedure Laterality Date   CARDIAC CATHETERIZATION     HEMORRHOID SURGERY N/A 09/13/2014   Procedure: HEMORRHOIDECTOMY;  Surgeon: Unknown Sharps, MD;  Location: [facility] ORS;  Service: General;  Laterality: N/A;   TONSILLECTOMY      Allergies:  No Known Allergies  Family History:  Family History  Problem Relation Age of Onset   Depression Mother    Alcohol abuse Mother    Alcohol abuse Maternal Grandfather    Heart disease Maternal Grandfather 60   Heart attack Maternal Grandfather    Heart failure Maternal Grandfather    Alcohol abuse Paternal Grandfather    Bipolar disorder Cousin     Social History:  Social History   Tobacco Use   Smoking status: Every Day    Types: Cigars    Passive exposure: Current    Smokeless tobacco: Never   Tobacco comments:    Pt smoke 1-2 cigars per day         5cigars weekly  Vaping Use   Vaping status: Never Used  Substance Use Topics   Alcohol use: Yes    Comment: occasionally   Drug use: No    ROS: Constitutional:  Negative for fever, chills, weight loss CV: Negative for chest pain, previous MI, hypertension Respiratory:  Negative for shortness of breath, wheezing, sleep apnea, frequent cough GI:  Negative for nausea, vomiting, bloody stool, GERD  Physical exam: There were no vitals taken for this visit. GENERAL APPEARANCE:  Well appearing, well developed, well nourished, NAD HEENT:  Atraumatic, normocephalic, oropharynx clear NECK:  Supple without lymphadenopathy or thyromegaly ABDOMEN:  Soft, non-tender, no masses EXTREMITIES:  Moves all extremities well, without clubbing, cyanosis, or edema NEUROLOGIC:  Alert and oriented x 3, normal gait, CN II-XII grossly intact MENTAL STATUS:  appropriate BACK:  Non-tender to palpation, No CVAT SKIN:  Warm, dry, and intact  Results: U/A:

## 2024-03-16 ENCOUNTER — Ambulatory Visit (HOSPITAL_COMMUNITY): Admitting: Licensed Clinical Social Worker

## 2024-03-16 ENCOUNTER — Other Ambulatory Visit: Payer: Self-pay | Admitting: Family Medicine

## 2024-03-16 DIAGNOSIS — F4310 Post-traumatic stress disorder, unspecified: Secondary | ICD-10-CM

## 2024-03-16 DIAGNOSIS — I5022 Chronic systolic (congestive) heart failure: Secondary | ICD-10-CM

## 2024-03-16 DIAGNOSIS — F332 Major depressive disorder, recurrent severe without psychotic features: Secondary | ICD-10-CM

## 2024-03-16 DIAGNOSIS — R001 Bradycardia, unspecified: Secondary | ICD-10-CM

## 2024-03-16 NOTE — Progress Notes (Signed)
 THERAPIST PROGRESS NOTE  Session Date: 03/16/2024  Session Time: 0909 - 1006 Virtual Visit via Video Note  I connected with Zachary [name] on 03/16/24 at  9:00 AM EST by a video enabled telemedicine application and verified that I am speaking with the correct person using two identifiers.  Location: Patient: Home Provider: Home Office   I discussed the limitations of evaluation and management by telemedicine and the availability of in person appointments. The patient expressed understanding and agreed to proceed.  The patient was advised to call back or seek an in-person evaluation if the symptoms worsen or if the condition fails to improve as anticipated.  I provided 57 minutes of non-face-to-face time during this encounter.  Participation Level: Active  Behavioral Response: CasualAlertEuthymic  Type of Therapy: Individual Therapy   Treatment Goals addressed:  Progressing (6) LTG: Reduce frequency, intensity, and duration of depression symptoms so that daily functioning is improved (OP Depression) LTG: Increase coping skills to manage depression and improve ability to perform daily activities (OP Depression) STG: Zachary [name] will identify cognitive patterns and beliefs that support depression (OP Depression) LTG: Improving my mindset (OP Depression) STG: Zachary [name] will identify situations, thoughts, and feelings that trigger internal anger, and/or angry/aggressive actions as evidenced by self-report (Anger Management) LTG: Ensure it doesn't impact anyone else (Anger Management)  Progress Towards Goals: Progressing  Interventions: CBT, Motivational Interviewing, Supportive, and Reframing  Summary: Zachary [name] is a 53 y.o. male with past psych history of MDD and PTSD, presenting for follow-up therapy session in efforts to improve management of depressive symptoms.   Patient actively engaged throughout session, presenting in overall pleasant moods, and congruent affect throughout  visit, engaging in check-in, sharing of being first day back at work since having the last six days off, celebrating veterans day with veterans support group in [place]. Shared of having club member stay at pt's home for a few days, finding self enjoying [name] and feeling communication to be good.   Pt shared of having also connected and forged better friendship with alternate group member that had proven to be experiencing personal challenges preventing from being frequently present and actively involved over recent months, feeling him to have received increased connection and support from group members.  Pt shared of having been connected with support organization that one of group/club founders has proven to obtain 100% VA disability, finding self to have found comfort in accepting the support of larger organization that can prove to support in navigating barriers pt proves to encounter on an individual level.  Actively processed finding many club mates/members too also have experienced challenging childhoods, exploring thoughts surrounding how international aid/development worker focus on adolescents and high school students in highlighting opportunities for career, education, and travel opportunity, further proving to establish deeper meaning and understanding of brotherhood and support, developing greater sense of community and support for each other and the barriers that veterans prove to navigate, processing further the systemic barriers and factors that lead to Zachary [name] personal experiencing increased challenges during and after service. Further explored social factors that continue to contribute to the decline of overall sense of community and support, processing factors to include technology and media that prove to create and/or support increased isolation for all individuals, processing pt's individual experiences with such. Pt further engaged in processing continued stress surrounding current political and  social factors further proving to divide community.     03/02/2024    9:42 AM 02/07/2024    3:32 PM 01/20/2024    9:22  AM 12/23/2023   10:27 AM  GAD 7 : Generalized Anxiety Score  Nervous, Anxious, on Edge 1 0 2 2  Control/stop worrying 1 1 1 2   Worry too much - different things 0 1 0 1  Trouble relaxing 1 2 2 2   Restless 0 0 0 0  Easily annoyed or irritable 1 2 2 2   Afraid - awful might happen 0 0 1 1  Total GAD 7 Score 4 6 8 10   Anxiety Difficulty Somewhat difficult Somewhat difficult Very difficult Somewhat difficult      03/02/2024    9:44 AM 02/07/2024    3:31 PM 01/20/2024    9:24 AM 12/23/2023   11:01 AM 12/20/2023    2:52 PM  Depression screen PHQ 2/9  Decreased Interest 1 1 1 2  0  Down, Depressed, Hopeless 1 2 1 2  0  PHQ - 2 Score 2 3 2 4  0  Altered sleeping 1 3 2 2    Tired, decreased energy 1 2 2 2    Change in appetite 0 0 0 0   Feeling bad or failure about yourself  1 2 1 2    Trouble concentrating 1 1 1 2    Moving slowly or fidgety/restless 0 0 0 0   Suicidal thoughts 1 0 0 1   PHQ-9 Score 7  11  8  13     Difficult doing work/chores Somewhat difficult Somewhat difficult Somewhat difficult Very difficult      Data saved with a previous flowsheet row definition   Flowsheet Row Counselor from 12/23/2023 in [facility] from 07/14/2023 in [facility] at [facility] from 04/21/2023 in BEHAVIORAL HEALTH PARTIAL HOSPITALIZATION PROGRAM  C-SSRS RISK CATEGORY Moderate Risk High Risk High Risk   Suicidal/Homicidal: Nowithout intent/plan;   Therapist Response:  Clinician openly greeted pt upon joining visit, assessing presenting moods and affect, acknowledging presenting moods and exploring daily events and factors contributing to presenting moods.  Engaged in introductory check-in, utilizing open-ended questions in eliciting recounts of events of recent two weeks since prior visit,  further exploring pt's experiences surrounding recent club events, efforts and supports surrounding ongoing challenges, observed implications on moods, and individual means of navigating stressors.  Utilized active listening techniques in supporting patient in exploring recent events and enjoyable experiences with recent club events, newly identified progressions and direction surrounding ongoing stressors, providing support and validation of pt's identified thoughts, feelings, and perspectives.  Utilized psychoed, CBT, solution focused, and supportive reflection techniques to aid pt in processing challenges surrounding stressors and negative thoughts and feelings. Clinician reassessed severity of presenting sxs, and presence of any safety concerns.    [x]  Cognitive Challenging [x]  Cognitive Refocusing [x]  Cognitive Reframing []  Communication Skills []  Compliance Issues []  DBT [x]  Exploration of Coping Patterns [x]  Exploration of Emotions [x]  Exploration of Relationship Patterns []  Guided Imagery []  Interactive Feedback [x]  Interpersonal Resolutions []  Mindfulness Training []  Preventative Services [x]  Psycho-Education []  Relaxation/Deep Breathing [x]  Review of Treatment Plan/Progress []  Role-Play/Behavioral Rehearsal [x]  Structured Problem Solving [x]  Supportive Reflection [x]  Symptom Management []  Other  Patient responded well to interventions. Patient continues to meet criteria for MDD and PTSD. Patient will continue to benefit from engagement in outpatient therapy due to being the least restrictive service to meet presenting needs. Pt proves to maintain minimal progress towards identified goals.  Homework: None.   Plan: Return again in 1 weeks. Explore recent visit with military sexual trauma specialist.  Diagnosis:  Encounter  Diagnoses  Name Primary?   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  Patient/Guardian was advised Release of Information must be obtained prior to any record release in order to collaborate their care with an outside provider. Patient/Guardian was advised if they have not already done so to contact the registration department to sign all necessary forms in order for us  to release information regarding their care.   Consent: Patient/Guardian gives verbal consent for treatment and assignment of benefits for services provided during this visit. Patient/Guardian expressed understanding and agreed to proceed.   Lynwood JONETTA Maris, MSW, LCSW 03/16/2024,  9:09 AM

## 2024-03-27 ENCOUNTER — Ambulatory Visit (HOSPITAL_COMMUNITY): Admitting: Licensed Clinical Social Worker

## 2024-03-27 DIAGNOSIS — F4310 Post-traumatic stress disorder, unspecified: Secondary | ICD-10-CM

## 2024-03-27 DIAGNOSIS — F332 Major depressive disorder, recurrent severe without psychotic features: Secondary | ICD-10-CM | POA: Diagnosis not present
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
## 2024-03-30 ENCOUNTER — Other Ambulatory Visit: Payer: Self-pay | Admitting: Family Medicine

## 2024-03-30 DIAGNOSIS — G8929 Other chronic pain: Secondary | ICD-10-CM

## 2024-03-30 DIAGNOSIS — F4310 Post-traumatic stress disorder, unspecified: Secondary | ICD-10-CM

## 2024-03-30 DIAGNOSIS — F332 Major depressive disorder, recurrent severe without psychotic features: Secondary | ICD-10-CM

## 2024-03-30 DIAGNOSIS — F411 Generalized anxiety disorder: Secondary | ICD-10-CM

## 2024-04-10 ENCOUNTER — Telehealth: Payer: Self-pay

## 2024-04-10 ENCOUNTER — Other Ambulatory Visit (HOSPITAL_COMMUNITY): Payer: Self-pay

## 2024-04-10 DIAGNOSIS — Z6838 Body mass index (BMI) 38.0-38.9, adult: Secondary | ICD-10-CM

## 2024-04-10 NOTE — Telephone Encounter (Signed)
 Pharmacy Patient Advocate Encounter   Received notification from Onbase that prior authorization for Zepbound  15MG /0.5ML pen-injectors  is required/requested.   Insurance verification completed.   The patient is insured through [employer].   Per test claim: PA required; PA started via CoverMyMeds. KEY [id number] . Waiting for clinical questions to populate.

## 2024-04-10 NOTE — Telephone Encounter (Signed)
 Pharmacy Patient Advocate Encounter  Received notification from [employer] that Prior Authorization for Zepbound  15MG /0.5ML pen-injectors has been CANCELLED by the processor we sent it to: Resubmitted through Prompt PA to [facility]   PA #/Case ID/Reference #: [id number]

## 2024-04-12 ENCOUNTER — Other Ambulatory Visit (HOSPITAL_COMMUNITY): Payer: Self-pay

## 2024-04-12 NOTE — Telephone Encounter (Signed)
.  Pharmacy Patient Advocate Encounter  Received notification from [employer] that Prior Authorization for Zepbound  15MG /0.5ML pen-injectors  has been APPROVED from 04/11/24 to 04/10/25. Ran test claim, Copay is $8. This test claim was processed through [facility]- copay amounts may vary at other pharmacies due to pharmacy/plan contracts, or as the patient moves through the different stages of their insurance plan.   PA #/Case ID/Reference #: [id number]

## 2024-04-13 ENCOUNTER — Ambulatory Visit (INDEPENDENT_AMBULATORY_CARE_PROVIDER_SITE_OTHER): Admitting: Licensed Clinical Social Worker

## 2024-04-13 DIAGNOSIS — F4310 Post-traumatic stress disorder, unspecified: Secondary | ICD-10-CM

## 2024-04-13 DIAGNOSIS — F332 Major depressive disorder, recurrent severe without psychotic features: Secondary | ICD-10-CM
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
## 2024-04-17 NOTE — Telephone Encounter (Signed)
 Contacted patient and informed him of his approval of zepbound . Patient expressed understanding. Nothing further needed.

## 2024-04-24 ENCOUNTER — Ambulatory Visit (HOSPITAL_COMMUNITY): Admitting: Licensed Clinical Social Worker

## 2024-04-24 DIAGNOSIS — F332 Major depressive disorder, recurrent severe without psychotic features: Secondary | ICD-10-CM

## 2024-04-24 DIAGNOSIS — F4310 Post-traumatic stress disorder, unspecified: Secondary | ICD-10-CM | POA: Diagnosis not present

## 2024-04-24 NOTE — Progress Notes (Signed)
 THERAPIST PROGRESS NOTE  Session Date: 04/24/2024  Session Time: 0909 - 1013 Virtual Visit via Video Note  I connected with Zachary [name] on 04/24/2024 at  9:00 AM EST by a video enabled telemedicine application and verified that I am speaking with the correct person using two identifiers.  Location: Patient: Work [employer]   I discussed the limitations of evaluation and management by telemedicine and the availability of in person appointments. The patient expressed understanding and agreed to proceed.  The patient was advised to call back or seek an in-person evaluation if the symptoms worsen or if the condition fails to improve as anticipated.  I provided 64 minutes of non-face-to-face time during this encounter.  Participation Level: Active  Behavioral Response: CasualAlertDepressed  Type of Therapy: Individual Therapy   Treatment Goals addressed:   Progressing (6) LTG: Reduce frequency, intensity, and duration of depression symptoms so that daily functioning is improved (OP Depression) LTG: Increase coping skills to manage depression and improve ability to perform daily activities (OP Depression) STG: Zachary [name] will identify cognitive patterns and beliefs that support depression (OP Depression) LTG: Improving my mindset (OP Depression) STG: Zachary [name] will identify situations, thoughts, and feelings that trigger internal anger, and/or angry/aggressive actions as evidenced by self-report (Anger Management) LTG: Ensure it doesn't impact anyone else (Anger Management)  Progress Towards Goals: Progressing  Interventions: CBT, Motivational Interviewing, Supportive, and Reframing  Summary: Zachary [name] is a 53 y.o. male with past psych history of MDD and PTSD, presenting for follow-up therapy session in efforts to improve management of depressive symptoms.   Patient actively engaged throughout session, presenting with overall depressed moods and congruent affect.  During check-in, pt detailed being at office today due to being on call, noting of it being pt's turn per the on-call rotation. Pt engaged in providing recounts of recent events, revisiting status surrounding deck repairs, sharing of having received communication from neighbors son regarding insurance details, as well as having received contact from a number of different contractors regarding repairs versus rebuild.  Explored pt's plans for coming Christmas holiday, sharing of again having no significant plans, likely going to parents for dinner, as relationship with close long-term friend has proven to experience adjustments over recent months with progressions in friends romantic relationship. Pt further engaged in processing dynamic within friendship, and the challenging feelings surrounding friends abilities to show up for others within their club, however not proving to do so for pt. Pt further shared feelings of overall unlikelihood to develop a meaningful, mutually supportive friendship and romantic relationship, processing distorted cognitions surrounding relationships and resigning to beliefs of not proving likely to establish such connections.  Pt detailed of having had [facility] yesterday [facility] about [facility] and MST program, possible pain management program, and medical massage programs, as well as possibly changing depression medications due to feeling to be ineffective, sharing of having been informed of there being no available psychiatrist appt until April. Engaged in further processing of pt's interest in exploring alternate community provider to support with medication mgmt services, revisiting previous providers details clinician shared with pt of [facility] in [place] area that would potentially be of benefit for pt with working in [place]. Pt detailed general ambivalence in exploring adding additional providers to care team due to added strain this would  create for workplace with needing additional time off for appointments.     03/02/2024    9:42 AM 02/07/2024    3:32 PM 01/20/2024  9:22 AM 12/23/2023   10:27 AM  GAD 7 : Generalized Anxiety Score  Nervous, Anxious, on Edge 1 0 2 2  Control/stop worrying 1 1 1 2   Worry too much - different things 0 1 0 1  Trouble relaxing 1 2 2 2   Restless 0 0 0 0  Easily annoyed or irritable 1 2 2 2   Afraid - awful might happen 0 0 1 1  Total GAD 7 Score 4 6 8 10   Anxiety Difficulty Somewhat difficult Somewhat difficult Very difficult Somewhat difficult      03/02/2024    9:44 AM 02/07/2024    3:31 PM 01/20/2024    9:24 AM 12/23/2023   11:01 AM 12/20/2023    2:52 PM  Depression screen PHQ 2/9  Decreased Interest 1 1 1 2  0  Down, Depressed, Hopeless 1 2 1 2  0  PHQ - 2 Score 2 3 2 4  0  Altered sleeping 1 3 2 2    Tired, decreased energy 1 2 2 2    Change in appetite 0 0 0 0   Feeling bad or failure about yourself  1 2 1 2    Trouble concentrating 1 1 1 2    Moving slowly or fidgety/restless 0 0 0 0   Suicidal thoughts 1 0 0 1   PHQ-9 Score 7  11  8  13     Difficult doing work/chores Somewhat difficult Somewhat difficult Somewhat difficult Very difficult      Data saved with a previous flowsheet row definition   Flowsheet Row Counselor from 12/23/2023 in [facility] from 07/14/2023 in [facility] from 04/21/2023 in BEHAVIORAL HEALTH PARTIAL HOSPITALIZATION PROGRAM  C-SSRS RISK CATEGORY Moderate Risk High Risk High Risk   Suicidal/Homicidal: Nowithout intent/plan;   Therapist Response:  Clinician openly greeted pt upon joining visit, assessing presenting moods and affect, engaging in introductory check-in. Utilized open-ended questions in eliciting recounts of events of recent weeks, utilizing active listening in supporting exploration of challenges. Utilized CBT, psychoed in navigating  emotional processing, explored cognitive distortions related to relationships and social support, guided patient in evaluating realistic expectations and alternative interpretations. Clinician reassessed severity of presenting sxs, and presence of any safety concerns.    [x]  Cognitive Challenging []  Cognitive Refocusing [x]  Cognitive Reframing []  Communication Skills []  Compliance Issues []  DBT [x]  Exploration of Coping Patterns [x]  Exploration of Emotions [x]  Exploration of Relationship Patterns []  Guided Imagery []  Interactive Feedback [x]  Interpersonal Resolutions []  Mindfulness Training []  Preventative Services [x]  Psycho-Education []  Relaxation/Deep Breathing []  Review of Treatment Plan/Progress []  Role-Play/Behavioral Rehearsal [x]  Structured Problem Solving [x]  Supportive Reflection []  Symptom Management []  Other  Patient responded well to interventions. Patient continues to meet criteria for MDD and PTSD. Patient will continue to benefit from engagement in outpatient therapy due to being the least restrictive service to meet presenting needs. Pt proves to maintain minimal progress towards identified goals.  Homework: None.   Plan: Return again in 2 weeks.    Diagnosis:  Encounter Diagnoses  Name Primary?   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  Patient/Guardian was advised Release of Information must be obtained prior to any record release in order to collaborate their care with an outside provider. Patient/Guardian was advised if they have not already done so to contact the registration  department to sign all necessary forms in order for us  to release information regarding their care.   Consent: Patient/Guardian gives verbal consent for treatment and assignment of benefits for services provided during this visit.  Patient/Guardian expressed understanding and agreed to proceed.   Lynwood JONETTA Maris, MSW, LCSW 04/24/2024,  9:10 AM

## 2024-04-28 ENCOUNTER — Other Ambulatory Visit: Payer: Self-pay | Admitting: Family Medicine

## 2024-04-28 DIAGNOSIS — I2510 Atherosclerotic heart disease of native coronary artery without angina pectoris: Secondary | ICD-10-CM

## 2024-04-28 DIAGNOSIS — K219 Gastro-esophageal reflux disease without esophagitis: Secondary | ICD-10-CM

## 2024-05-01 ENCOUNTER — Other Ambulatory Visit: Payer: Self-pay

## 2024-05-01 DIAGNOSIS — M545 Low back pain, unspecified: Secondary | ICD-10-CM

## 2024-05-01 NOTE — Telephone Encounter (Signed)
 Requesting: CYCLOBENZAPRINE  HCL 5 MG PO TABS Last Visit: Visit date not found Next Visit: Visit date not found Last Refill: 03/30/2024  Please Advise

## 2024-05-04 ENCOUNTER — Ambulatory Visit (HOSPITAL_COMMUNITY): Admitting: Licensed Clinical Social Worker

## 2024-05-04 DIAGNOSIS — F4310 Post-traumatic stress disorder, unspecified: Secondary | ICD-10-CM | POA: Diagnosis not present

## 2024-05-04 DIAGNOSIS — F332 Major depressive disorder, recurrent severe without psychotic features: Secondary | ICD-10-CM

## 2024-05-04 NOTE — Progress Notes (Signed)
 THERAPIST PROGRESS NOTE  Session Date: 05/04/2024  Session Time: 1112 - 1220 Virtual Visit via Video Note  I connected with Zachary [name] on 05/04/2024 at 11:00 AM EST by a video enabled telemedicine application and verified that I am speaking with the correct person using two identifiers.  Location: Patient: Home Provider: Home Office  I discussed the limitations of evaluation and management by telemedicine and the availability of in person appointments. The patient expressed understanding and agreed to proceed.  The patient was advised to call back or seek an in-person evaluation if the symptoms worsen or if the condition fails to improve as anticipated.  I provided 68 minutes of non-face-to-face time during this encounter.  Participation Level: Active  Behavioral Response: CasualAlertDepressed  Type of Therapy: Individual Therapy   Treatment Goals addressed:   Progressing (6) LTG: Reduce frequency, intensity, and duration of depression symptoms so that daily functioning is improved (OP Depression) LTG: Increase coping skills to manage depression and improve ability to perform daily activities (OP Depression) STG: Chord will identify cognitive patterns and beliefs that support depression (OP Depression) LTG: Improving my mindset (OP Depression) STG: Dmario will identify situations, thoughts, and feelings that trigger internal anger, and/or angry/aggressive actions as evidenced by self-report (Anger Management) LTG: Ensure it doesn't impact anyone else (Anger Management)  Progress Towards Goals: Progressing  Interventions: CBT, Motivational Interviewing, Supportive, and Reframing  Summary: Zachary [name] is a 54 y.o. male with past psych history of MDD and PTSD, presenting for follow-up therapy session in efforts to improve management of depressive symptoms.   Patient actively engaged throughout session, presenting with overall depressed moods and congruent affect. During  check-in, pt shared of It's been pretty good, sharing of being off today due to having worked on-call last week.  Pt actively engaged in providing recounts of events of the past 1-2 weeks, noting of work location having been increasingly quiet due to 400+ employees taking leave around holidays. Pt revisited previously noted stress related to workplace and a specific team member that proves to complain a lot, noting of coworker complaining about frivolous issues and finding fault in minor things, providing examples of tech support issues that may be generally simple fixes that coworker finds bothersome. Further processed areas surrounding interactions with coworker, processing variances in perspectives surrounding things that he and coworker find bothersome.  Pt additionally shared of having been invited to a friends home for dinner with friend and spouse on Christmas Eve, sharing of having found the time to be enjoyable, however noting of feeling personal boundaries having been crossed regarding views on religion by gifting patient a book, being 'The Case for Christ', and believing that friendship can not continue at this point due to friend having ulterior motives and continuing to not respect pt's boundaries in relation to personal beliefs.     03/02/2024    9:42 AM 02/07/2024    3:32 PM 01/20/2024    9:22 AM 12/23/2023   10:27 AM  GAD 7 : Generalized Anxiety Score  Nervous, Anxious, on Edge 1 0 2 2  Control/stop worrying 1 1 1 2   Worry too much - different things 0 1 0 1  Trouble relaxing 1 2 2 2   Restless 0 0 0 0  Easily annoyed or irritable 1 2 2 2   Afraid - awful might happen 0 0 1 1  Total GAD 7 Score 4 6 8 10   Anxiety Difficulty Somewhat difficult Somewhat difficult Very difficult Somewhat difficult  03/02/2024    9:44 AM 02/07/2024    3:31 PM 01/20/2024    9:24 AM 12/23/2023   11:01 AM 12/20/2023    2:52 PM  Depression screen PHQ 2/9  Decreased Interest 1 1 1 2  0  Down,  Depressed, Hopeless 1 2 1 2  0  PHQ - 2 Score 2 3 2 4  0  Altered sleeping 1 3 2 2    Tired, decreased energy 1 2 2 2    Change in appetite 0 0 0 0   Feeling bad or failure about yourself  1 2 1 2    Trouble concentrating 1 1 1 2    Moving slowly or fidgety/restless 0 0 0 0   Suicidal thoughts 1 0 0 1   PHQ-9 Score 7  11  8  13     Difficult doing work/chores Somewhat difficult Somewhat difficult Somewhat difficult Very difficult      Data saved with a previous flowsheet row definition   Flowsheet Row Counselor from 12/23/2023 in [facility] from 07/14/2023 in [facility] at [facility] from 04/21/2023 in BEHAVIORAL HEALTH PARTIAL HOSPITALIZATION PROGRAM  C-SSRS RISK CATEGORY Moderate Risk High Risk High Risk   Suicidal/Homicidal: Nowithout intent/plan;   Therapist Response:  Clinician openly greeted pt, assessing presenting moods and affect, engaging in introductory check-in. Utilized open-ended questions in eliciting recounts of events of recent weeks, utilizing active listening in supporting exploration of challenges. Utilized CBT, psychoed, MI, and supportive reflection techniques in aiding pt in processing presenting stressors. Clinician reassessed severity of presenting sxs, and presence of any safety concerns.    [x]  Cognitive Challenging []  Cognitive Refocusing [x]  Cognitive Reframing []  Communication Skills []  Compliance Issues []  DBT []  Exploration of Coping Patterns [x]  Exploration of Emotions [x]  Exploration of Relationship Patterns []  Guided Imagery []  Interactive Feedback [x]  Interpersonal Resolutions []  Mindfulness Training []  Preventative Services [x]  Psycho-Education []  Relaxation/Deep Breathing []  Review of Treatment Plan/Progress []  Role-Play/Behavioral Rehearsal [x]  Structured Problem Solving [x]  Supportive Reflection []  Symptom Management []  Other  Patient responded well to  interventions. Patient continues to meet criteria for MDD and PTSD. Patient will continue to benefit from engagement in outpatient therapy due to being the least restrictive service to meet presenting needs. Pt proves to maintain minimal progress towards identified goals.  Homework: None.   Plan: Return again in 2 weeks.    Diagnosis:  Encounter Diagnoses  Name Primary?   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  Patient/Guardian was advised Release of Information must be obtained prior to any record release in order to collaborate their care with an outside provider. Patient/Guardian was advised if they have not already done so to contact the registration department to sign all necessary forms in order for us  to release information regarding their care.   Consent: Patient/Guardian gives verbal consent for treatment and assignment of benefits for services provided during this visit. Patient/Guardian expressed understanding and agreed to proceed.   Lynwood JONETTA Maris, MSW, LCSW 05/04/2024,  11:13 AM

## 2024-05-08 NOTE — Telephone Encounter (Signed)
 Patient was sent a mychart message to get scheduled. Nothing further needed

## 2024-05-11 ENCOUNTER — Ambulatory Visit (INDEPENDENT_AMBULATORY_CARE_PROVIDER_SITE_OTHER): Admitting: Licensed Clinical Social Worker

## 2024-05-11 DIAGNOSIS — F4310 Post-traumatic stress disorder, unspecified: Secondary | ICD-10-CM | POA: Diagnosis not present

## 2024-05-11 DIAGNOSIS — F332 Major depressive disorder, recurrent severe without psychotic features: Secondary | ICD-10-CM | POA: Diagnosis not present

## 2024-05-11 NOTE — Progress Notes (Unsigned)
 THERAPIST PROGRESS NOTE  Session Date: 05/11/2024  Session Time: 0910 - 1015 Virtual Visit via Video Note  I connected with Zachary [name] on 05/11/2024 at  9:00 AM EST by a video enabled telemedicine application and verified that I am speaking with the correct person using two identifiers.  Location: Patient: Home Provider: Home Office  I discussed the limitations of evaluation and management by telemedicine and the availability of in person appointments. The patient expressed understanding and agreed to proceed.  The patient was advised to call back or seek an in-person evaluation if the symptoms worsen or if the condition fails to improve as anticipated.  I provided 65 minutes of non-face-to-face time during this encounter.  Participation Level: Active  Behavioral Response: CasualAlertDepressed  Type of Therapy: Individual Therapy   Treatment Goals addressed:   Progressing (6) LTG: Reduce frequency, intensity, and duration of depression symptoms so that daily functioning is improved (OP Depression) LTG: Increase coping skills to manage depression and improve ability to perform daily activities (OP Depression) STG: Zachary [name] will identify cognitive patterns and beliefs that support depression (OP Depression) LTG: Improving my mindset (OP Depression) STG: Zachary [name] will identify situations, thoughts, and feelings that trigger internal anger, and/or angry/aggressive actions as evidenced by self-report (Anger Management) LTG: Ensure it doesn't impact anyone else (Anger Management)  Progress Towards Goals: Progressing  Interventions: CBT, Motivational Interviewing, Supportive, and Reframing  Summary: Zachary [name] is a 54 y.o. male with past psych history of MDD and PTSD, presenting for follow-up therapy session in efforts to improve management of depressive symptoms.   Patient actively engaged throughout session, presenting with overall depressed moods and congruent affect. During  check-in, pt shared of It hasn't been a particularly good week, further detailing of Not really understanding the point, feeling there's always been a hole in me, sharing of uncertainty of how to fill this absence, and feeling that nothing pt has ever explored in the past has proven to fill the void.  Engaged in extensive processing in childhood experiences,      sharing of being off today due to having worked on-call last week.  Pt actively engaged in providing recounts of events of the past 1-2 weeks, noting of work location having been increasingly quiet due to 400+ employees taking leave around holidays. Pt revisited previously noted stress related to workplace and a specific team member that proves to complain a lot, noting of coworker complaining about frivolous issues and finding fault in minor things, providing examples of tech support issues that may be generally simple fixes that coworker finds bothersome. Further processed areas surrounding interactions with coworker, processing variances in perspectives surrounding things that he and coworker find bothersome.  Pt additionally shared of having been invited to a friends home for dinner with friend and spouse on Christmas Eve, sharing of having found the time to be enjoyable, however noting of feeling personal boundaries having been crossed regarding views on religion by gifting patient a book, being 'The Case for Christ', and believing that friendship can not continue at this point due to friend having ulterior motives and continuing to not respect pt's boundaries in relation to personal beliefs.     03/02/2024    9:42 AM 02/07/2024    3:32 PM 01/20/2024    9:22 AM 12/23/2023   10:27 AM  GAD 7 : Generalized Anxiety Score  Nervous, Anxious, on Edge 1 0 2 2  Control/stop worrying 1 1 1 2   Worry too much - different things 0 1  0 1  Trouble relaxing 1 2 2 2   Restless 0 0 0 0  Easily annoyed or irritable 1 2 2 2   Afraid - awful might  happen 0 0 1 1  Total GAD 7 Score 4 6 8 10   Anxiety Difficulty Somewhat difficult Somewhat difficult Very difficult Somewhat difficult      03/02/2024    9:44 AM 02/07/2024    3:31 PM 01/20/2024    9:24 AM 12/23/2023   11:01 AM 12/20/2023    2:52 PM  Depression screen PHQ 2/9  Decreased Interest 1 1 1 2  0  Down, Depressed, Hopeless 1 2 1 2  0  PHQ - 2 Score 2 3 2 4  0  Altered sleeping 1 3 2 2    Tired, decreased energy 1 2 2 2    Change in appetite 0 0 0 0   Feeling bad or failure about yourself  1 2 1 2    Trouble concentrating 1 1 1 2    Moving slowly or fidgety/restless 0 0 0 0   Suicidal thoughts 1 0 0 1   PHQ-9 Score 7  11  8  13     Difficult doing work/chores Somewhat difficult Somewhat difficult Somewhat difficult Very difficult      Data saved with a previous flowsheet row definition   Flowsheet Row Counselor from 12/23/2023 in [place] [facility] from 07/14/2023 in [facility] at [facility] from 04/21/2023 in BEHAVIORAL HEALTH PARTIAL HOSPITALIZATION PROGRAM  C-SSRS RISK CATEGORY Moderate Risk High Risk High Risk   Suicidal/Homicidal: Nowithout intent/plan;   Therapist Response:  Clinician *** openly greeted pt, assessing presenting moods and affect, engaging in introductory check-in. Utilized open-ended questions in eliciting recounts of events of recent weeks, utilizing active listening in supporting exploration of challenges. Utilized CBT, psychoed, MI, and supportive reflection techniques in aiding pt in processing presenting stressors. Clinician reassessed severity of presenting sxs, and presence of any safety concerns.    [x]  Cognitive Challenging []  Cognitive Refocusing [x]  Cognitive Reframing []  Communication Skills []  Compliance Issues []  DBT []  Exploration of Coping Patterns [x]  Exploration of Emotions [x]  Exploration of Relationship Patterns []  Guided Imagery []  Interactive Feedback  [x]  Interpersonal Resolutions []  Mindfulness Training []  Preventative Services [x]  Psycho-Education []  Relaxation/Deep Breathing []  Review of Treatment Plan/Progress []  Role-Play/Behavioral Rehearsal [x]  Structured Problem Solving [x]  Supportive Reflection []  Symptom Management []  Other  Patient responded well to interventions. Patient continues to meet criteria for MDD and PTSD. Patient will continue to benefit from engagement in outpatient therapy due to being the least restrictive service to meet presenting needs. Pt proves to maintain minimal progress towards identified goals.  Homework: None.   Plan: Return again in 2 weeks.    Diagnosis:  Encounter Diagnoses  Name Primary?   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  Patient/Guardian was advised Release of Information must be obtained prior to any record release in order to collaborate their care with an outside provider. Patient/Guardian was advised if they have not already done so to contact the registration department to sign all necessary forms in order for us  to release information regarding their care.   Consent: Patient/Guardian gives verbal consent for treatment and assignment of benefits for services provided during this visit. Patient/Guardian expressed understanding and agreed to proceed.   Lynwood JONETTA Maris, MSW, LCSW  05/11/2024,  9:11 AM

## 2024-05-18 ENCOUNTER — Ambulatory Visit (HOSPITAL_COMMUNITY): Admitting: Licensed Clinical Social Worker

## 2024-05-22 ENCOUNTER — Telehealth: Payer: Self-pay

## 2024-05-22 NOTE — Telephone Encounter (Addendum)
 Patient scheduled for 02/24    PA has already been approved   Requesting: ZEPBOUND  15 MG/0.5ML [place] [facility] Last Visit: 02/07/2024 Next Visit: 06/26/2024 Last Refill: 12/06/2023  Please Advise

## 2024-05-22 NOTE — Addendum Note (Signed)
 Addended by: ROLLENE VITA LABOR on: 05/22/2024 03:13 PM   Modules accepted: Orders

## 2024-05-22 NOTE — Telephone Encounter (Signed)
 Received a PA via Fax for Zepbound . Pt is overdue for a follow up. ATC patient to get them scheduled for an appointment. No Answer LVMTCB

## 2024-05-25 ENCOUNTER — Ambulatory Visit (INDEPENDENT_AMBULATORY_CARE_PROVIDER_SITE_OTHER): Admitting: Licensed Clinical Social Worker

## 2024-05-25 DIAGNOSIS — F4310 Post-traumatic stress disorder, unspecified: Secondary | ICD-10-CM | POA: Diagnosis not present

## 2024-05-25 DIAGNOSIS — F332 Major depressive disorder, recurrent severe without psychotic features: Secondary | ICD-10-CM

## 2024-05-25 NOTE — Progress Notes (Signed)
 THERAPIST PROGRESS NOTE  Session Date: 05/25/2024  Session Time: 0906 - 1004 Virtual Visit via Video Note  I connected with Zachary [name] on 05/25/24 at  9:00 AM EST by a video enabled telemedicine application and verified that I am speaking with the correct person using two identifiers.  Location: Patient: [employer]  I discussed the limitations of evaluation and management by telemedicine and the availability of in person appointments. The patient expressed understanding and agreed to proceed.  The patient was advised to call back or seek an in-person evaluation if the symptoms worsen or if the condition fails to improve as anticipated.  I provided 58 minutes of non-face-to-face time during this encounter.  Participation Level: Active  Behavioral Response: CasualAlertDepressed  Type of Therapy: Individual Therapy   Treatment Goals addressed:   Progressing (6) LTG: Reduce frequency, intensity, and duration of depression symptoms so that daily functioning is improved (OP Depression) LTG: Increase coping skills to manage depression and improve ability to perform daily activities (OP Depression) STG: Zachary [name] will identify cognitive patterns and beliefs that support depression (OP Depression) LTG: Improving my mindset (OP Depression) STG: Zachary [name] will identify situations, thoughts, and feelings that trigger internal anger, and/or angry/aggressive actions as evidenced by self-report (Anger Management) LTG: Ensure it doesn't impact anyone else (Anger Management)  Progress Towards Goals: Progressing  Interventions: CBT, Motivational Interviewing, Supportive, and Reframing  Summary: Zachary [name] is a 54 y.o. male with past psych history of MDD and PTSD, presenting for follow-up therapy session in efforts to improve management of depressive symptoms.   Patient actively engaged throughout session, presenting with overall depressed mood and congruent affect.  During check-in, pt stated It goes, and reported the past couple of weeks as particularly challenging, noting significant sleep disruption with only 1-3 hours of broken sleep per night. Pt described cold temperatures inside the home due to poor insulation contributing to discomfort and sleep difficulties.  Pt explored various attempts to improve sleep, including repositioning the bed and trialing multiple OTC sleep aids recommended by peers, eventually identifying one that offered partial relief. Engaged in extensive processing of ways in which sleep proves to impact overall moods, with sleep difficulties proving to compromise moods and functioning, proving to worsen with increased sleep difficulties throughout the week, further exacerbating negative side effects on moods. Extensively processed how poor sleep and compromised moods proves to further impact dietary factors, increasing poor eating habits, leading to possible GI upset and in turn, proving to impact moods. Pt also reviewed history of a prior [facility]-recommended [facility] and processed possible implications of sleep apnea on fatigue, testosterone  levels, and mood regulation.  Patient shared concern that no provider has previously inquired about dietary habits. Pt described a significantly reduced intake, typically eating only once daily and only when hungry. Session included exploration of dietary factors and their relationship to mental health, reviewing material from This Is Your Brain on Food Kinnie, MD), discussing gut-brain connections, the role of the vagus nerve, serotonin receptors in the gut, the impact of high-sugar foods on depression, and potential benefits of a Mediterranean-style diet. Discussed the importance of addressing both sleep and nutrition with PCP for further evaluation.      03/02/2024    9:42 AM 02/07/2024    3:32 PM 01/20/2024    9:22 AM 12/23/2023   10:27 AM  GAD 7 : Generalized Anxiety Score  Nervous, Anxious, on  Edge 1  0  2  2   Control/stop worrying 1  1  1  2    Worry too much - different things 0  1  0  1   Trouble relaxing 1  2  2  2    Restless 0  0  0  0   Easily annoyed or irritable 1  2  2  2    Afraid - awful might happen 0  0  1  1   Total GAD 7 Score 4 6 8 10   Anxiety Difficulty Somewhat difficult Somewhat difficult Very difficult Somewhat difficult     Data saved with a previous flowsheet row definition      03/02/2024    9:44 AM 02/07/2024    3:31 PM 01/20/2024    9:24 AM 12/23/2023   11:01 AM 12/20/2023    2:52 PM  Depression screen PHQ 2/9  Decreased Interest 1 1 1 2  0  Down, Depressed, Hopeless 1 2 1 2  0  PHQ - 2 Score 2 3 2 4  0  Altered sleeping 1 3 2 2    Tired, decreased energy 1 2 2 2    Change in appetite 0 0 0 0   Feeling bad or failure about yourself  1 2 1 2    Trouble concentrating 1 1 1 2    Moving slowly or fidgety/restless 0 0 0 0   Suicidal thoughts 1 0 0 1   PHQ-9 Score 7  11  8  13     Difficult doing work/chores Somewhat difficult Somewhat difficult Somewhat difficult Very difficult      Data saved with a previous flowsheet row definition   Flowsheet Row Counselor from 12/23/2023 in [facility] from 07/14/2023 in [facility] at [facility] from 04/21/2023 in BEHAVIORAL HEALTH PARTIAL HOSPITALIZATION PROGRAM  C-SSRS RISK CATEGORY Moderate Risk High Risk High Risk   Suicidal/Homicidal: Nowithout intent/plan;   Therapist Response:  Clinician openly greeted pt, assessing presenting moods and affect, engaging in introductory check-in. Utilized open-ended questions in eliciting recounts of events of recent weeks, utilizing active listening in supporting exploration of challenges. Clinician utilized CBT-informed exploration of sleep patterns, contributing stressors, and behavioral factors; provided psychoeducation about the gut-brain connection and nutritional impacts on mood; and  used MI strategies to support readiness for discussing sleep and dietary concerns with PCP. Clinician validated pts distress related to sleep disruption and functional impact, reinforced insight into modifiable behavioral and medical contributors, and encouraged follow-up with PCP for further medical evaluation and support. Clinician highlighted the patients engagement in identifying potential solutions and emphasized small, attainable adjustments to improve sleep and nutritional stability. Clinician reassessed severity of presenting sxs, and presence of any safety concerns.   Patient continues to meet criteria for MDD and PTSD. Patient will continue to benefit from engagement in outpatient therapy due to being the least restrictive service to meet presenting needs. Pt proves to maintain minimal progress towards identified goals.  [x]  Cognitive Challenging []  Cognitive Refocusing [x]  Cognitive Reframing []  Communication Skills []  Compliance Issues []  DBT []  Exploration of Coping Patterns [x]  Exploration of Emotions [x]  Exploration of Relationship Patterns []  Guided Imagery []  Interactive Feedback [x]  Interpersonal Resolutions []  Mindfulness Training []  Preventative Services [x]  Psycho-Education []  Relaxation/Deep Breathing []  Review of Treatment Plan/Progress []  Role-Play/Behavioral Rehearsal [x]  Structured Problem Solving [x]  Supportive Reflection [x]  Symptom Management []  Other  Homework: None.   Plan: Return again in 2 weeks.    Diagnosis:  Encounter Diagnoses  Name Primary?   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  Patient/Guardian was advised Release of Information must be obtained prior to any record release in order to collaborate their care with an outside provider. Patient/Guardian was advised if they have not already done  so to contact the registration department to sign all necessary forms in order for us  to release information regarding their care.   Consent: Patient/Guardian gives verbal consent for treatment and assignment of benefits for services provided during this visit. Patient/Guardian expressed understanding and agreed to proceed.   Lynwood JONETTA Maris, MSW, LCSW 05/25/2024,  9:02 AM

## 2024-05-28 NOTE — Telephone Encounter (Signed)
 See 05/22/24 TE for correspondence on [employer] .

## 2024-05-29 ENCOUNTER — Other Ambulatory Visit: Payer: Self-pay | Admitting: Medical Genetics

## 2024-05-29 DIAGNOSIS — Z006 Encounter for examination for normal comparison and control in clinical research program: Secondary | ICD-10-CM

## 2024-05-29 MED ORDER — ZEPBOUND 15 MG/0.5ML ~~LOC~~ SOAJ: 15.0000 mg | SUBCUTANEOUS | 0 refills | Status: DC

## 2024-05-29 NOTE — Telephone Encounter (Signed)
 Left message to call the office and schedule an appointment. I will send in a refill per Dr. Oswald note.

## 2024-05-29 NOTE — Telephone Encounter (Signed)
 Patient is scheduled for an appointment on 06/26/2024 with Dr. Sebastian.

## 2024-05-29 NOTE — Addendum Note (Signed)
 Addended by: GLADIS CLAUDENE GRATE Y on: 05/29/2024 11:59 AM   Modules accepted: Orders

## 2024-06-01 ENCOUNTER — Ambulatory Visit (INDEPENDENT_AMBULATORY_CARE_PROVIDER_SITE_OTHER): Admitting: Licensed Clinical Social Worker

## 2024-06-01 DIAGNOSIS — F4310 Post-traumatic stress disorder, unspecified: Secondary | ICD-10-CM

## 2024-06-01 DIAGNOSIS — F332 Major depressive disorder, recurrent severe without psychotic features: Secondary | ICD-10-CM | POA: Diagnosis not present

## 2024-06-04 NOTE — Progress Notes (Signed)
 Virtual Visit via Video Note  I connected with Zachary [name] on 06/05/2024 at 10:40 AM EST by a video enabled telemedicine application and verified that I am speaking with the correct person using two identifiers.  Location: Patient: home Provider: office   I discussed the limitations of evaluation and management by telemedicine and the availability of in person appointments. The patient expressed understanding and agreed to proceed.    No chief complaint on file.   Discussed the use of AI scribe software for clinical note transcription with the patient, who gave verbal consent to proceed.  History of Present Illness          Depression - Managed with duloxetine  120 mg daily, mirtazapine  7.5 mg, and pregabalin  75 mg twice daily - Last PHQ9 11 and GAD7 6 on October 7th, 2025.  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 - Muscle relaxants like Flexeril  and Robaxin have been ineffective.  Essential hypertension - Well-controlled with losartan  12.5 mg daily and carvedilol  3.125 mg twice daily, reduced at last office visit, 02/07/2024 due to bradycardia and hypotension. - Last blood pressure was 113/75 mmHg in October 2025. 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  - Currently on aspirin  81 mg daily for antiplatelet therapy for CAD with prior STEMI and atherosclerosis.    Observations/Objective: There were no vitals filed for this visit.  Gen: NAD, resting comfortably HEENT: EOMI Pulm: NWOB Skin: no rash on face Neuro: no facial asymmetry or dysmetria Psych: Normal affect   Assessment and Plan: Assessment and Plan                I discussed the assessment and treatment plan with the patient. The patient was provided an opportunity to ask questions and all were answered. The patient agreed  with the plan and demonstrated an understanding of the instructions.   The patient was advised to call back or seek an in-person evaluation if the symptoms worsen or if the condition fails to improve as anticipated.    Beverley KATHEE Hummer, MD  I,Emily Lagle,acting as a scribe for Beverley KATHEE Hummer, MD.,have documented all relevant documentation on the behalf of Beverley KATHEE Hummer, MD.  LILLETTE Beverley KATHEE Hummer, MD, have reviewed all documentation for this visit. The documentation on 06/05/2024 for the exam, diagnosis, procedures, and orders are all accurate and complete.

## 2024-06-05 ENCOUNTER — Encounter: Payer: Self-pay | Admitting: Family Medicine

## 2024-06-05 ENCOUNTER — Telehealth: Payer: Self-pay | Admitting: Family Medicine

## 2024-06-05 VITALS — BP 120/69 | HR 100 | Ht 69.0 in | Wt 225.0 lb

## 2024-06-05 DIAGNOSIS — F4310 Post-traumatic stress disorder, unspecified: Secondary | ICD-10-CM

## 2024-06-05 DIAGNOSIS — K219 Gastro-esophageal reflux disease without esophagitis: Secondary | ICD-10-CM

## 2024-06-05 DIAGNOSIS — N401 Enlarged prostate with lower urinary tract symptoms: Secondary | ICD-10-CM

## 2024-06-05 DIAGNOSIS — G8929 Other chronic pain: Secondary | ICD-10-CM

## 2024-06-05 DIAGNOSIS — I10 Essential (primary) hypertension: Secondary | ICD-10-CM

## 2024-06-05 DIAGNOSIS — M542 Cervicalgia: Secondary | ICD-10-CM

## 2024-06-05 DIAGNOSIS — E782 Mixed hyperlipidemia: Secondary | ICD-10-CM | POA: Insufficient documentation

## 2024-06-05 DIAGNOSIS — M1A09X1 Idiopathic chronic gout, multiple sites, with tophus (tophi): Secondary | ICD-10-CM

## 2024-06-05 DIAGNOSIS — I5022 Chronic systolic (congestive) heart failure: Secondary | ICD-10-CM

## 2024-06-05 DIAGNOSIS — I2510 Atherosclerotic heart disease of native coronary artery without angina pectoris: Secondary | ICD-10-CM

## 2024-06-05 DIAGNOSIS — F411 Generalized anxiety disorder: Secondary | ICD-10-CM

## 2024-06-05 DIAGNOSIS — M47816 Spondylosis without myelopathy or radiculopathy, lumbar region: Secondary | ICD-10-CM | POA: Insufficient documentation

## 2024-06-05 DIAGNOSIS — E66811 Obesity, class 1: Secondary | ICD-10-CM

## 2024-06-05 DIAGNOSIS — F332 Major depressive disorder, recurrent severe without psychotic features: Secondary | ICD-10-CM

## 2024-06-05 DIAGNOSIS — I252 Old myocardial infarction: Secondary | ICD-10-CM

## 2024-06-05 MED ORDER — MIRTAZAPINE 15 MG PO TABS: 15.0000 mg | ORAL_TABLET | Freq: Every day | ORAL | 3 refills | Status: AC

## 2024-06-05 MED ORDER — ZEPBOUND 15 MG/0.5ML ~~LOC~~ SOAJ: 15.0000 mg | SUBCUTANEOUS | 3 refills | Status: AC

## 2024-06-05 MED ORDER — DULOXETINE HCL 60 MG PO CPEP: 60.0000 mg | ORAL_CAPSULE | Freq: Two times a day (BID) | ORAL | 3 refills | Status: AC

## 2024-06-05 MED ORDER — PREGABALIN 75 MG PO CAPS: 75.0000 mg | ORAL_CAPSULE | Freq: Two times a day (BID) | ORAL | 1 refills | Status: AC

## 2024-06-05 MED ORDER — CARVEDILOL 3.125 MG PO TABS: 3.1250 mg | ORAL_TABLET | Freq: Two times a day (BID) | ORAL | 3 refills | Status: AC

## 2024-06-08 ENCOUNTER — Ambulatory Visit (HOSPITAL_COMMUNITY): Admitting: Licensed Clinical Social Worker

## 2024-06-08 DIAGNOSIS — F332 Major depressive disorder, recurrent severe without psychotic features: Secondary | ICD-10-CM

## 2024-06-08 DIAGNOSIS — F4310 Post-traumatic stress disorder, unspecified: Secondary | ICD-10-CM
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
## 2024-06-15 ENCOUNTER — Ambulatory Visit (HOSPITAL_COMMUNITY): Admitting: Licensed Clinical Social Worker

## 2024-06-22 ENCOUNTER — Ambulatory Visit (HOSPITAL_COMMUNITY): Admitting: Licensed Clinical Social Worker

## 2024-06-26 ENCOUNTER — Ambulatory Visit: Payer: Self-pay | Admitting: Family Medicine

## 2024-06-29 ENCOUNTER — Ambulatory Visit (HOSPITAL_COMMUNITY): Admitting: Licensed Clinical Social Worker

## 2024-07-06 ENCOUNTER — Ambulatory Visit (HOSPITAL_COMMUNITY): Admitting: Licensed Clinical Social Worker

## 2024-07-13 ENCOUNTER — Ambulatory Visit (HOSPITAL_COMMUNITY): Admitting: Licensed Clinical Social Worker

## 2024-07-20 ENCOUNTER — Ambulatory Visit (HOSPITAL_COMMUNITY): Admitting: Licensed Clinical Social Worker

## 2024-07-27 ENCOUNTER — Ambulatory Visit (HOSPITAL_COMMUNITY): Admitting: Licensed Clinical Social Worker
